# Patient Record
Sex: Female | Born: 1950
Health system: Southern US, Community
[De-identification: ages and names within clinical notes are randomized; demographics above are authoritative.]

## PROBLEM LIST (undated history)

## (undated) DIAGNOSIS — I10 Essential (primary) hypertension: Secondary | ICD-10-CM

## (undated) HISTORY — DX: Essential (primary) hypertension: I10

---

## 2002-02-15 ENCOUNTER — Encounter: Payer: Self-pay | Admitting: Obstetrics and Gynecology

## 2002-02-15 ENCOUNTER — Encounter: Admission: RE | Admit: 2002-02-15 | Discharge: 2002-02-15 | Payer: Self-pay | Admitting: Obstetrics and Gynecology

## 2002-10-15 ENCOUNTER — Observation Stay (HOSPITAL_COMMUNITY): Admission: EM | Admit: 2002-10-15 | Discharge: 2002-10-16 | Payer: Self-pay | Admitting: Emergency Medicine

## 2002-10-15 ENCOUNTER — Encounter: Payer: Self-pay | Admitting: Emergency Medicine

## 2003-05-21 ENCOUNTER — Other Ambulatory Visit: Admission: RE | Admit: 2003-05-21 | Discharge: 2003-05-21 | Payer: Self-pay | Admitting: Obstetrics and Gynecology

## 2004-11-09 ENCOUNTER — Other Ambulatory Visit: Admission: RE | Admit: 2004-11-09 | Discharge: 2004-11-09 | Payer: Self-pay | Admitting: Obstetrics and Gynecology

## 2005-05-18 ENCOUNTER — Ambulatory Visit: Payer: Self-pay | Admitting: Internal Medicine

## 2005-11-16 ENCOUNTER — Ambulatory Visit: Payer: Self-pay | Admitting: Internal Medicine

## 2006-05-17 ENCOUNTER — Ambulatory Visit: Payer: Self-pay | Admitting: Internal Medicine

## 2006-10-30 ENCOUNTER — Ambulatory Visit: Payer: Self-pay | Admitting: Internal Medicine

## 2006-10-31 ENCOUNTER — Ambulatory Visit: Payer: Self-pay | Admitting: Internal Medicine

## 2006-10-31 LAB — CONVERTED CEMR LAB
ALT: 33 units/L (ref 0–40)
AST: 28 units/L (ref 0–37)
Albumin: 3.5 g/dL (ref 3.5–5.2)
Alkaline Phosphatase: 68 units/L (ref 39–117)
BUN: 12 mg/dL (ref 6–23)
Bacteria, UA: NEGATIVE
Basophils Absolute: 0 10*3/uL (ref 0.0–0.1)
Basophils Relative: 0.7 % (ref 0.0–1.0)
Bilirubin Urine: NEGATIVE
Bilirubin, Direct: 0.2 mg/dL (ref 0.0–0.3)
CO2: 27 meq/L (ref 19–32)
Calcium: 9.2 mg/dL (ref 8.4–10.5)
Chloride: 107 meq/L (ref 96–112)
Cholesterol: 278 mg/dL (ref 0–200)
Creatinine, Ser: 1 mg/dL (ref 0.4–1.2)
Crystals: NEGATIVE
Direct LDL: 185.8 mg/dL
Eosinophils Absolute: 0.1 10*3/uL (ref 0.0–0.6)
Eosinophils Relative: 1.1 % (ref 0.0–5.0)
Ferritin: 38.2 ng/mL (ref 10.0–291.0)
GFR calc Af Amer: 74 mL/min
GFR calc non Af Amer: 61 mL/min
Glucose, Bld: 101 mg/dL — ABNORMAL HIGH (ref 70–99)
HCT: 43.5 % (ref 36.0–46.0)
HDL: 56.4 mg/dL (ref >39.0)
Hemoglobin: 15 g/dL (ref 12.0–15.0)
Iron: 106 ug/dL (ref 42–145)
Ketones, ur: NEGATIVE mg/dL
Lymphocytes Relative: 43.8 % (ref 12.0–46.0)
MCHC: 34.6 g/dL (ref 30.0–36.0)
MCV: 85 fL (ref 78.0–100.0)
Monocytes Absolute: 0.5 10*3/uL (ref 0.2–0.7)
Monocytes Relative: 8.1 % (ref 3.0–11.0)
Mucus, UA: NEGATIVE
Neutro Abs: 2.9 10*3/uL (ref 1.4–7.7)
Neutrophils Relative %: 46.3 % (ref 43.0–77.0)
Nitrite: NEGATIVE
Platelets: 230 10*3/uL (ref 150–400)
Potassium: 4.1 meq/L (ref 3.5–5.1)
RBC: 5.11 M/uL (ref 3.87–5.11)
RDW: 12.4 % (ref 11.5–14.6)
Saturation Ratios: 31 % (ref 20.0–50.0)
Sodium: 141 meq/L (ref 135–145)
Specific Gravity, Urine: >=1.03 (ref 1.000–1.03)
TSH: 7.29 microintl units/mL — ABNORMAL HIGH (ref 0.35–5.50)
Total Bilirubin: 1.3 mg/dL — ABNORMAL HIGH (ref 0.3–1.2)
Total CHOL/HDL Ratio: 4.9
Total Protein, Urine: NEGATIVE mg/dL
Total Protein: 6.6 g/dL (ref 6.0–8.3)
Transferrin: 243.9 mg/dL (ref >=212.0)
Triglycerides: 189 mg/dL — ABNORMAL HIGH (ref 0–149)
Urine Glucose: NEGATIVE mg/dL
Urobilinogen, UA: 0.2 (ref 0.0–1.0)
VLDL: 38 mg/dL (ref 0–40)
WBC: 6.2 10*3/uL (ref 4.5–10.5)
pH: 5.5 (ref 5.0–8.0)

## 2006-11-15 ENCOUNTER — Ambulatory Visit: Payer: Self-pay | Admitting: Internal Medicine

## 2007-02-19 ENCOUNTER — Ambulatory Visit: Payer: Self-pay | Admitting: Internal Medicine

## 2007-02-19 LAB — CONVERTED CEMR LAB
ALT: 32 units/L (ref 0–40)
AST: 29 units/L (ref 0–37)
Albumin: 3.7 g/dL (ref 3.5–5.2)
Alkaline Phosphatase: 89 units/L (ref 39–117)
BUN: 12 mg/dL (ref 6–23)
Basophils Absolute: 0 10*3/uL (ref 0.0–0.1)
Basophils Relative: 0.2 % (ref 0.0–1.0)
Bilirubin Urine: NEGATIVE
Bilirubin, Direct: 0.2 mg/dL (ref 0.0–0.3)
CO2: 30 meq/L (ref 19–32)
Calcium: 9.4 mg/dL (ref 8.4–10.5)
Chloride: 108 meq/L (ref 96–112)
Cholesterol: 301 mg/dL (ref 0–200)
Creatinine, Ser: 1.1 mg/dL (ref 0.4–1.2)
Crystals: NEGATIVE
Direct LDL: 203.5 mg/dL
Eosinophils Absolute: 0.1 10*3/uL (ref 0.0–0.6)
Eosinophils Relative: 0.6 % (ref 0.0–5.0)
GFR calc Af Amer: 67 mL/min
GFR calc non Af Amer: 55 mL/min
Glucose, Bld: 122 mg/dL — ABNORMAL HIGH (ref 70–99)
HCT: 43 % (ref 36.0–46.0)
HDL: 52.8 mg/dL (ref >39.0)
Hemoglobin, Urine: NEGATIVE
Hemoglobin: 14.8 g/dL (ref 12.0–15.0)
Iron: 54 ug/dL (ref 42–145)
Ketones, ur: NEGATIVE mg/dL
Lymphocytes Relative: 15.6 % (ref 12.0–46.0)
MCHC: 34.4 g/dL (ref 30.0–36.0)
MCV: 84.6 fL (ref 78.0–100.0)
Monocytes Absolute: 0.7 10*3/uL (ref 0.2–0.7)
Monocytes Relative: 6.6 % (ref 3.0–11.0)
Mucus, UA: NEGATIVE
Neutro Abs: 8.3 10*3/uL — ABNORMAL HIGH (ref 1.4–7.7)
Neutrophils Relative %: 77 % (ref 43.0–77.0)
Nitrite: NEGATIVE
Platelets: 222 10*3/uL (ref 150–400)
Potassium: 4.5 meq/L (ref 3.5–5.1)
RBC / HPF: NONE SEEN
RBC: 5.09 M/uL (ref 3.87–5.11)
RDW: 11.9 % (ref 11.5–14.6)
Saturation Ratios: 14.6 % — ABNORMAL LOW (ref 20.0–50.0)
Sodium: 144 meq/L (ref 135–145)
Specific Gravity, Urine: 1.025 (ref 1.000–1.03)
Total Bilirubin: 1.3 mg/dL — ABNORMAL HIGH (ref 0.3–1.2)
Total CHOL/HDL Ratio: 5.7
Total Protein, Urine: NEGATIVE mg/dL
Total Protein: 7.1 g/dL (ref 6.0–8.3)
Transferrin: 263.6 mg/dL (ref >=212.0)
Triglycerides: 155 mg/dL — ABNORMAL HIGH (ref 0–149)
Urine Glucose: NEGATIVE mg/dL
Urobilinogen, UA: 0.2 (ref 0.0–1.0)
VLDL: 31 mg/dL (ref 0–40)
Vit D, 1,25-Dihydroxy: 22 (ref 20–57)
WBC: 10.8 10*3/uL — ABNORMAL HIGH (ref 4.5–10.5)
pH: 6 (ref 5.0–8.0)

## 2007-08-02 ENCOUNTER — Ambulatory Visit: Payer: Self-pay | Admitting: Internal Medicine

## 2007-08-02 DIAGNOSIS — J209 Acute bronchitis, unspecified: Secondary | ICD-10-CM | POA: Insufficient documentation

## 2007-08-02 DIAGNOSIS — I10 Essential (primary) hypertension: Secondary | ICD-10-CM | POA: Insufficient documentation

## 2007-08-02 DIAGNOSIS — E039 Hypothyroidism, unspecified: Secondary | ICD-10-CM | POA: Insufficient documentation

## 2007-11-01 ENCOUNTER — Ambulatory Visit: Payer: Self-pay | Admitting: Internal Medicine

## 2007-11-01 DIAGNOSIS — D509 Iron deficiency anemia, unspecified: Secondary | ICD-10-CM | POA: Insufficient documentation

## 2007-11-01 DIAGNOSIS — R21 Rash and other nonspecific skin eruption: Secondary | ICD-10-CM | POA: Insufficient documentation

## 2007-11-02 LAB — CONVERTED CEMR LAB
ALT: 30 units/L (ref 0–35)
AST: 29 units/L (ref 0–37)
Albumin: 3.7 g/dL (ref 3.5–5.2)
Alkaline Phosphatase: 69 units/L (ref 39–117)
BUN: 12 mg/dL (ref 6–23)
Basophils Absolute: 0 10*3/uL (ref 0.0–0.1)
Basophils Relative: 0.3 % (ref 0.0–1.0)
Bilirubin Urine: NEGATIVE
Bilirubin, Direct: 0.1 mg/dL (ref 0.0–0.3)
CO2: 30 meq/L (ref 19–32)
Calcium: 9.3 mg/dL (ref 8.4–10.5)
Chloride: 105 meq/L (ref 96–112)
Creatinine, Ser: 0.9 mg/dL (ref 0.4–1.2)
Crystals: NEGATIVE
Eosinophils Absolute: 0.1 10*3/uL (ref 0.0–0.6)
Eosinophils Relative: 1.3 % (ref 0.0–5.0)
GFR calc Af Amer: 84 mL/min
GFR calc non Af Amer: 69 mL/min
Glucose, Bld: 110 mg/dL — ABNORMAL HIGH (ref 70–99)
HCT: 43.5 % (ref 36.0–46.0)
Hemoglobin, Urine: NEGATIVE
Hemoglobin: 14.8 g/dL (ref 12.0–15.0)
Iron: 91 ug/dL (ref 42–145)
Ketones, ur: NEGATIVE mg/dL
Lymphocytes Relative: 36.1 % (ref 12.0–46.0)
MCHC: 34 g/dL (ref 30.0–36.0)
MCV: 85.2 fL (ref 78.0–100.0)
Monocytes Absolute: 0.4 10*3/uL (ref 0.2–0.7)
Monocytes Relative: 7.3 % (ref 3.0–11.0)
Neutro Abs: 2.8 10*3/uL (ref 1.4–7.7)
Neutrophils Relative %: 55 % (ref 43.0–77.0)
Nitrite: NEGATIVE
Platelets: 212 10*3/uL (ref 150–400)
Potassium: 4.1 meq/L (ref 3.5–5.1)
RBC: 5.11 M/uL (ref 3.87–5.11)
RDW: 12.1 % (ref 11.5–14.6)
Sodium: 140 meq/L (ref 135–145)
Specific Gravity, Urine: >=1.03 (ref 1.000–1.03)
TSH: 3.09 microintl units/mL (ref 0.35–5.50)
Total Bilirubin: 0.9 mg/dL (ref 0.3–1.2)
Total Protein, Urine: NEGATIVE mg/dL
Total Protein: 6.8 g/dL (ref 6.0–8.3)
Urine Glucose: NEGATIVE mg/dL
Urobilinogen, UA: 0.2 (ref 0.0–1.0)
WBC: 5.2 10*3/uL (ref 4.5–10.5)
pH: 5 (ref 5.0–8.0)

## 2008-01-11 ENCOUNTER — Ambulatory Visit: Payer: Self-pay | Admitting: Internal Medicine

## 2008-01-11 DIAGNOSIS — R509 Fever, unspecified: Secondary | ICD-10-CM | POA: Insufficient documentation

## 2008-01-11 DIAGNOSIS — R220 Localized swelling, mass and lump, head: Secondary | ICD-10-CM | POA: Insufficient documentation

## 2008-01-11 DIAGNOSIS — R221 Localized swelling, mass and lump, neck: Secondary | ICD-10-CM

## 2008-01-11 DIAGNOSIS — R05 Cough: Secondary | ICD-10-CM

## 2008-01-11 DIAGNOSIS — R059 Cough, unspecified: Secondary | ICD-10-CM | POA: Insufficient documentation

## 2008-01-14 LAB — CONVERTED CEMR LAB
BUN: 11 mg/dL (ref 6–23)
Basophils Absolute: 0 10*3/uL (ref 0.0–0.1)
Basophils Relative: 0 % (ref 0.0–1.0)
CO2: 29 meq/L (ref 19–32)
Calcium: 9.3 mg/dL (ref 8.4–10.5)
Chloride: 105 meq/L (ref 96–112)
Creatinine, Ser: 0.8 mg/dL (ref 0.4–1.2)
Eosinophils Absolute: 0.1 10*3/uL (ref 0.0–0.7)
Eosinophils Relative: 1 % (ref 0.0–5.0)
GFR calc Af Amer: 96 mL/min
GFR calc non Af Amer: 79 mL/min
Glucose, Bld: 99 mg/dL (ref 70–99)
HCT: 44.7 % (ref 36.0–46.0)
Hemoglobin: 15.1 g/dL — ABNORMAL HIGH (ref 12.0–15.0)
Lymphocytes Relative: 33.5 % (ref 12.0–46.0)
MCHC: 33.7 g/dL (ref 30.0–36.0)
MCV: 85.6 fL (ref 78.0–100.0)
Monocytes Absolute: 0.5 10*3/uL (ref 0.1–1.0)
Monocytes Relative: 7.8 % (ref 3.0–12.0)
Neutro Abs: 4 10*3/uL (ref 1.4–7.7)
Neutrophils Relative %: 57.7 % (ref 43.0–77.0)
Platelets: 245 10*3/uL (ref 150–400)
Potassium: 4.4 meq/L (ref 3.5–5.1)
RBC: 5.22 M/uL — ABNORMAL HIGH (ref 3.87–5.11)
RDW: 12.1 % (ref 11.5–14.6)
Sodium: 142 meq/L (ref 135–145)
TSH: 2.31 microintl units/mL (ref 0.35–5.50)
WBC: 6.9 10*3/uL (ref 4.5–10.5)

## 2008-01-24 ENCOUNTER — Ambulatory Visit: Payer: Self-pay | Admitting: Internal Medicine

## 2008-01-25 ENCOUNTER — Encounter: Payer: Self-pay | Admitting: Internal Medicine

## 2008-02-14 ENCOUNTER — Ambulatory Visit: Payer: Self-pay | Admitting: Endocrinology

## 2008-02-14 DIAGNOSIS — E042 Nontoxic multinodular goiter: Secondary | ICD-10-CM | POA: Insufficient documentation

## 2008-02-26 ENCOUNTER — Encounter: Admission: RE | Admit: 2008-02-26 | Discharge: 2008-02-26 | Payer: Self-pay | Admitting: Endocrinology

## 2008-05-21 ENCOUNTER — Encounter (INDEPENDENT_AMBULATORY_CARE_PROVIDER_SITE_OTHER): Payer: Self-pay | Admitting: *Deleted

## 2008-06-04 ENCOUNTER — Ambulatory Visit: Payer: Self-pay | Admitting: Internal Medicine

## 2008-06-09 LAB — CONVERTED CEMR LAB
BUN: 13 mg/dL (ref 6–23)
Bacteria, UA: NEGATIVE
Basophils Absolute: 0 10*3/uL (ref 0.0–0.1)
Basophils Relative: 0.7 % (ref 0.0–3.0)
Bilirubin Urine: NEGATIVE
CO2: 27 meq/L (ref 19–32)
Calcium: 9.2 mg/dL (ref 8.4–10.5)
Chloride: 108 meq/L (ref 96–112)
Creatinine, Ser: 0.9 mg/dL (ref 0.4–1.2)
Crystals: NEGATIVE
Eosinophils Absolute: 0.1 10*3/uL (ref 0.0–0.7)
Eosinophils Relative: 1.3 % (ref 0.0–5.0)
GFR calc Af Amer: 83 mL/min
GFR calc non Af Amer: 69 mL/min
Glucose, Bld: 103 mg/dL — ABNORMAL HIGH (ref 70–99)
HCT: 43.9 % (ref 36.0–46.0)
Hemoglobin, Urine: NEGATIVE
Hemoglobin: 14.9 g/dL (ref 12.0–15.0)
Ketones, ur: NEGATIVE mg/dL
Leukocytes, UA: NEGATIVE
Lymphocytes Relative: 38.6 % (ref 12.0–46.0)
MCHC: 34 g/dL (ref 30.0–36.0)
MCV: 86.2 fL (ref 78.0–100.0)
Monocytes Absolute: 0.6 10*3/uL (ref 0.1–1.0)
Monocytes Relative: 8.8 % (ref 3.0–12.0)
Neutro Abs: 3.3 10*3/uL (ref 1.4–7.7)
Neutrophils Relative %: 50.6 % (ref 43.0–77.0)
Nitrite: NEGATIVE
Platelets: 214 10*3/uL (ref 150–400)
Potassium: 4.2 meq/L (ref 3.5–5.1)
RBC: 5.09 M/uL (ref 3.87–5.11)
RDW: 13 % (ref 11.5–14.6)
Sodium: 139 meq/L (ref 135–145)
Specific Gravity, Urine: 1.02 (ref 1.000–1.03)
TSH: 5.06 microintl units/mL (ref 0.35–5.50)
Total Protein, Urine: NEGATIVE mg/dL
Urine Glucose: NEGATIVE mg/dL
Urobilinogen, UA: 0.2 (ref 0.0–1.0)
WBC: 6.5 10*3/uL (ref 4.5–10.5)
pH: 5.5 (ref 5.0–8.0)

## 2011-02-04 NOTE — Assessment & Plan Note (Signed)
[facility]                           PRIMARY CARE OFFICE NOTE   Kayla Aguirre, Kayla Aguirre                      MRN:          [id number]  DATE:11/15/2006                            DOB:          Aug 16, 1951    The patient is a 60 year old female who presents for a wellness  examination.   Past medical history, family history, social history as per June 17, 2003, note.   MEDICATIONS:  None.   ALLERGIES:  ERYTHROMYCIN UPSETS STOMACH.   REVIEW OF SYSTEMS:  Complains of not being able to loose weight,  problems sleeping, elevated blood pressure most of the time, no  neurologic complaints.  The rest of the 18-point review of systems is  otherwise negative.   PHYSICAL EXAMINATION:  VITAL SIGNS:  Blood pressure 146/90, pulse 78,  temp 99.2, weight 174 pounds.  GENERAL:  She is in no acute distress, looks well.  HEENT:  Moist mucosa.  NECK:  Supple.  No thyromegaly or bruit.  LUNGS:  Clear to auscultation percussion.  No wheezing or rales.  HEART:  Sounds S1 S2.  No murmur, no gallop.  ABDOMEN:  Soft, nontender.  No organomegaly, no mass palpable.  EXTREMITIES:  Without edema.  NEUROLOGIC:  She is alert, oriented, cooperative.  Denies being  depressed.   LABORATORY:  On October 31, 2006, TSH 7.29.  Urinalysis normal.  Cholesterol 278, LDL 185.  Glucose 101.  The rest is unremarkable.   ASSESSMENT/PLAN:  1. Normal wellness examination. Age/health related issues discussed.      Healthy lifestyle discussed.  2. Obtain mammogram, schedule a gynecology appointment with Dr.      Dareen Piano for breast exam and Pap smear.  Bone density scan      discussed.  Calcium with vitamin D.  3. Mild new hypothyroidism.  Start Synthroid 25 mcg daily.  Repeat      labs in 6 weeks.  4. Elevated blood pressure.  Restart HCTZ 12.5 mg daily.  Follow up in      two months.  5. Dyslipidemia.  Not interested in prescription drugs.  She will try      fish oil and  over-the-counter niacin ER 500 mg daily with dinner.      Continue with regular exercise and weight loss.     Georgina Quint. Plotnikov, MD  Electronically Signed    AVP/MedQ  DD: 11/20/2006  DT: 11/20/2006  Job #: [id number]   cc:   Malva Limes, M.D.

## 2011-02-04 NOTE — Discharge Summary (Signed)
NAME:  BERKLEY, WRIGHTSMAN                       ACCOUNT NO.:  [id number]   MEDICAL RECORD NO.:  [id number]                   PATIENT TYPE:  INP   LOCATION:  0343                                 FACILITY:  [facility]   PHYSICIAN:  Veneda Melter, M.D. [facility]               DATE OF BIRTH:  March 03, 1951   DATE OF ADMISSION:  10/15/2002  DATE OF DISCHARGE:  10/16/2002                           DISCHARGE SUMMARY - REFERRING   DISCHARGE DIAGNOSES:  1. Chest pain, resolved, resolved.  2. Elevated blood pressure.   HOSPITAL COURSE:  Ms. Autumn Patty is a 60 year old female who presented to  [facility] emergency room on October 15, 2002 with complaints of left sided  squeezing chest pain.  This sensation radiated in to her back and was  accompanied with nausea and shortness of breath that waxed and waned over  the past 24 hours.  In the emergency room she described this pain as  worsening with movement and with deep breathing.   In the emergency room laboratory studies revealed a D-dimer less than 0.22,  chest x-ray with no active disease.  EKG revealed normal sinus rhythm with  no acute ST/T wave changes.  She was admitted overnight and laboratory work  revealed cardiac enzymes x3 negative and on the morning of discharge she was  pain free.  On the morning of discharge temperature was 97.8, pulse 75,  respirations 20, blood pressure 131/74, oxygen saturations 98% on room air.  Lungs are clear on auscultation bilaterally.  Heart - regular rate and  rhythm, no murmurs, rub or ectopy.  No lower extremity.   The patient was discharged to home on October 16, 2002 with the following  plan:  She is to continue aspirin 325 mg daily, she is to remain NPO until  she is seen in our office this afternoon at 1:30 for a rest stress  Cardiolite.  At that time images will be evaluated for any signs of  ischemia.  At this point she is not to return to work until October 20, 2002.  If her study is abnormal we will then  schedule her for an outpatient  cardiac catheterization however, we also set her up to see Dr. Posey Rea on  October 28, 2002 at 9:30 a.m. to establish a primary care physician.   She is discharged to home in stable condition.   Additional laboratory studies performed at [facility] include:  white  count 8.8, hemoglobin 14.2, hematocrit 40.5, platelets 248, sodium 137,  potassium 4.3, BUN 11, creatinine 1.1.  C-met and CBC were essentially  normal.   The patient is discharged to home in stable condition.     Guy Franco, P.A. [facility]                      Veneda Melter, M.D. [facility]   LB/MEDQ  D:  10/16/2002  T:  10/16/2002  Job:  [id number]  cc:   Georgina Quint. Plotnikov, M.D. [facility]  [address]  [place]  [place] [address]  Fax: 1

## 2011-02-04 NOTE — H&P (Signed)
NAME:  Kayla Aguirre, Kayla Aguirre                       ACCOUNT NO.:  [id number]   MEDICAL RECORD NO.:  [id number]                   PATIENT TYPE:  EMS   LOCATION:  ED                                   [facility]ACILITY[facility]  [facility]   PHYSICIAN:  Charlton Haws, M.D. [facility]              DATE OF BIRTH:  [birth year]   DATE OF ADMISSION:  10/15/2002  DATE OF DISCHARGE:                                HISTORY & PHYSICAL   CHIEF COMPLAINT:  Admission for chest pain, rule out MI.   HISTORY OF PRESENT ILLNESS:  The patient is a 60 year old patient with no  primary care doctor.  She has no previous cardiac history.  While at work  around noon she developed left-sided squeezing sensation in her back with  some chest pain and nausea and shortness of breath that lasted about an  hour.  She had similar feelings this morning.  She opened the window and  went back to bed.  On the way to work she decided to go [facility] and then to the [employer].  The pain is atypical for angina.  It  is related to movement and is worse with a deep breath.  Has a slight  pleuritic component.  She denies any previous injuries in terms of  musculoskeletal problems.  She has no previous history of rheumatic heart  disease.   REVIEW OF SYSTEMS:  Remarkable for some nausea and GERD.  She also denies  being pregnant.   PHYSICAL EXAMINATION:  VITAL SIGNS:  In the ER she is afebrile.  Blood  pressure was 160/63, pulse 76 and regular, saturations 98-99%.  She is in  sinus rhythm.  LUNGS:  Clear.  CARDIAC:  Normal.  There is an S1, S2 without murmur, rub, gallop, or click.  ABDOMEN:  Benign.  EXTREMITIES:  Lower extremities intact pulses.  No edema.   LABORATORIES:  D-dimer is less than 0.22.  Chest x-ray shows no active  disease.  Initial set of enzymes are negative.  EKG shows sinus rhythm and  is normal.   She is happily married and I spoke with her husband here in the ER.  She  does not have a primary care doctor and I  think she would be a good patient  for [employer]. Plotnikov, M.D. [facility] to see since he speaks [place].   IMPRESSION:  Atypical chest pain without high risk factors.  Normal EKG.  She will be admitted for 24-hour observation.  She will be discharged in the  morning to have a stress test at our office.  If this is negative she can  have primary care follow-up, hopefully with Georgina Quint. Plotnikov, M.D. [facility].  Charlton Haws, M.D. [facility]    PN/MEDQ  D:  10/15/2002  T:  10/15/2002  Job:  [id number]

## 2011-06-13 ENCOUNTER — Encounter: Payer: Self-pay | Admitting: Internal Medicine

## 2011-06-13 ENCOUNTER — Ambulatory Visit (INDEPENDENT_AMBULATORY_CARE_PROVIDER_SITE_OTHER): Payer: BC Managed Care – PPO | Admitting: Internal Medicine

## 2011-06-13 VITALS — BP 160/104 | HR 97 | Temp 98.2°F | Wt 179.0 lb

## 2011-06-13 DIAGNOSIS — D509 Iron deficiency anemia, unspecified: Secondary | ICD-10-CM

## 2011-06-13 DIAGNOSIS — R5383 Other fatigue: Secondary | ICD-10-CM

## 2011-06-13 DIAGNOSIS — I10 Essential (primary) hypertension: Secondary | ICD-10-CM

## 2011-06-13 DIAGNOSIS — E039 Hypothyroidism, unspecified: Secondary | ICD-10-CM

## 2011-06-13 DIAGNOSIS — R5381 Other malaise: Secondary | ICD-10-CM

## 2011-06-13 MED ORDER — LOSARTAN POTASSIUM 100 MG PO TABS: 100.0000 mg | ORAL_TABLET | Freq: Every day | ORAL | Status: DC

## 2011-06-13 MED ORDER — VITAMIN D 1000 UNITS PO TABS: 1000.0000 [IU] | ORAL_TABLET | Freq: Every day | ORAL | Status: DC

## 2011-06-13 NOTE — Progress Notes (Signed)
  Subjective:    Patient ID: Kayla Aguirre, female    DOB: 10-21-1950, 60 y.o.   MRN: [id number]  HPI  The patient presents for a follow-up of  chronic hypertension not controlled with medicines: SBP = 200 lately F/u hypothyroidism and anemia    Review of Systems  Constitutional: Negative for chills, activity change, appetite change, fatigue and unexpected weight change.  HENT: Negative for congestion, mouth sores and sinus pressure.   Eyes: Negative for visual disturbance.  Respiratory: Negative for cough and chest tightness.   Gastrointestinal: Negative for nausea and abdominal pain.  Genitourinary: Negative for frequency, difficulty urinating and vaginal pain.  Musculoskeletal: Negative for back pain and gait problem.  Skin: Negative for pallor and rash.  Neurological: Negative for dizziness, tremors, weakness, numbness and headaches.  Psychiatric/Behavioral: Negative for confusion and sleep disturbance. The patient is not nervous/anxious.        Objective:   Physical Exam  Constitutional: She appears well-developed and well-nourished. No distress.  HENT:  Head: Normocephalic.  Right Ear: External ear normal.  Left Ear: External ear normal.  Nose: Nose normal.  Mouth/Throat: Oropharynx is clear and moist.  Eyes: Conjunctivae are normal. Pupils are equal, round, and reactive to light. Right eye exhibits no discharge. Left eye exhibits no discharge.  Neck: Normal range of motion. Neck supple. No JVD present. No tracheal deviation present. No thyromegaly present.  Cardiovascular: Normal rate, regular rhythm and normal heart sounds.   Pulmonary/Chest: No stridor. No respiratory distress. She has no wheezes.  Abdominal: Soft. Bowel sounds are normal. She exhibits no distension and no mass. There is no tenderness. There is no rebound and no guarding.  Musculoskeletal: She exhibits no edema and no tenderness.  Lymphadenopathy:    She has no cervical adenopathy.  Neurological:  She displays normal reflexes. No cranial nerve deficit. She exhibits normal muscle tone. Coordination normal.  Skin: No rash noted. No erythema.  Psychiatric: She has a normal mood and affect. Her behavior is normal. Judgment and thought content normal.          Assessment & Plan:

## 2011-06-13 NOTE — Assessment & Plan Note (Signed)
Check labs 

## 2011-06-13 NOTE — Assessment & Plan Note (Signed)
Start Losartan 

## 2011-06-14 ENCOUNTER — Other Ambulatory Visit: Payer: Self-pay | Admitting: Internal Medicine

## 2011-06-14 ENCOUNTER — Other Ambulatory Visit (INDEPENDENT_AMBULATORY_CARE_PROVIDER_SITE_OTHER): Payer: BC Managed Care – PPO

## 2011-06-14 DIAGNOSIS — I10 Essential (primary) hypertension: Secondary | ICD-10-CM

## 2011-06-14 DIAGNOSIS — R5383 Other fatigue: Secondary | ICD-10-CM

## 2011-06-14 DIAGNOSIS — R5381 Other malaise: Secondary | ICD-10-CM

## 2011-06-14 LAB — LIPID PANEL
Cholesterol: 276 mg/dL — ABNORMAL HIGH (ref 0–200)
HDL: 54.8 mg/dL (ref >39.00)
VLDL: 27.2 mg/dL (ref 0.0–40.0)

## 2011-06-14 LAB — URINALYSIS, ROUTINE W REFLEX MICROSCOPIC
Bilirubin Urine: NEGATIVE
Ketones, ur: NEGATIVE
Specific Gravity, Urine: 1.02 (ref 1.000–1.030)
Urine Glucose: NEGATIVE
pH: 5.5 (ref 5.0–8.0)

## 2011-06-14 LAB — COMPREHENSIVE METABOLIC PANEL
AST: 31 U/L (ref 0–37)
Albumin: 3.9 g/dL (ref 3.5–5.2)
Alkaline Phosphatase: 79 U/L (ref 39–117)
BUN: 15 mg/dL (ref 6–23)
Calcium: 9 mg/dL (ref 8.4–10.5)
Chloride: 107 mEq/L (ref 96–112)
Glucose, Bld: 100 mg/dL — ABNORMAL HIGH (ref 70–99)
Potassium: 4.1 mEq/L (ref 3.5–5.1)
Sodium: 141 mEq/L (ref 135–145)
Total Protein: 7.2 g/dL (ref 6.0–8.3)

## 2011-06-14 LAB — CBC WITH DIFFERENTIAL/PLATELET
Basophils Relative: 0.6 % (ref 0.0–3.0)
Eosinophils Relative: 0.8 % (ref 0.0–5.0)
Lymphocytes Relative: 40.2 % (ref 12.0–46.0)
MCV: 85.2 fl (ref 78.0–100.0)
Monocytes Absolute: 0.5 10*3/uL (ref 0.1–1.0)
Monocytes Relative: 8.1 % (ref 3.0–12.0)
Neutrophils Relative %: 50.3 % (ref 43.0–77.0)
Platelets: 215 10*3/uL (ref 150.0–400.0)
RBC: 5.18 Mil/uL — ABNORMAL HIGH (ref 3.87–5.11)
WBC: 6.6 10*3/uL (ref 4.5–10.5)

## 2011-06-14 LAB — LDL CHOLESTEROL, DIRECT: Direct LDL: 188.1 mg/dL

## 2011-06-14 LAB — TSH: TSH: 5.51 u[IU]/mL — ABNORMAL HIGH (ref 0.35–5.50)

## 2011-06-15 ENCOUNTER — Telehealth: Payer: Self-pay | Admitting: Internal Medicine

## 2011-06-15 LAB — VITAMIN D 25 HYDROXY (VIT D DEFICIENCY, FRACTURES): Vit D, 25-Hydroxy: 28 ng/mL — ABNORMAL LOW (ref 30–89)

## 2011-06-15 MED ORDER — VITAMIN D3 1.25 MG (50000 UT) PO CAPS: 1.0000 | ORAL_CAPSULE | ORAL | Status: DC

## 2011-06-15 MED ORDER — VITAMIN B-12 1000 MCG SL SUBL: 1.0000 | SUBLINGUAL_TABLET | Freq: Every day | SUBLINGUAL | Status: DC

## 2011-06-15 MED ORDER — LEVOTHYROXINE SODIUM 25 MCG PO TABS: 25.0000 ug | ORAL_TABLET | Freq: Every day | ORAL | Status: DC

## 2011-06-15 NOTE — Telephone Encounter (Signed)
letter

## 2011-06-15 NOTE — Telephone Encounter (Signed)
Letter sent - see meds ROV

## 2011-07-19 ENCOUNTER — Other Ambulatory Visit (INDEPENDENT_AMBULATORY_CARE_PROVIDER_SITE_OTHER): Payer: BC Managed Care – PPO

## 2011-07-19 ENCOUNTER — Ambulatory Visit (INDEPENDENT_AMBULATORY_CARE_PROVIDER_SITE_OTHER): Payer: BC Managed Care – PPO | Admitting: Internal Medicine

## 2011-07-19 ENCOUNTER — Encounter: Payer: Self-pay | Admitting: Internal Medicine

## 2011-07-19 ENCOUNTER — Telehealth: Payer: Self-pay | Admitting: Internal Medicine

## 2011-07-19 VITALS — BP 170/100 | HR 85 | Temp 98.4°F | Ht 65.0 in | Wt 174.0 lb

## 2011-07-19 DIAGNOSIS — R5381 Other malaise: Secondary | ICD-10-CM

## 2011-07-19 DIAGNOSIS — E785 Hyperlipidemia, unspecified: Secondary | ICD-10-CM

## 2011-07-19 DIAGNOSIS — D509 Iron deficiency anemia, unspecified: Secondary | ICD-10-CM

## 2011-07-19 DIAGNOSIS — I10 Essential (primary) hypertension: Secondary | ICD-10-CM

## 2011-07-19 DIAGNOSIS — R5383 Other fatigue: Secondary | ICD-10-CM

## 2011-07-19 DIAGNOSIS — E042 Nontoxic multinodular goiter: Secondary | ICD-10-CM

## 2011-07-19 DIAGNOSIS — E039 Hypothyroidism, unspecified: Secondary | ICD-10-CM

## 2011-07-19 DIAGNOSIS — E538 Deficiency of other specified B group vitamins: Secondary | ICD-10-CM

## 2011-07-19 LAB — URINALYSIS
Bilirubin Urine: NEGATIVE
Hgb urine dipstick: NEGATIVE
Nitrite: NEGATIVE
Total Protein, Urine: NEGATIVE

## 2011-07-19 MED ORDER — CYANOCOBALAMIN 1000 MCG/ML IJ SOLN
1000.0000 ug | Freq: Once | INTRAMUSCULAR | Status: AC
  Administered 2011-07-19: 1000 ug via INTRAMUSCULAR

## 2011-07-19 MED ORDER — LOSARTAN POTASSIUM 100 MG PO TABS: 100.0000 mg | ORAL_TABLET | Freq: Every day | ORAL | Status: DC

## 2011-07-19 MED ORDER — VITAMIN D 1000 UNITS PO TABS: 1000.0000 [IU] | ORAL_TABLET | Freq: Every day | ORAL | Status: AC

## 2011-07-19 MED ORDER — VITAMIN D3 1.25 MG (50000 UT) PO CAPS: 1.0000 | ORAL_CAPSULE | ORAL | Status: DC

## 2011-07-19 MED ORDER — VITAMIN B-12 1000 MCG SL SUBL: 1.0000 | SUBLINGUAL_TABLET | Freq: Every day | SUBLINGUAL | Status: DC

## 2011-07-19 MED ORDER — LEVOTHYROXINE SODIUM 25 MCG PO TABS: 25.0000 ug | ORAL_TABLET | Freq: Every day | ORAL | Status: DC

## 2011-07-19 NOTE — Assessment & Plan Note (Addendum)
Continue with current prescription therapy as reflected on the Med list.  

## 2011-07-19 NOTE — Assessment & Plan Note (Signed)
Continue with current prescription therapy as reflected on the Med list.  

## 2011-07-19 NOTE — Progress Notes (Signed)
  Subjective:    Patient ID: Kayla Aguirre, female    DOB: 1951-06-04, 60 y.o.   MRN: [id number]  HPI  F/u HTN. Was taking 1/2 cozaar at home. Did not take any x 2 d F/u B12 and vit D def: not taking meds F/u anemia  Review of Systems  Constitutional: Negative for fever, chills, diaphoresis, activity change, appetite change and unexpected weight change.  HENT: Negative for hearing loss, ear pain, facial swelling, rhinorrhea, sneezing, trouble swallowing, neck pain, neck stiffness, postnasal drip, sinus pressure and tinnitus.   Eyes: Negative for pain, discharge, redness, itching and visual disturbance.  Respiratory: Negative for cough, chest tightness, shortness of breath, wheezing and stridor.   Cardiovascular: Negative for chest pain, palpitations and leg swelling.  Gastrointestinal: Negative for nausea, diarrhea, constipation, blood in stool, abdominal distention, anal bleeding and rectal pain.  Genitourinary: Negative for dysuria, frequency, hematuria, flank pain, difficulty urinating and genital sores.  Musculoskeletal: Negative for back pain, arthralgias and gait problem.  Skin: Negative.  Negative for rash.  Neurological: Negative for dizziness, tremors, speech difficulty, weakness, numbness and headaches.  Hematological: Negative for adenopathy. Does not bruise/bleed easily.  Psychiatric/Behavioral: Negative for behavioral problems, sleep disturbance, dysphoric mood and decreased concentration. The patient is not nervous/anxious.        Objective:   Physical Exam  Constitutional: She appears well-developed and well-nourished. No distress.  HENT:  Head: Normocephalic.  Right Ear: External ear normal.  Left Ear: External ear normal.  Nose: Nose normal.  Mouth/Throat: Oropharynx is clear and moist.  Eyes: Conjunctivae are normal. Pupils are equal, round, and reactive to light. Right eye exhibits no discharge. Left eye exhibits no discharge.  Neck: Normal range of motion. Neck  supple. No JVD present. No tracheal deviation present. No thyromegaly present.  Cardiovascular: Normal rate, regular rhythm and normal heart sounds.   Pulmonary/Chest: No stridor. No respiratory distress. She has no wheezes.  Abdominal: Soft. Bowel sounds are normal. She exhibits no distension and no mass. There is no tenderness. There is no rebound and no guarding.  Musculoskeletal: She exhibits no edema and no tenderness.  Lymphadenopathy:    She has no cervical adenopathy.  Neurological: She displays normal reflexes. No cranial nerve deficit. She exhibits normal muscle tone. Coordination normal.  Skin: No rash noted. No erythema.  Psychiatric: She has a normal mood and affect. Her behavior is normal. Judgment and thought content normal.     Lab Results  Component Value Date   WBC 6.6 06/14/2011   HGB 14.9 06/14/2011   HCT 44.1 06/14/2011   PLT 215.0 06/14/2011   GLUCOSE 100* 06/14/2011   CHOL 276* 06/14/2011   TRIG 136.0 06/14/2011   HDL 54.80 06/14/2011   LDLDIRECT 188.1 06/14/2011   ALT 43* 06/14/2011   AST 31 06/14/2011   NA 141 06/14/2011   K 4.1 06/14/2011   CL 107 06/14/2011   CREATININE 0.9 06/14/2011   BUN 15 06/14/2011   CO2 26 06/14/2011   TSH 5.51* 06/14/2011        Assessment & Plan:   Risks associated with treatment noncompliance were discussed. Compliance was encouraged.

## 2011-07-19 NOTE — Telephone Encounter (Signed)
Kayla Aguirre, please, inform patient that UA is normal  Thx

## 2011-07-20 NOTE — Telephone Encounter (Signed)
Pt informed

## 2011-08-18 ENCOUNTER — Other Ambulatory Visit (INDEPENDENT_AMBULATORY_CARE_PROVIDER_SITE_OTHER): Payer: BC Managed Care – PPO

## 2011-08-18 ENCOUNTER — Encounter: Payer: Self-pay | Admitting: Internal Medicine

## 2011-08-18 ENCOUNTER — Ambulatory Visit (INDEPENDENT_AMBULATORY_CARE_PROVIDER_SITE_OTHER): Payer: BC Managed Care – PPO | Admitting: Internal Medicine

## 2011-08-18 VITALS — BP 148/100 | HR 84 | Temp 98.4°F | Resp 16 | Wt 177.0 lb

## 2011-08-18 DIAGNOSIS — E559 Vitamin D deficiency, unspecified: Secondary | ICD-10-CM

## 2011-08-18 DIAGNOSIS — E538 Deficiency of other specified B group vitamins: Secondary | ICD-10-CM

## 2011-08-18 DIAGNOSIS — E539 Vitamin B deficiency, unspecified: Secondary | ICD-10-CM

## 2011-08-18 DIAGNOSIS — D509 Iron deficiency anemia, unspecified: Secondary | ICD-10-CM

## 2011-08-18 DIAGNOSIS — E039 Hypothyroidism, unspecified: Secondary | ICD-10-CM

## 2011-08-18 DIAGNOSIS — E785 Hyperlipidemia, unspecified: Secondary | ICD-10-CM

## 2011-08-18 DIAGNOSIS — I10 Essential (primary) hypertension: Secondary | ICD-10-CM

## 2011-08-18 LAB — HEPATIC FUNCTION PANEL
ALT: 40 U/L — ABNORMAL HIGH (ref 0–35)
AST: 29 U/L (ref 0–37)
Albumin: 4 g/dL (ref 3.5–5.2)

## 2011-08-18 LAB — BASIC METABOLIC PANEL
BUN: 13 mg/dL (ref 6–23)
Calcium: 9.2 mg/dL (ref 8.4–10.5)
GFR: 69.79 mL/min (ref >60.00)
Glucose, Bld: 105 mg/dL — ABNORMAL HIGH (ref 70–99)
Sodium: 141 mEq/L (ref 135–145)

## 2011-08-18 LAB — T3, FREE: T3, Free: 2.9 pg/mL (ref 2.3–4.2)

## 2011-08-18 MED ORDER — VALSARTAN 320 MG PO TABS: 320.0000 mg | ORAL_TABLET | Freq: Every day | ORAL | Status: DC

## 2011-08-18 MED ORDER — AMOXICILLIN 500 MG PO CAPS: 500.0000 mg | ORAL_CAPSULE | Freq: Two times a day (BID) | ORAL | Status: AC

## 2011-08-18 MED ORDER — AMOXICILLIN 500 MG PO CAPS: 500.0000 mg | ORAL_CAPSULE | Freq: Two times a day (BID) | ORAL | Status: DC

## 2011-08-18 NOTE — Assessment & Plan Note (Signed)
She is not taking B12 Risks associated with treatment noncompliance were discussed. Compliance was encouraged.

## 2011-08-18 NOTE — Assessment & Plan Note (Signed)
Labs were reviewed.

## 2011-08-18 NOTE — Progress Notes (Signed)
Addended by: Merrilyn Puma on: 08/18/2011 12:57 PM   Modules accepted: Orders, Medications

## 2011-08-18 NOTE — Assessment & Plan Note (Signed)
2012 She did not take Rx Vit D Risks associated with treatment noncompliance were discussed. Compliance was encouraged. Take daily B12

## 2011-08-18 NOTE — Progress Notes (Signed)
  Subjective:    Patient ID: Kayla Aguirre, female    DOB: 02-03-1951, 60 y.o.   MRN: [id number]  HPI  The patient presents for a follow-up of  chronic hypertension, chronic dyslipidemia, B12 and vit D def. She has not been taking medicines due her fears of meds    Review of Systems  Constitutional: Negative for chills, activity change, appetite change, fatigue and unexpected weight change.  HENT: Negative for congestion, mouth sores and sinus pressure.   Eyes: Negative for visual disturbance.  Respiratory: Negative for cough and chest tightness.   Gastrointestinal: Negative for nausea and abdominal pain.  Genitourinary: Negative for frequency, difficulty urinating and vaginal pain.  Musculoskeletal: Negative for back pain and gait problem.  Skin: Negative for pallor and rash.  Neurological: Negative for dizziness, tremors, weakness, numbness and headaches.  Psychiatric/Behavioral: Negative for confusion and sleep disturbance.       Objective:   Physical Exam  Constitutional: She appears well-developed and well-nourished. No distress.  HENT:  Head: Normocephalic.  Right Ear: External ear normal.  Left Ear: External ear normal.  Nose: Nose normal.  Mouth/Throat: Oropharynx is clear and moist.  Eyes: Conjunctivae are normal. Pupils are equal, round, and reactive to light. Right eye exhibits no discharge. Left eye exhibits no discharge.  Neck: Normal range of motion. Neck supple. No JVD present. No tracheal deviation present. No thyromegaly present.  Cardiovascular: Normal rate, regular rhythm and normal heart sounds.   Pulmonary/Chest: No stridor. No respiratory distress. She has no wheezes.  Abdominal: Soft. Bowel sounds are normal. She exhibits no distension and no mass. There is no tenderness. There is no rebound and no guarding.  Musculoskeletal: She exhibits no edema and no tenderness.  Lymphadenopathy:    She has no cervical adenopathy.  Neurological: She displays normal  reflexes. No cranial nerve deficit. She exhibits normal muscle tone. Coordination normal.  Skin: No rash noted. No erythema.  Psychiatric: She has a normal mood and affect. Her behavior is normal. Judgment and thought content normal.     Lab Results  Component Value Date   WBC 6.6 06/14/2011   HGB 14.9 06/14/2011   HCT 44.1 06/14/2011   PLT 215.0 06/14/2011   GLUCOSE 100* 06/14/2011   CHOL 276* 06/14/2011   TRIG 136.0 06/14/2011   HDL 54.80 06/14/2011   LDLDIRECT 188.1 06/14/2011   ALT 43* 06/14/2011   AST 31 06/14/2011   NA 141 06/14/2011   K 4.1 06/14/2011   CL 107 06/14/2011   CREATININE 0.9 06/14/2011   BUN 15 06/14/2011   CO2 26 06/14/2011   TSH 5.51* 06/14/2011        Assessment & Plan:

## 2011-08-18 NOTE — Patient Instructions (Signed)
Take your blood pressure meds daily.

## 2011-08-18 NOTE — Assessment & Plan Note (Signed)
Continue with current prescription therapy as reflected on the Med list. Risks associated with treatment noncompliance were discussed. Compliance was encouraged.  

## 2011-08-18 NOTE — Assessment & Plan Note (Signed)
  On diet  

## 2011-08-18 NOTE — Assessment & Plan Note (Signed)
Chronic - not taking Rx 2012 Risks associated with treatment noncompliance were discussed. Compliance was encouraged. Restart Rx

## 2011-08-19 LAB — VITAMIN D 25 HYDROXY (VIT D DEFICIENCY, FRACTURES): Vit D, 25-Hydroxy: 25 ng/mL — ABNORMAL LOW (ref 30–89)

## 2011-09-08 ENCOUNTER — Encounter: Payer: Self-pay | Admitting: Internal Medicine

## 2011-09-08 ENCOUNTER — Ambulatory Visit (INDEPENDENT_AMBULATORY_CARE_PROVIDER_SITE_OTHER): Payer: BC Managed Care – PPO | Admitting: Internal Medicine

## 2011-09-08 VITALS — BP 158/100 | HR 80 | Temp 98.4°F | Resp 16 | Wt 177.0 lb

## 2011-09-08 DIAGNOSIS — I10 Essential (primary) hypertension: Secondary | ICD-10-CM

## 2011-09-08 DIAGNOSIS — E039 Hypothyroidism, unspecified: Secondary | ICD-10-CM

## 2011-09-08 MED ORDER — AMLODIPINE BESYLATE 5 MG PO TABS: 5.0000 mg | ORAL_TABLET | Freq: Every day | ORAL | Status: DC

## 2011-09-08 NOTE — Assessment & Plan Note (Addendum)
Worse - 12/12 Abd [place] Added Norvasc 5 mg; cont Cozaar

## 2011-09-08 NOTE — Progress Notes (Signed)
  Subjective:    Patient ID: Kayla Aguirre, female    DOB: 1951-01-20, 60 y.o.   MRN: [id number]  HPI   C/o elev BP x 3 d 180/100. She started to take Losartan daily x 10 d Review of Systems  Constitutional: Negative for diaphoresis and fatigue.  HENT: Negative for congestion and rhinorrhea.   Respiratory: Negative for cough and shortness of breath.   Cardiovascular: Negative for chest pain, palpitations and leg swelling.  Genitourinary: Negative for dysuria.  Musculoskeletal: Negative for gait problem.  Skin: Negative for rash.  Neurological: Negative for dizziness, light-headedness and headaches.  Psychiatric/Behavioral: Negative for sleep disturbance. The patient is not nervous/anxious.        Objective:   Physical Exam  Constitutional: She appears well-developed. No distress.  HENT:  Head: Normocephalic.  Right Ear: External ear normal.  Left Ear: External ear normal.  Nose: Nose normal.  Mouth/Throat: Oropharynx is clear and moist.  Eyes: Conjunctivae are normal. Pupils are equal, round, and reactive to light. Right eye exhibits no discharge. Left eye exhibits no discharge.  Neck: Normal range of motion. Neck supple. No JVD present. No tracheal deviation present. No thyromegaly present.  Cardiovascular: Normal rate, regular rhythm and normal heart sounds.   Pulmonary/Chest: No stridor. No respiratory distress. She has no wheezes.  Abdominal: Soft. Bowel sounds are normal. She exhibits no distension and no mass. There is no tenderness. There is no rebound and no guarding.  Musculoskeletal: She exhibits no edema and no tenderness.  Lymphadenopathy:    She has no cervical adenopathy.  Neurological: She displays normal reflexes. No cranial nerve deficit. She exhibits normal muscle tone. Coordination normal.  Skin: No rash noted. No erythema.  Psychiatric: She has a normal mood and affect. Her behavior is normal. Judgment and thought content normal.    Lab Results    Component Value Date   WBC 6.6 06/14/2011   HGB 14.9 06/14/2011   HCT 44.1 06/14/2011   PLT 215.0 06/14/2011   GLUCOSE 105* 08/18/2011   CHOL 276* 06/14/2011   TRIG 136.0 06/14/2011   HDL 54.80 06/14/2011   LDLDIRECT 188.1 06/14/2011   ALT 40* 08/18/2011   AST 29 08/18/2011   NA 141 08/18/2011   K 4.5 08/18/2011   CL 107 08/18/2011   CREATININE 0.9 08/18/2011   BUN 13 08/18/2011   CO2 28 08/18/2011   TSH 2.63 08/18/2011         Assessment & Plan:

## 2011-09-08 NOTE — Assessment & Plan Note (Signed)
Doing well. Last TSH was OK

## 2011-09-08 NOTE — Patient Instructions (Signed)
Call if BP is up 

## 2011-09-14 ENCOUNTER — Ambulatory Visit
Admission: RE | Admit: 2011-09-14 | Discharge: 2011-09-14 | Disposition: A | Discharge status: Home / Self Care | Payer: BC Managed Care – PPO | Source: Ambulatory Visit | Attending: Internal Medicine | Admitting: Internal Medicine

## 2011-11-21 ENCOUNTER — Ambulatory Visit: Payer: BC Managed Care – PPO | Admitting: Internal Medicine

## 2011-11-28 ENCOUNTER — Ambulatory Visit: Payer: BC Managed Care – PPO | Admitting: Internal Medicine

## 2012-01-25 ENCOUNTER — Ambulatory Visit (INDEPENDENT_AMBULATORY_CARE_PROVIDER_SITE_OTHER): Payer: BC Managed Care – PPO | Admitting: Internal Medicine

## 2012-01-25 ENCOUNTER — Encounter: Payer: Self-pay | Admitting: Internal Medicine

## 2012-01-25 VITALS — BP 160/108 | HR 80 | Temp 97.8°F | Resp 16 | Wt 180.0 lb

## 2012-01-25 DIAGNOSIS — D509 Iron deficiency anemia, unspecified: Secondary | ICD-10-CM

## 2012-01-25 DIAGNOSIS — E785 Hyperlipidemia, unspecified: Secondary | ICD-10-CM

## 2012-01-25 DIAGNOSIS — E538 Deficiency of other specified B group vitamins: Secondary | ICD-10-CM

## 2012-01-25 DIAGNOSIS — I10 Essential (primary) hypertension: Secondary | ICD-10-CM

## 2012-01-25 DIAGNOSIS — E039 Hypothyroidism, unspecified: Secondary | ICD-10-CM

## 2012-01-25 DIAGNOSIS — E559 Vitamin D deficiency, unspecified: Secondary | ICD-10-CM

## 2012-01-25 MED ORDER — LORATADINE 10 MG PO TABS: 10.0000 mg | ORAL_TABLET | Freq: Every day | ORAL | Status: DC

## 2012-01-25 NOTE — Assessment & Plan Note (Signed)
Not taking Rx TSH

## 2012-01-25 NOTE — Assessment & Plan Note (Signed)
Recheck CBC. 

## 2012-01-25 NOTE — Assessment & Plan Note (Signed)
Continue with current prescription therapy as reflected on the Med list.  

## 2012-01-25 NOTE — Assessment & Plan Note (Signed)
Check vit D 

## 2012-01-25 NOTE — Progress Notes (Signed)
Patient ID: Kayla Aguirre, female   DOB: 1951/05/01, 61 y.o.   MRN: [id number]  Subjective:    Patient ID: Kayla Aguirre, female    DOB: July 25, 1951, 61 y.o.   MRN: [id number]  Hypertension Associated symptoms include headaches. Pertinent negatives include no chest pain, palpitations or shortness of breath.  Headache  Pertinent negatives include no coughing, dizziness or rhinorrhea. Her past medical history is significant for hypertension.     C/o elev BP at times 180/100. She started to take Losartan 1/2-1   BP Readings from Last 3 Encounters:  01/25/12 160/108  09/08/11 158/100  08/18/11 148/100   Wt Readings from Last 3 Encounters:  01/25/12 180 lb (81.647 kg)  09/08/11 177 lb (80.287 kg)  08/18/11 177 lb (80.287 kg)     Review of Systems  Constitutional: Negative for diaphoresis and fatigue.  HENT: Negative for congestion and rhinorrhea.   Respiratory: Negative for cough and shortness of breath.   Cardiovascular: Negative for chest pain, palpitations and leg swelling.  Genitourinary: Negative for dysuria.  Musculoskeletal: Negative for gait problem.  Skin: Negative for rash.  Neurological: Positive for headaches. Negative for dizziness and light-headedness.  Psychiatric/Behavioral: Negative for sleep disturbance. The patient is not nervous/anxious.        Objective:   Physical Exam  Constitutional: She appears well-developed. No distress.  HENT:  Head: Normocephalic.  Right Ear: External ear normal.  Left Ear: External ear normal.  Nose: Nose normal.  Mouth/Throat: Oropharynx is clear and moist.  Eyes: Conjunctivae are normal. Pupils are equal, round, and reactive to light. Right eye exhibits no discharge. Left eye exhibits no discharge.  Neck: Normal range of motion. Neck supple. No JVD present. No tracheal deviation present. No thyromegaly present.  Cardiovascular: Normal rate, regular rhythm and normal heart sounds.   Pulmonary/Chest: No stridor. No  respiratory distress. She has no wheezes.  Abdominal: Soft. Bowel sounds are normal. She exhibits no distension and no mass. There is no tenderness. There is no rebound and no guarding.  Musculoskeletal: She exhibits no edema and no tenderness.  Lymphadenopathy:    She has no cervical adenopathy.  Neurological: She displays normal reflexes. No cranial nerve deficit. She exhibits normal muscle tone. Coordination normal.  Skin: No rash noted. No erythema.  Psychiatric: She has a normal mood and affect. Her behavior is normal. Judgment and thought content normal.    Lab Results  Component Value Date   WBC 6.6 06/14/2011   HGB 14.9 06/14/2011   HCT 44.1 06/14/2011   PLT 215.0 06/14/2011   GLUCOSE 105* 08/18/2011   CHOL 276* 06/14/2011   TRIG 136.0 06/14/2011   HDL 54.80 06/14/2011   LDLDIRECT 188.1 06/14/2011   ALT 40* 08/18/2011   AST 29 08/18/2011   NA 141 08/18/2011   K 4.5 08/18/2011   CL 107 08/18/2011   CREATININE 0.9 08/18/2011   BUN 13 08/18/2011   CO2 28 08/18/2011   TSH 2.63 08/18/2011         Assessment & Plan:

## 2012-01-25 NOTE — Assessment & Plan Note (Signed)
Check labs 

## 2012-01-31 ENCOUNTER — Telehealth: Payer: Self-pay | Admitting: Internal Medicine

## 2012-01-31 ENCOUNTER — Other Ambulatory Visit (INDEPENDENT_AMBULATORY_CARE_PROVIDER_SITE_OTHER): Payer: BC Managed Care – PPO

## 2012-01-31 DIAGNOSIS — E039 Hypothyroidism, unspecified: Secondary | ICD-10-CM

## 2012-01-31 DIAGNOSIS — E785 Hyperlipidemia, unspecified: Secondary | ICD-10-CM

## 2012-01-31 DIAGNOSIS — I10 Essential (primary) hypertension: Secondary | ICD-10-CM

## 2012-01-31 DIAGNOSIS — E538 Deficiency of other specified B group vitamins: Secondary | ICD-10-CM

## 2012-01-31 DIAGNOSIS — E559 Vitamin D deficiency, unspecified: Secondary | ICD-10-CM

## 2012-01-31 DIAGNOSIS — D509 Iron deficiency anemia, unspecified: Secondary | ICD-10-CM

## 2012-01-31 LAB — BASIC METABOLIC PANEL
BUN: 12 mg/dL (ref 6–23)
CO2: 26 mEq/L (ref 19–32)
GFR: 67.04 mL/min (ref >60.00)
Glucose, Bld: 113 mg/dL — ABNORMAL HIGH (ref 70–99)
Potassium: 4.3 mEq/L (ref 3.5–5.1)

## 2012-01-31 LAB — LIPID PANEL
Cholesterol: 300 mg/dL — ABNORMAL HIGH (ref 0–200)
HDL: 57.5 mg/dL (ref >39.00)
Total CHOL/HDL Ratio: 5
Triglycerides: 262 mg/dL — ABNORMAL HIGH (ref 0.0–149.0)
VLDL: 52.4 mg/dL — ABNORMAL HIGH (ref 0.0–40.0)

## 2012-01-31 LAB — HEPATIC FUNCTION PANEL
Albumin: 3.8 g/dL (ref 3.5–5.2)
Alkaline Phosphatase: 89 U/L (ref 39–117)
Total Protein: 7.3 g/dL (ref 6.0–8.3)

## 2012-01-31 LAB — CBC WITH DIFFERENTIAL/PLATELET
Basophils Relative: 0.6 % (ref 0.0–3.0)
Eosinophils Relative: 1.6 % (ref 0.0–5.0)
HCT: 44.6 % (ref 36.0–46.0)
MCV: 85.6 fl (ref 78.0–100.0)
Monocytes Absolute: 0.5 10*3/uL (ref 0.1–1.0)
Monocytes Relative: 7.1 % (ref 3.0–12.0)
Neutrophils Relative %: 50.3 % (ref 43.0–77.0)
RBC: 5.21 Mil/uL — ABNORMAL HIGH (ref 3.87–5.11)
WBC: 6.8 10*3/uL (ref 4.5–10.5)

## 2012-01-31 LAB — TSH: TSH: 5.67 u[IU]/mL — ABNORMAL HIGH (ref 0.35–5.50)

## 2012-01-31 NOTE — Telephone Encounter (Signed)
[name], please, inform patient that all labs are normal except for low B12, very high cholesterol, abn thyroid Restart B12, levothyroxin  Please, mail the labs to the patient.  Lipids, TSH, B12 in 2 mo  Thx

## 2012-01-31 NOTE — Telephone Encounter (Signed)
Called pt- no answer/machine. 

## 2012-02-01 ENCOUNTER — Telehealth: Payer: Self-pay | Admitting: Internal Medicine

## 2012-02-01 NOTE — Telephone Encounter (Signed)
Please, mail the labs to the patient.     

## 2012-02-02 NOTE — Telephone Encounter (Signed)
Labs mailed

## 2012-02-14 MED ORDER — VITAMIN B-12 1000 MCG SL SUBL: 1.0000 | SUBLINGUAL_TABLET | Freq: Every day | SUBLINGUAL | Status: DC

## 2012-02-14 MED ORDER — LEVOTHYROXINE SODIUM 25 MCG PO TABS: 25.0000 ug | ORAL_TABLET | Freq: Every day | ORAL | Status: DC

## 2012-02-14 NOTE — Telephone Encounter (Signed)
Notified pt with md response & recommendations. Needing refill on levothyroxine & B12 sent to [facility],,,02/14/12@2 :20pm/LMB

## 2012-05-08 ENCOUNTER — Other Ambulatory Visit (INDEPENDENT_AMBULATORY_CARE_PROVIDER_SITE_OTHER): Payer: BC Managed Care – PPO

## 2012-05-08 ENCOUNTER — Encounter: Payer: Self-pay | Admitting: Internal Medicine

## 2012-05-08 ENCOUNTER — Ambulatory Visit (INDEPENDENT_AMBULATORY_CARE_PROVIDER_SITE_OTHER): Payer: BC Managed Care – PPO | Admitting: Internal Medicine

## 2012-05-08 VITALS — BP 150/98 | HR 80 | Temp 98.4°F | Resp 16 | Wt 181.0 lb

## 2012-05-08 DIAGNOSIS — E039 Hypothyroidism, unspecified: Secondary | ICD-10-CM

## 2012-05-08 DIAGNOSIS — G4700 Insomnia, unspecified: Secondary | ICD-10-CM | POA: Insufficient documentation

## 2012-05-08 DIAGNOSIS — E538 Deficiency of other specified B group vitamins: Secondary | ICD-10-CM

## 2012-05-08 DIAGNOSIS — D509 Iron deficiency anemia, unspecified: Secondary | ICD-10-CM

## 2012-05-08 DIAGNOSIS — E785 Hyperlipidemia, unspecified: Secondary | ICD-10-CM

## 2012-05-08 DIAGNOSIS — I10 Essential (primary) hypertension: Secondary | ICD-10-CM

## 2012-05-08 DIAGNOSIS — Z9119 Patient's noncompliance with other medical treatment and regimen: Secondary | ICD-10-CM | POA: Insufficient documentation

## 2012-05-08 LAB — CBC WITH DIFFERENTIAL/PLATELET
Basophils Absolute: 0 10*3/uL (ref 0.0–0.1)
Eosinophils Absolute: 0.1 10*3/uL (ref 0.0–0.7)
MCHC: 33.7 g/dL (ref 30.0–36.0)
MCV: 84.8 fl (ref 78.0–100.0)
Monocytes Absolute: 0.6 10*3/uL (ref 0.1–1.0)
Neutrophils Relative %: 60.3 % (ref 43.0–77.0)
Platelets: 215 10*3/uL (ref 150.0–400.0)
RDW: 13.5 % (ref 11.5–14.6)
WBC: 7 10*3/uL (ref 4.5–10.5)

## 2012-05-08 LAB — BASIC METABOLIC PANEL
BUN: 17 mg/dL (ref 6–23)
Calcium: 9.4 mg/dL (ref 8.4–10.5)
GFR: 57.42 mL/min — ABNORMAL LOW (ref >60.00)
Potassium: 4.4 mEq/L (ref 3.5–5.1)

## 2012-05-08 LAB — LDL CHOLESTEROL, DIRECT: Direct LDL: 205.4 mg/dL

## 2012-05-08 LAB — IBC PANEL: Iron: 78 ug/dL (ref 42–145)

## 2012-05-08 LAB — LIPID PANEL: HDL: 56.4 mg/dL (ref >39.00)

## 2012-05-08 LAB — VITAMIN B12: Vitamin B-12: 316 pg/mL (ref 211–911)

## 2012-05-08 LAB — TSH: TSH: 4.85 u[IU]/mL (ref 0.35–5.50)

## 2012-05-08 MED ORDER — ZOLPIDEM TARTRATE 10 MG PO TABS: 10.0000 mg | ORAL_TABLET | Freq: Every evening | ORAL | Status: DC | PRN

## 2012-05-08 NOTE — Assessment & Plan Note (Signed)
She stopped Rx

## 2012-05-08 NOTE — Assessment & Plan Note (Signed)
She was treated in the past

## 2012-05-08 NOTE — Assessment & Plan Note (Signed)
Continue with current prescription therapy as reflected on the Med list.  

## 2012-05-08 NOTE — Assessment & Plan Note (Signed)
Discussed.

## 2012-05-08 NOTE — Assessment & Plan Note (Signed)
Continue with current prescription therapy as reflected on the Med list. She has to take a full dose - not a 1/2 dose

## 2012-05-08 NOTE — Progress Notes (Signed)
   Subjective:    Patient ID: Kayla Aguirre, female    DOB: 1951/04/15, 61 y.o.   MRN: [id number]  Hypertension Associated symptoms include headaches. Pertinent negatives include no chest pain, palpitations or shortness of breath.  Headache  Pertinent negatives include no coughing, dizziness or rhinorrhea. Her past medical history is significant for hypertension.     C/o elev BP at times 180/100. She started to take Losartan 1/2-1   BP Readings from Last 3 Encounters:  05/08/12 150/98  01/25/12 160/108  09/08/11 158/100   Wt Readings from Last 3 Encounters:  05/08/12 181 lb (82.101 kg)  01/25/12 180 lb (81.647 kg)  09/08/11 177 lb (80.287 kg)     Review of Systems  Constitutional: Negative for diaphoresis and fatigue.  HENT: Negative for congestion and rhinorrhea.   Respiratory: Negative for cough and shortness of breath.   Cardiovascular: Negative for chest pain, palpitations and leg swelling.  Genitourinary: Negative for dysuria.  Musculoskeletal: Negative for gait problem.  Skin: Negative for rash.  Neurological: Positive for headaches. Negative for dizziness and light-headedness.  Psychiatric/Behavioral: Negative for disturbed wake/sleep cycle. The patient is not nervous/anxious.        Objective:   Physical Exam  Constitutional: She appears well-developed. No distress.  HENT:  Head: Normocephalic.  Right Ear: External ear normal.  Left Ear: External ear normal.  Nose: Nose normal.  Mouth/Throat: Oropharynx is clear and moist.  Eyes: Conjunctivae are normal. Pupils are equal, round, and reactive to light. Right eye exhibits no discharge. Left eye exhibits no discharge.  Neck: Normal range of motion. Neck supple. No JVD present. No tracheal deviation present. No thyromegaly present.  Cardiovascular: Normal rate, regular rhythm and normal heart sounds.   Pulmonary/Chest: No stridor. No respiratory distress. She has no wheezes.  Abdominal: Soft. Bowel sounds are  normal. She exhibits no distension and no mass. There is no tenderness. There is no rebound and no guarding.  Musculoskeletal: She exhibits no edema and no tenderness.  Lymphadenopathy:    She has no cervical adenopathy.  Neurological: She displays normal reflexes. No cranial nerve deficit. She exhibits normal muscle tone. Coordination normal.  Skin: No rash noted. No erythema.  Psychiatric: She has a normal mood and affect. Her behavior is normal. Judgment and thought content normal.    Lab Results  Component Value Date   WBC 6.8 01/31/2012   HGB 15.0 01/31/2012   HCT 44.6 01/31/2012   PLT 227.0 01/31/2012   GLUCOSE 113* 01/31/2012   CHOL 300* 01/31/2012   TRIG 262.0* 01/31/2012   HDL 57.50 01/31/2012   LDLDIRECT 210.4 01/31/2012   ALT 44* 01/31/2012   AST 35 01/31/2012   NA 140 01/31/2012   K 4.3 01/31/2012   CL 106 01/31/2012   CREATININE 0.9 01/31/2012   BUN 12 01/31/2012   CO2 26 01/31/2012   TSH 5.67* 01/31/2012         Assessment & Plan:

## 2012-05-08 NOTE — Assessment & Plan Note (Signed)
  On diet  

## 2012-08-09 ENCOUNTER — Ambulatory Visit (INDEPENDENT_AMBULATORY_CARE_PROVIDER_SITE_OTHER): Payer: BC Managed Care – PPO | Admitting: Internal Medicine

## 2012-08-09 ENCOUNTER — Other Ambulatory Visit (INDEPENDENT_AMBULATORY_CARE_PROVIDER_SITE_OTHER): Payer: BC Managed Care – PPO

## 2012-08-09 ENCOUNTER — Encounter: Payer: Self-pay | Admitting: Internal Medicine

## 2012-08-09 VITALS — BP 150/96 | HR 80 | Temp 97.1°F | Resp 16 | Wt 182.0 lb

## 2012-08-09 DIAGNOSIS — G4700 Insomnia, unspecified: Secondary | ICD-10-CM

## 2012-08-09 DIAGNOSIS — E538 Deficiency of other specified B group vitamins: Secondary | ICD-10-CM

## 2012-08-09 DIAGNOSIS — I10 Essential (primary) hypertension: Secondary | ICD-10-CM

## 2012-08-09 DIAGNOSIS — Z9119 Patient's noncompliance with other medical treatment and regimen: Secondary | ICD-10-CM

## 2012-08-09 LAB — CBC WITH DIFFERENTIAL/PLATELET
Basophils Absolute: 0 10*3/uL (ref 0.0–0.1)
Eosinophils Relative: 0.7 % (ref 0.0–5.0)
HCT: 46.5 % — ABNORMAL HIGH (ref 36.0–46.0)
Hemoglobin: 15.6 g/dL — ABNORMAL HIGH (ref 12.0–15.0)
Lymphocytes Relative: 27.6 % (ref 12.0–46.0)
Lymphs Abs: 2.2 10*3/uL (ref 0.7–4.0)
Monocytes Relative: 6.5 % (ref 3.0–12.0)
Neutro Abs: 5.1 10*3/uL (ref 1.4–7.7)
Platelets: 236 10*3/uL (ref 150.0–400.0)
WBC: 7.9 10*3/uL (ref 4.5–10.5)

## 2012-08-09 LAB — BASIC METABOLIC PANEL
Calcium: 9.3 mg/dL (ref 8.4–10.5)
GFR: 68.66 mL/min (ref >60.00)
Potassium: 4.4 mEq/L (ref 3.5–5.1)
Sodium: 139 mEq/L (ref 135–145)

## 2012-08-09 LAB — VITAMIN B12: Vitamin B-12: 315 pg/mL (ref 211–911)

## 2012-08-09 MED ORDER — DICLOFENAC SODIUM 1 % TD GEL: 2.0000 g | Freq: Four times a day (QID) | TRANSDERMAL | Status: DC

## 2012-08-09 MED ORDER — AMLODIPINE BESYLATE-VALSARTAN 5-320 MG PO TABS: 1.0000 | ORAL_TABLET | Freq: Every day | ORAL | Status: DC

## 2012-08-09 MED ORDER — ZOLPIDEM TARTRATE 10 MG PO TABS: 10.0000 mg | ORAL_TABLET | Freq: Every evening | ORAL | Status: DC | PRN

## 2012-08-09 NOTE — Assessment & Plan Note (Signed)
Chronic issue - discussed again

## 2012-08-09 NOTE — Assessment & Plan Note (Signed)
Vit D 

## 2012-08-09 NOTE — Progress Notes (Signed)
   Subjective:    Patient ID: Kayla Aguirre, female    DOB: [birth year], 61 y.o.   MRN: [id number]  Hypertension - better Associated symptoms include headaches. Pertinent negatives include no chest pain, palpitations or shortness of breath.  Headache - resolved Pertinent negatives include no coughing, dizziness or rhinorrhea. Her past medical history is significant for hypertension.  C/o R thumb pain   C/o elev BP at times 180/100. She started to take Losartan 1/2-1   BP Readings from Last 3 Encounters:  08/09/12 150/96  05/08/12 150/98  01/25/12 160/108   Wt Readings from Last 3 Encounters:  08/09/12 182 lb (82.555 kg)  05/08/12 181 lb (82.101 kg)  01/25/12 180 lb (81.647 kg)       Review of Systems  Constitutional: Negative for diaphoresis and fatigue.  HENT: Negative for congestion and rhinorrhea.   Respiratory: Negative for cough and shortness of breath.   Cardiovascular: Negative for chest pain, palpitations and leg swelling.  Genitourinary: Negative for dysuria.  Musculoskeletal: Negative for gait problem.  Skin: Negative for rash.  Neurological: Positive for headaches. Negative for dizziness and light-headedness.  Psychiatric/Behavioral: Negative for disturbed wake/sleep cycle. The patient is not nervous/anxious.        Objective:   Physical Exam  Constitutional: She appears well-developed. No distress.  HENT:  Head: Normocephalic.  Right Ear: External ear normal.  Left Ear: External ear normal.  Nose: Nose normal.  Mouth/Throat: Oropharynx is clear and moist.  Eyes: Conjunctivae are normal. Pupils are equal, round, and reactive to light. Right eye exhibits no discharge. Left eye exhibits no discharge.  Neck: Normal range of motion. Neck supple. No JVD present. No tracheal deviation present. No thyromegaly present.  Cardiovascular: Normal rate, regular rhythm and normal heart sounds.   Pulmonary/Chest: No stridor. No respiratory distress. She has no wheezes.   Abdominal: Soft. Bowel sounds are normal. She exhibits no distension and no mass. There is no tenderness. There is no rebound and no guarding.  Musculoskeletal: She exhibits no edema and no tenderness.  Lymphadenopathy:    She has no cervical adenopathy.  Neurological: She displays normal reflexes. No cranial nerve deficit. She exhibits normal muscle tone. Coordination normal.  Skin: No rash noted. No erythema.  Psychiatric: She has a normal mood and affect. Her behavior is normal. Judgment and thought content normal.    Lab Results  Component Value Date   WBC 6.8 01/31/2012   HGB 15.0 01/31/2012   HCT 44.6 01/31/2012   PLT 227.0 01/31/2012   GLUCOSE 113* 01/31/2012   CHOL 300* 01/31/2012   TRIG 262.0* 01/31/2012   HDL 57.50 01/31/2012   LDLDIRECT 210.4 01/31/2012   ALT 44* 01/31/2012   AST 35 01/31/2012   NA 140 01/31/2012   K 4.3 01/31/2012   CL 106 01/31/2012   CREATININE 0.9 01/31/2012   BUN 12 01/31/2012   CO2 26 01/31/2012   TSH 5.67* 01/31/2012         Assessment & Plan:

## 2012-08-09 NOTE — Assessment & Plan Note (Signed)
Exforge if covered

## 2012-08-09 NOTE — Assessment & Plan Note (Signed)
Restart

## 2012-08-09 NOTE — Assessment & Plan Note (Signed)
Re- start Vit B12 °

## 2012-08-10 LAB — VITAMIN D 25 HYDROXY (VIT D DEFICIENCY, FRACTURES): Vit D, 25-Hydroxy: 27 ng/mL — ABNORMAL LOW (ref 30–89)

## 2012-08-27 ENCOUNTER — Other Ambulatory Visit: Payer: Self-pay | Admitting: *Deleted

## 2012-08-27 MED ORDER — DICLOFENAC SODIUM 1 % TD GEL: 2.0000 g | Freq: Four times a day (QID) | TRANSDERMAL | Status: DC

## 2012-11-13 ENCOUNTER — Encounter: Payer: Self-pay | Admitting: Internal Medicine

## 2012-11-13 ENCOUNTER — Other Ambulatory Visit (INDEPENDENT_AMBULATORY_CARE_PROVIDER_SITE_OTHER): Payer: BC Managed Care – PPO

## 2012-11-13 ENCOUNTER — Ambulatory Visit (INDEPENDENT_AMBULATORY_CARE_PROVIDER_SITE_OTHER): Payer: BC Managed Care – PPO | Admitting: Internal Medicine

## 2012-11-13 VITALS — BP 150/100 | HR 80 | Temp 97.5°F | Resp 16 | Wt 184.0 lb

## 2012-11-13 DIAGNOSIS — E538 Deficiency of other specified B group vitamins: Secondary | ICD-10-CM

## 2012-11-13 DIAGNOSIS — I10 Essential (primary) hypertension: Secondary | ICD-10-CM

## 2012-11-13 DIAGNOSIS — Z9119 Patient's noncompliance with other medical treatment and regimen: Secondary | ICD-10-CM

## 2012-11-13 DIAGNOSIS — E785 Hyperlipidemia, unspecified: Secondary | ICD-10-CM

## 2012-11-13 DIAGNOSIS — E559 Vitamin D deficiency, unspecified: Secondary | ICD-10-CM

## 2012-11-13 LAB — URINALYSIS, ROUTINE W REFLEX MICROSCOPIC
Bilirubin Urine: NEGATIVE
Hgb urine dipstick: NEGATIVE
Ketones, ur: NEGATIVE
Total Protein, Urine: NEGATIVE
Urine Glucose: NEGATIVE

## 2012-11-13 LAB — BASIC METABOLIC PANEL
BUN: 15 mg/dL (ref 6–23)
CO2: 28 mEq/L (ref 19–32)
Calcium: 9.1 mg/dL (ref 8.4–10.5)
Chloride: 104 mEq/L (ref 96–112)
Creatinine, Ser: 0.8 mg/dL (ref 0.4–1.2)

## 2012-11-13 LAB — CORTISOL: Cortisol, Plasma: 14.8 ug/dL

## 2012-11-13 MED ORDER — AMLODIPINE BESYLATE-VALSARTAN 5-320 MG PO TABS: 1.0000 | ORAL_TABLET | Freq: Every day | ORAL | Status: DC

## 2012-11-13 NOTE — Assessment & Plan Note (Signed)
Continue with current prescription therapy as reflected on the Med list.  

## 2012-11-13 NOTE — Assessment & Plan Note (Signed)
Declined statins. 

## 2012-11-13 NOTE — Progress Notes (Signed)
   Subjective:     Hypertension - better  Associated symptoms include headaches. Pertinent negatives include no chest pain, palpitations or shortness of breath. He states BP is low at times - sometimes she doesn't take her meds   Headache resolved.  Pertinent negatives include no coughing, dizziness or rhinorrhea. Her past medical history is significant for hypertension.     BP Readings from Last 3 Encounters:  11/13/12 150/100  08/09/12 150/96  05/08/12 150/98   Wt Readings from Last 3 Encounters:  11/13/12 184 lb (83.462 kg)  08/09/12 182 lb (82.555 kg)  05/08/12 181 lb (82.101 kg)       Review of Systems  Constitutional: Negative for diaphoresis and fatigue.  HENT: Negative for congestion and rhinorrhea.   Respiratory: Negative for cough and shortness of breath.   Cardiovascular: Negative for chest pain, palpitations and leg swelling.  Genitourinary: Negative for dysuria.  Musculoskeletal: Negative for gait problem.  Skin: Negative for rash.  Neurological: Positive for headaches. Negative for dizziness and light-headedness.  Psychiatric/Behavioral: Negative for disturbed wake/sleep cycle. The patient is not nervous/anxious.        Objective:   Physical Exam  Constitutional: She appears well-developed. No distress.  HENT:  Head: Normocephalic.  Right Ear: External ear normal.  Left Ear: External ear normal.  Nose: Nose normal.  Mouth/Throat: Oropharynx is clear and moist.  Eyes: Conjunctivae are normal. Pupils are equal, round, and reactive to light. Right eye exhibits no discharge. Left eye exhibits no discharge.  Neck: Normal range of motion. Neck supple. No JVD present. No tracheal deviation present. No thyromegaly present.  Cardiovascular: Normal rate, regular rhythm and normal heart sounds.   Pulmonary/Chest: No stridor. No respiratory distress. She has no wheezes.  Abdominal: Soft. Bowel sounds are normal. She exhibits no distension and no mass. There is  no tenderness. There is no rebound and no guarding.  Musculoskeletal: She exhibits no edema and no tenderness.  Lymphadenopathy:    She has no cervical adenopathy.  Neurological: She displays normal reflexes. No cranial nerve deficit. She exhibits normal muscle tone. Coordination normal.  Skin: No rash noted. No erythema.  Psychiatric: She has a normal mood and affect. Her behavior is normal. Judgment and thought content normal.    Lab Results  Component Value Date   WBC 6.8 01/31/2012   HGB 15.0 01/31/2012   HCT 44.6 01/31/2012   PLT 227.0 01/31/2012   GLUCOSE 113* 01/31/2012   CHOL 300* 01/31/2012   TRIG 262.0* 01/31/2012   HDL 57.50 01/31/2012   LDLDIRECT 210.4 01/31/2012   ALT 44* 01/31/2012   AST 35 01/31/2012   NA 140 01/31/2012   K 4.3 01/31/2012   CL 106 01/31/2012   CREATININE 0.9 01/31/2012   BUN 12 01/31/2012   CO2 26 01/31/2012   TSH 5.67* 01/31/2012         Assessment & Plan:

## 2012-11-13 NOTE — Assessment & Plan Note (Signed)
Discussed.

## 2012-11-13 NOTE — Assessment & Plan Note (Signed)
Cont Rx - take consistently

## 2012-11-18 LAB — ALDOSTERONE + RENIN ACTIVITY W/ RATIO
ALDO / PRA Ratio: 19.2 Ratio (ref 0.9–28.9)
PRA LC/MS/MS: 0.26 ng/mL/h (ref 0.25–5.82)

## 2013-02-13 ENCOUNTER — Encounter: Payer: Self-pay | Admitting: Internal Medicine

## 2013-02-13 ENCOUNTER — Ambulatory Visit (INDEPENDENT_AMBULATORY_CARE_PROVIDER_SITE_OTHER): Payer: BC Managed Care – PPO | Admitting: Internal Medicine

## 2013-02-13 VITALS — BP 130/85 | HR 72 | Temp 97.9°F | Resp 16 | Wt 185.0 lb

## 2013-02-13 DIAGNOSIS — Z9119 Patient's noncompliance with other medical treatment and regimen: Secondary | ICD-10-CM

## 2013-02-13 DIAGNOSIS — E039 Hypothyroidism, unspecified: Secondary | ICD-10-CM

## 2013-02-13 DIAGNOSIS — I10 Essential (primary) hypertension: Secondary | ICD-10-CM

## 2013-02-13 DIAGNOSIS — E538 Deficiency of other specified B group vitamins: Secondary | ICD-10-CM

## 2013-02-13 DIAGNOSIS — D509 Iron deficiency anemia, unspecified: Secondary | ICD-10-CM

## 2013-02-13 MED ORDER — AMLODIPINE BESYLATE-VALSARTAN 5-320 MG PO TABS: 1.0000 | ORAL_TABLET | Freq: Every day | ORAL | Status: DC

## 2013-02-13 NOTE — Assessment & Plan Note (Signed)
Continue with current prescription therapy as reflected on the Med list.  

## 2013-02-13 NOTE — Progress Notes (Signed)
   Subjective:     F/u Hypertension   Associated symptoms include headaches. Pertinent negatives include no chest pain, palpitations or shortness of breath. He states BP is low at times - sometimes she doesn't take her meds - every other   F/u Headache resolved.  Pertinent negatives include no coughing, dizziness or rhinorrhea. Her past medical history is significant for hypertension.     BP Readings from Last 3 Encounters:  02/13/13 168/110  11/13/12 150/100  08/09/12 150/96   Wt Readings from Last 3 Encounters:  02/13/13 185 lb (83.915 kg)  11/13/12 184 lb (83.462 kg)  08/09/12 182 lb (82.555 kg)       Review of Systems  Constitutional: Negative for diaphoresis and fatigue.  HENT: Negative for congestion and rhinorrhea.   Respiratory: Negative for cough and shortness of breath.   Cardiovascular: Negative for chest pain, palpitations and leg swelling.  Genitourinary: Negative for dysuria.  Musculoskeletal: Negative for gait problem.  Skin: Negative for rash.  Neurological: Positive for headaches. Negative for dizziness and light-headedness.  Psychiatric/Behavioral: Negative for disturbed wake/sleep cycle. The patient is not nervous/anxious.        Objective:   Physical Exam  Constitutional: She appears well-developed. No distress.  HENT:  Head: Normocephalic.  Right Ear: External ear normal.  Left Ear: External ear normal.  Nose: Nose normal.  Mouth/Throat: Oropharynx is clear and moist.  Eyes: Conjunctivae are normal. Pupils are equal, round, and reactive to light. Right eye exhibits no discharge. Left eye exhibits no discharge.  Neck: Normal range of motion. Neck supple. No JVD present. No tracheal deviation present. No thyromegaly present.  Cardiovascular: Normal rate, regular rhythm and normal heart sounds.   Pulmonary/Chest: No stridor. No respiratory distress. She has no wheezes.  Abdominal: Soft. Bowel sounds are normal. She exhibits no distension and no  mass. There is no tenderness. There is no rebound and no guarding.  Musculoskeletal: She exhibits no edema and no tenderness.  Lymphadenopathy:    She has no cervical adenopathy.  Neurological: She displays normal reflexes. No cranial nerve deficit. She exhibits normal muscle tone. Coordination normal.  Skin: No rash noted. No erythema.  Psychiatric: She has a normal mood and affect. Her behavior is normal. Judgment and thought content normal.    Lab Results  Component Value Date   WBC 6.8 01/31/2012   HGB 15.0 01/31/2012   HCT 44.6 01/31/2012   PLT 227.0 01/31/2012   GLUCOSE 113* 01/31/2012   CHOL 300* 01/31/2012   TRIG 262.0* 01/31/2012   HDL 57.50 01/31/2012   LDLDIRECT 210.4 01/31/2012   ALT 44* 01/31/2012   AST 35 01/31/2012   NA 140 01/31/2012   K 4.3 01/31/2012   CL 106 01/31/2012   CREATININE 0.9 01/31/2012   BUN 12 01/31/2012   CO2 26 01/31/2012   TSH 5.67* 01/31/2012         Assessment & Plan:

## 2013-02-13 NOTE — Assessment & Plan Note (Signed)
Risks associated with treatment noncompliance were discussed. Compliance was encouraged. 

## 2013-02-13 NOTE — Assessment & Plan Note (Signed)
Labs

## 2013-05-16 ENCOUNTER — Other Ambulatory Visit (INDEPENDENT_AMBULATORY_CARE_PROVIDER_SITE_OTHER): Payer: BC Managed Care – PPO

## 2013-05-16 ENCOUNTER — Ambulatory Visit (INDEPENDENT_AMBULATORY_CARE_PROVIDER_SITE_OTHER): Payer: BC Managed Care – PPO | Admitting: Internal Medicine

## 2013-05-16 ENCOUNTER — Encounter: Payer: Self-pay | Admitting: Internal Medicine

## 2013-05-16 VITALS — BP 148/90 | HR 76 | Temp 98.1°F | Resp 16 | Wt 184.0 lb

## 2013-05-16 DIAGNOSIS — E559 Vitamin D deficiency, unspecified: Secondary | ICD-10-CM

## 2013-05-16 DIAGNOSIS — E538 Deficiency of other specified B group vitamins: Secondary | ICD-10-CM

## 2013-05-16 DIAGNOSIS — H5789 Other specified disorders of eye and adnexa: Secondary | ICD-10-CM | POA: Insufficient documentation

## 2013-05-16 DIAGNOSIS — I10 Essential (primary) hypertension: Secondary | ICD-10-CM

## 2013-05-16 DIAGNOSIS — E039 Hypothyroidism, unspecified: Secondary | ICD-10-CM

## 2013-05-16 LAB — VITAMIN B12: Vitamin B-12: 310 pg/mL (ref 211–911)

## 2013-05-16 LAB — BASIC METABOLIC PANEL
Calcium: 9.2 mg/dL (ref 8.4–10.5)
Creatinine, Ser: 1.1 mg/dL (ref 0.4–1.2)
GFR: 54.2 mL/min — ABNORMAL LOW (ref >60.00)
Sodium: 137 mEq/L (ref 135–145)

## 2013-05-16 LAB — TSH: TSH: 2.32 u[IU]/mL (ref 0.35–5.50)

## 2013-05-16 NOTE — Progress Notes (Signed)
   Subjective:     F/u Hypertension   Associated symptoms include headaches. Pertinent negatives include no chest pain, palpitations or shortness of breath. He states BP is low at times - sometimes she doesn't take her meds - every other Kayla Aguirre is taking BP meds irregular and on her own schedule   F/u Headache resolved.  Pertinent negatives include no coughing, dizziness or rhinorrhea. Her past medical history is significant for hypertension.   F/u B12 and Vit D deficiency   C/o eye rednes    BP Readings from Last 3 Encounters:  05/16/13 148/90  02/13/13 130/85  11/13/12 150/100   Wt Readings from Last 3 Encounters:  05/16/13 184 lb (83.462 kg)  02/13/13 185 lb (83.915 kg)  11/13/12 184 lb (83.462 kg)       Review of Systems  Constitutional: Negative for diaphoresis and fatigue.  HENT: Negative for congestion and rhinorrhea.   Respiratory: Negative for cough and shortness of breath.   Cardiovascular: Negative for chest pain, palpitations and leg swelling.  Genitourinary: Negative for dysuria.  Musculoskeletal: Negative for gait problem.  Skin: Negative for rash.  Neurological: Positive for headaches. Negative for dizziness and light-headedness.  Psychiatric/Behavioral: Negative for disturbed wake/sleep cycle. The patient is not nervous/anxious.        Objective:   Physical Exam  Constitutional: She appears well-developed. No distress.  HENT:  Head: Normocephalic.  Right Ear: External ear normal.  Left Ear: External ear normal.  Nose: Nose normal.  Mouth/Throat: Oropharynx is clear and moist.  Eyes: Conjunctivae are normal. Pupils are equal, round, and reactive to light. Right eye exhibits no discharge. Left eye exhibits no discharge.  Neck: Normal range of motion. Neck supple. No JVD present. No tracheal deviation present. No thyromegaly present.  Cardiovascular: Normal rate, regular rhythm and normal heart sounds.   Pulmonary/Chest: No stridor. No  respiratory distress. She has no wheezes.  Abdominal: Soft. Bowel sounds are normal. She exhibits no distension and no mass. There is no tenderness. There is no rebound and no guarding.  Musculoskeletal: She exhibits no edema and no tenderness.  Lymphadenopathy:    She has no cervical adenopathy.  Neurological: She displays normal reflexes. No cranial nerve deficit. She exhibits normal muscle tone. Coordination normal.  Skin: No rash noted. No erythema.  Psychiatric: She has a normal mood and affect. Her behavior is normal. Judgment and thought content normal.    Lab Results  Component Value Date   WBC 6.8 01/31/2012   HGB 15.0 01/31/2012   HCT 44.6 01/31/2012   PLT 227.0 01/31/2012   GLUCOSE 113* 01/31/2012   CHOL 300* 01/31/2012   TRIG 262.0* 01/31/2012   HDL 57.50 01/31/2012   LDLDIRECT 210.4 01/31/2012   ALT 44* 01/31/2012   AST 35 01/31/2012   NA 140 01/31/2012   K 4.3 01/31/2012   CL 106 01/31/2012   CREATININE 0.9 01/31/2012   BUN 12 01/31/2012   CO2 26 01/31/2012   TSH 5.67* 01/31/2012         Assessment & Plan:

## 2013-05-16 NOTE — Assessment & Plan Note (Signed)
Ophth consult Improve compliance w/BP meds

## 2013-05-16 NOTE — Assessment & Plan Note (Signed)
Continue with current prescription therapy as reflected on the Med list.  

## 2013-05-16 NOTE — Assessment & Plan Note (Addendum)
Continue/re-start current prescription therapy as reflected on the Med list.

## 2013-05-16 NOTE — Assessment & Plan Note (Signed)
Asked to use BP med regular at the same time. Risks associated with treatment noncompliance were discussed. Compliance was encouraged.

## 2013-05-16 NOTE — Assessment & Plan Note (Signed)
Not taking rx

## 2013-05-17 ENCOUNTER — Other Ambulatory Visit: Payer: BC Managed Care – PPO

## 2013-05-17 DIAGNOSIS — E559 Vitamin D deficiency, unspecified: Secondary | ICD-10-CM

## 2013-05-17 DIAGNOSIS — I10 Essential (primary) hypertension: Secondary | ICD-10-CM

## 2013-05-17 DIAGNOSIS — E538 Deficiency of other specified B group vitamins: Secondary | ICD-10-CM

## 2013-05-17 DIAGNOSIS — E039 Hypothyroidism, unspecified: Secondary | ICD-10-CM

## 2013-05-18 LAB — VITAMIN D 25 HYDROXY (VIT D DEFICIENCY, FRACTURES): Vit D, 25-Hydroxy: 21 ng/mL — ABNORMAL LOW (ref 30–89)

## 2013-08-16 ENCOUNTER — Ambulatory Visit: Payer: BC Managed Care – PPO | Admitting: Internal Medicine

## 2013-08-21 ENCOUNTER — Encounter: Payer: Self-pay | Admitting: Internal Medicine

## 2013-08-21 ENCOUNTER — Ambulatory Visit (INDEPENDENT_AMBULATORY_CARE_PROVIDER_SITE_OTHER): Payer: BC Managed Care – PPO | Admitting: Internal Medicine

## 2013-08-21 VITALS — BP 154/90 | HR 76 | Temp 98.5°F | Resp 16 | Wt 185.0 lb

## 2013-08-21 DIAGNOSIS — E039 Hypothyroidism, unspecified: Secondary | ICD-10-CM

## 2013-08-21 DIAGNOSIS — I10 Essential (primary) hypertension: Secondary | ICD-10-CM

## 2013-08-21 DIAGNOSIS — E538 Deficiency of other specified B group vitamins: Secondary | ICD-10-CM

## 2013-08-21 NOTE — Assessment & Plan Note (Signed)
Continue with current prescription therapy as reflected on the Med list.  

## 2013-08-21 NOTE — Assessment & Plan Note (Addendum)
Labs Better. Pt was encouraged to use BP meds daily as directed

## 2013-08-21 NOTE — Progress Notes (Signed)
   Subjective:     F/u Hypertension   Associated symptoms include headaches. Pertinent negatives include no chest pain, palpitations or shortness of breath. He states BP is low at times - sometimes she doesn't take her meds - every other Madie is taking BP meds irregular and on her own schedule   F/u Headache resolved.  Pertinent negatives include no coughing, dizziness or rhinorrhea. Her past medical history is significant for hypertension.   F/u B12 and Vit D deficiency   F/u eye redness - resolved    BP Readings from Last 3 Encounters:  08/21/13 154/90  05/16/13 148/90  02/13/13 130/85   Wt Readings from Last 3 Encounters:  08/21/13 185 lb (83.915 kg)  05/16/13 184 lb (83.462 kg)  02/13/13 185 lb (83.915 kg)       Review of Systems  Constitutional: Negative for diaphoresis and fatigue.  HENT: Negative for congestion and rhinorrhea.   Respiratory: Negative for cough and shortness of breath.   Cardiovascular: Negative for chest pain, palpitations and leg swelling.  Genitourinary: Negative for dysuria.  Musculoskeletal: Negative for gait problem.  Skin: Negative for rash.  Neurological: Positive for headaches. Negative for dizziness and light-headedness.  Psychiatric/Behavioral: Negative for disturbed wake/sleep cycle. The patient is not nervous/anxious.        Objective:   Physical Exam  Constitutional: She appears well-developed. No distress.  HENT:  Head: Normocephalic.  Right Ear: External ear normal.  Left Ear: External ear normal.  Nose: Nose normal.  Mouth/Throat: Oropharynx is clear and moist.  Eyes: Conjunctivae are normal. Pupils are equal, round, and reactive to light. Right eye exhibits no discharge. Left eye exhibits no discharge.  Neck: Normal range of motion. Neck supple. No JVD present. No tracheal deviation present. No thyromegaly present.  Cardiovascular: Normal rate, regular rhythm and normal heart sounds.   Pulmonary/Chest: No stridor.  No respiratory distress. She has no wheezes.  Abdominal: Soft. Bowel sounds are normal. She exhibits no distension and no mass. There is no tenderness. There is no rebound and no guarding.  Musculoskeletal: She exhibits no edema and no tenderness.  Lymphadenopathy:    She has no cervical adenopathy.  Neurological: She displays normal reflexes. No cranial nerve deficit. She exhibits normal muscle tone. Coordination normal.  Skin: No rash noted. No erythema.  Psychiatric: She has a normal mood and affect. Her behavior is normal. Judgment and thought content normal.    Lab Results  Component Value Date   WBC 6.8 01/31/2012   HGB 15.0 01/31/2012   HCT 44.6 01/31/2012   PLT 227.0 01/31/2012   GLUCOSE 113* 01/31/2012   CHOL 300* 01/31/2012   TRIG 262.0* 01/31/2012   HDL 57.50 01/31/2012   LDLDIRECT 210.4 01/31/2012   ALT 44* 01/31/2012   AST 35 01/31/2012   NA 140 01/31/2012   K 4.3 01/31/2012   CL 106 01/31/2012   CREATININE 0.9 01/31/2012   BUN 12 01/31/2012   CO2 26 01/31/2012   TSH 5.67* 01/31/2012         Assessment & Plan:

## 2013-08-21 NOTE — Progress Notes (Signed)
Pre visit review using our clinic review tool, if applicable. No additional management support is needed unless otherwise documented below in the visit note. 

## 2014-02-19 ENCOUNTER — Encounter: Payer: BC Managed Care – PPO | Admitting: Internal Medicine

## 2014-03-25 ENCOUNTER — Other Ambulatory Visit (INDEPENDENT_AMBULATORY_CARE_PROVIDER_SITE_OTHER): Payer: BC Managed Care – PPO

## 2014-03-25 ENCOUNTER — Ambulatory Visit (INDEPENDENT_AMBULATORY_CARE_PROVIDER_SITE_OTHER): Payer: BC Managed Care – PPO | Admitting: Internal Medicine

## 2014-03-25 ENCOUNTER — Encounter: Payer: Self-pay | Admitting: Internal Medicine

## 2014-03-25 VITALS — BP 180/110 | HR 80 | Temp 98.2°F | Resp 16 | Ht 65.0 in | Wt 188.0 lb

## 2014-03-25 DIAGNOSIS — E039 Hypothyroidism, unspecified: Secondary | ICD-10-CM

## 2014-03-25 DIAGNOSIS — Z0000 Encounter for general adult medical examination without abnormal findings: Secondary | ICD-10-CM

## 2014-03-25 DIAGNOSIS — D751 Secondary polycythemia: Secondary | ICD-10-CM

## 2014-03-25 DIAGNOSIS — R945 Abnormal results of liver function studies: Secondary | ICD-10-CM

## 2014-03-25 DIAGNOSIS — Z23 Encounter for immunization: Secondary | ICD-10-CM

## 2014-03-25 DIAGNOSIS — R7309 Other abnormal glucose: Secondary | ICD-10-CM

## 2014-03-25 DIAGNOSIS — E559 Vitamin D deficiency, unspecified: Secondary | ICD-10-CM

## 2014-03-25 DIAGNOSIS — E538 Deficiency of other specified B group vitamins: Secondary | ICD-10-CM

## 2014-03-25 DIAGNOSIS — D509 Iron deficiency anemia, unspecified: Secondary | ICD-10-CM

## 2014-03-25 DIAGNOSIS — R7989 Other specified abnormal findings of blood chemistry: Secondary | ICD-10-CM

## 2014-03-25 DIAGNOSIS — D45 Polycythemia vera: Secondary | ICD-10-CM

## 2014-03-25 DIAGNOSIS — I10 Essential (primary) hypertension: Secondary | ICD-10-CM

## 2014-03-25 DIAGNOSIS — E785 Hyperlipidemia, unspecified: Secondary | ICD-10-CM

## 2014-03-25 LAB — CBC WITH DIFFERENTIAL/PLATELET
BASOS ABS: 0 10*3/uL (ref 0.0–0.1)
Basophils Relative: 0.5 % (ref 0.0–3.0)
Eosinophils Absolute: 0.1 10*3/uL (ref 0.0–0.7)
Eosinophils Relative: 0.8 % (ref 0.0–5.0)
HEMATOCRIT: 45.8 % (ref 36.0–46.0)
Hemoglobin: 15.7 g/dL — ABNORMAL HIGH (ref 12.0–15.0)
LYMPHS ABS: 2.2 10*3/uL (ref 0.7–4.0)
Lymphocytes Relative: 30.5 % (ref 12.0–46.0)
MCHC: 34.3 g/dL (ref 30.0–36.0)
MCV: 84.3 fl (ref 78.0–100.0)
MONO ABS: 0.6 10*3/uL (ref 0.1–1.0)
Monocytes Relative: 7.7 % (ref 3.0–12.0)
Neutro Abs: 4.3 10*3/uL (ref 1.4–7.7)
Neutrophils Relative %: 60.5 % (ref 43.0–77.0)
PLATELETS: 226 10*3/uL (ref 150.0–400.0)
RBC: 5.43 Mil/uL — ABNORMAL HIGH (ref 3.87–5.11)
RDW: 12.8 % (ref 11.5–15.5)
WBC: 7.2 10*3/uL (ref 4.0–10.5)

## 2014-03-25 LAB — BASIC METABOLIC PANEL
BUN: 13 mg/dL (ref 6–23)
CO2: 26 mEq/L (ref 19–32)
Calcium: 9.4 mg/dL (ref 8.4–10.5)
Chloride: 105 mEq/L (ref 96–112)
Creatinine, Ser: 0.9 mg/dL (ref 0.4–1.2)
GFR: 64.12 mL/min (ref >60.00)
Glucose, Bld: 135 mg/dL — ABNORMAL HIGH (ref 70–99)
Potassium: 4.2 mEq/L (ref 3.5–5.1)
Sodium: 139 mEq/L (ref 135–145)

## 2014-03-25 LAB — URINALYSIS
Hgb urine dipstick: NEGATIVE
Ketones, ur: NEGATIVE
LEUKOCYTES UA: NEGATIVE
NITRITE: NEGATIVE
Total Protein, Urine: NEGATIVE
Urine Glucose: NEGATIVE
Urobilinogen, UA: 0.2 (ref 0.0–1.0)
pH: 5.5 (ref 5.0–8.0)

## 2014-03-25 LAB — HEPATIC FUNCTION PANEL
ALBUMIN: 4 g/dL (ref 3.5–5.2)
ALT: 76 U/L — ABNORMAL HIGH (ref 0–35)
AST: 49 U/L — ABNORMAL HIGH (ref 0–37)
Alkaline Phosphatase: 87 U/L (ref 39–117)
Bilirubin, Direct: 0.1 mg/dL (ref 0.0–0.3)
Total Bilirubin: 1.1 mg/dL (ref 0.2–1.2)
Total Protein: 7.6 g/dL (ref 6.0–8.3)

## 2014-03-25 LAB — VITAMIN D 25 HYDROXY (VIT D DEFICIENCY, FRACTURES): VITD: 20.27 ng/mL

## 2014-03-25 LAB — VITAMIN B12: VITAMIN B 12: 325 pg/mL (ref 211–911)

## 2014-03-25 LAB — LIPID PANEL
CHOLESTEROL: 319 mg/dL — AB (ref 0–200)
HDL: 48.4 mg/dL (ref >39.00)
LDL Cholesterol: 204 mg/dL — ABNORMAL HIGH (ref 0–99)
NonHDL: 270.6
Total CHOL/HDL Ratio: 7
Triglycerides: 334 mg/dL — ABNORMAL HIGH (ref 0.0–149.0)
VLDL: 66.8 mg/dL — AB (ref 0.0–40.0)

## 2014-03-25 LAB — TSH: TSH: 4.46 u[IU]/mL (ref 0.35–4.50)

## 2014-03-25 LAB — HEMOGLOBIN A1C: Hgb A1c MFr Bld: 5.9 % (ref 4.6–6.5)

## 2014-03-25 MED ORDER — AMLODIPINE BESYLATE-VALSARTAN 5-320 MG PO TABS: 1.0000 | ORAL_TABLET | Freq: Every day | ORAL | Status: DC

## 2014-03-25 NOTE — Assessment & Plan Note (Signed)
Labs

## 2014-03-25 NOTE — Assessment & Plan Note (Addendum)
Declined statins Start Krill oil

## 2014-03-25 NOTE — Assessment & Plan Note (Signed)
Check Vit D 

## 2014-03-25 NOTE — Assessment & Plan Note (Signed)
Continue with current prescription therapy as reflected on the Med list.  

## 2014-03-25 NOTE — Assessment & Plan Note (Addendum)
Pt state BP is ok at home  F/u Dr Justin Mend Chronic  Risks associated with treatment noncompliance were discussed. Compliance was encouraged. Pt is taking meds qod... Pt state BP is ok at home

## 2014-03-25 NOTE — Assessment & Plan Note (Addendum)
We discussed age appropriate health related issues, including available/recomended screening tests and vaccinations. We discussed a need for adhering to healthy diet and exercise. Labs/EKG were reviewed/ordered. All questions were answered.  Cologuard. Pt declined colonoscopy

## 2014-03-25 NOTE — Progress Notes (Signed)
   Subjective:    The patient is here for a wellness exam. The patient has been doing well overall without major physical or psychological issues going on lately.   Associated symptoms include headaches. Pertinent negatives include no chest pain, palpitations or shortness of breath. He states BP is low at times - sometimes she doesn't take her meds - every other Kayla Aguirre is taking BP meds irregular and on her own schedule    Pertinent negatives include no coughing, dizziness or rhinorrhea. Her past medical history is significant for hypertension.   F/u B12 and Vit D deficiency    BP Readings from Last 3 Encounters:  03/25/14 180/110  08/21/13 154/90  05/16/13 148/90   Wt Readings from Last 3 Encounters:  03/25/14 188 lb (85.276 kg)  08/21/13 185 lb (83.915 kg)  05/16/13 184 lb (83.462 kg)       Review of Systems  Constitutional: Negative for diaphoresis and fatigue.  HENT: Negative for congestion and rhinorrhea.   Respiratory: Negative for cough and shortness of breath.   Cardiovascular: Negative for chest pain, palpitations and leg swelling.  Genitourinary: Negative for dysuria.  Musculoskeletal: Negative for gait problem.  Skin: Negative for rash.  Neurological: Positive for headaches. Negative for dizziness and light-headedness.  Psychiatric/Behavioral: Negative for disturbed wake/sleep cycle. The patient is not nervous/anxious.        Objective:   Physical Exam  Constitutional: She appears well-developed. No distress.  HENT:  Head: Normocephalic.  Right Ear: External ear normal.  Left Ear: External ear normal.  Nose: Nose normal.  Mouth/Throat: Oropharynx is clear and moist.  Eyes: Conjunctivae are normal. Pupils are equal, round, and reactive to light. Right eye exhibits no discharge. Left eye exhibits no discharge.  Neck: Normal range of motion. Neck supple. No JVD present. No tracheal deviation present. No thyromegaly present.  Cardiovascular: Normal rate,  regular rhythm and normal heart sounds.   Pulmonary/Chest: No stridor. No respiratory distress. She has no wheezes.  Abdominal: Soft. Bowel sounds are normal. She exhibits no distension and no mass. There is no tenderness. There is no rebound and no guarding.  Musculoskeletal: She exhibits no edema and no tenderness.  Lymphadenopathy:    She has no cervical adenopathy.  Neurological: She displays normal reflexes. No cranial nerve deficit. She exhibits normal muscle tone. Coordination normal.  Skin: No rash noted. No erythema.  Psychiatric: She has a normal mood and affect. Her behavior is normal. Judgment and thought content normal.    Lab Results  Component Value Date   WBC 6.8 01/31/2012   HGB 15.0 01/31/2012   HCT 44.6 01/31/2012   PLT 227.0 01/31/2012   GLUCOSE 113* 01/31/2012   CHOL 300* 01/31/2012   TRIG 262.0* 01/31/2012   HDL 57.50 01/31/2012   LDLDIRECT 210.4 01/31/2012   ALT 44* 01/31/2012   AST 35 01/31/2012   NA 140 01/31/2012   K 4.3 01/31/2012   CL 106 01/31/2012   CREATININE 0.9 01/31/2012   BUN 12 01/31/2012   CO2 26 01/31/2012   TSH 5.67* 01/31/2012         Assessment & Plan:

## 2014-03-25 NOTE — Progress Notes (Signed)
Pre visit review using our clinic review tool, if applicable. No additional management support is needed unless otherwise documented below in the visit note. 

## 2014-03-26 ENCOUNTER — Telehealth: Payer: Self-pay | Admitting: Internal Medicine

## 2014-03-26 NOTE — Telephone Encounter (Signed)
Relevant patient education mailed to patient.  

## 2014-03-27 DIAGNOSIS — R7989 Other specified abnormal findings of blood chemistry: Secondary | ICD-10-CM | POA: Insufficient documentation

## 2014-03-27 DIAGNOSIS — D751 Secondary polycythemia: Secondary | ICD-10-CM | POA: Insufficient documentation

## 2014-03-27 DIAGNOSIS — R945 Abnormal results of liver function studies: Secondary | ICD-10-CM

## 2014-03-27 NOTE — Addendum Note (Signed)
Addended by: Cassandria Anger on: 03/27/2014 08:35 AM   Modules accepted: Orders

## 2014-03-27 NOTE — Progress Notes (Signed)
   Subjective:    Patient ID: Kayla Aguirre, female    DOB: 12-Aug-1951, 63 y.o.   MRN: [id number]  HPI    Review of Systems     Objective:   Physical Exam        Assessment & Plan:

## 2014-03-27 NOTE — Assessment & Plan Note (Signed)
7/15 mild ?etiol Labs

## 2014-03-27 NOTE — Assessment & Plan Note (Signed)
Pt informed Labs ordered - 2 wks

## 2014-06-26 ENCOUNTER — Ambulatory Visit (INDEPENDENT_AMBULATORY_CARE_PROVIDER_SITE_OTHER): Payer: BC Managed Care – PPO | Admitting: Internal Medicine

## 2014-06-26 ENCOUNTER — Other Ambulatory Visit: Payer: BC Managed Care – PPO

## 2014-06-26 ENCOUNTER — Encounter: Payer: Self-pay | Admitting: Internal Medicine

## 2014-06-26 VITALS — BP 128/88 | HR 76 | Temp 98.5°F | Resp 16 | Wt 187.0 lb

## 2014-06-26 DIAGNOSIS — E559 Vitamin D deficiency, unspecified: Secondary | ICD-10-CM

## 2014-06-26 DIAGNOSIS — E034 Atrophy of thyroid (acquired): Secondary | ICD-10-CM

## 2014-06-26 DIAGNOSIS — J069 Acute upper respiratory infection, unspecified: Secondary | ICD-10-CM

## 2014-06-26 DIAGNOSIS — E538 Deficiency of other specified B group vitamins: Secondary | ICD-10-CM

## 2014-06-26 DIAGNOSIS — D509 Iron deficiency anemia, unspecified: Secondary | ICD-10-CM

## 2014-06-26 DIAGNOSIS — I10 Essential (primary) hypertension: Secondary | ICD-10-CM

## 2014-06-26 DIAGNOSIS — E038 Other specified hypothyroidism: Secondary | ICD-10-CM

## 2014-06-26 MED ORDER — PROMETHAZINE-CODEINE 6.25-10 MG/5ML PO SYRP: 5.0000 mL | ORAL_SOLUTION | ORAL | Status: DC | PRN

## 2014-06-26 MED ORDER — CEFUROXIME AXETIL 250 MG PO TABS: 250.0000 mg | ORAL_TABLET | Freq: Two times a day (BID) | ORAL | Status: DC

## 2014-06-26 NOTE — Assessment & Plan Note (Signed)
Continue with current prescription therapy as reflected on the Med list.  

## 2014-06-26 NOTE — Assessment & Plan Note (Signed)
Continue with current prescription therapy as reflected on the Med list. BP Readings from Last 3 Encounters:  06/26/14 128/88  03/25/14 180/110  08/21/13 154/90

## 2014-06-26 NOTE — Assessment & Plan Note (Signed)
Continue with current Vit D therapy as reflected on the Med list.  

## 2014-06-26 NOTE — Assessment & Plan Note (Signed)
10/15 Ceftin Rx Prom/cod syr

## 2014-06-26 NOTE — Progress Notes (Signed)
Pre visit review using our clinic review tool, if applicable. No additional management support is needed unless otherwise documented below in the visit note. 

## 2014-06-30 ENCOUNTER — Other Ambulatory Visit (INDEPENDENT_AMBULATORY_CARE_PROVIDER_SITE_OTHER): Payer: BC Managed Care – PPO

## 2014-06-30 DIAGNOSIS — E039 Hypothyroidism, unspecified: Secondary | ICD-10-CM | POA: Diagnosis not present

## 2014-06-30 DIAGNOSIS — Z0000 Encounter for general adult medical examination without abnormal findings: Secondary | ICD-10-CM | POA: Diagnosis not present

## 2014-06-30 DIAGNOSIS — D751 Secondary polycythemia: Secondary | ICD-10-CM

## 2014-06-30 DIAGNOSIS — E785 Hyperlipidemia, unspecified: Secondary | ICD-10-CM | POA: Diagnosis not present

## 2014-06-30 DIAGNOSIS — R7989 Other specified abnormal findings of blood chemistry: Secondary | ICD-10-CM

## 2014-06-30 DIAGNOSIS — R945 Abnormal results of liver function studies: Secondary | ICD-10-CM

## 2014-06-30 LAB — IBC PANEL
IRON: 67 ug/dL (ref 42–145)
Saturation Ratios: 18.5 % — ABNORMAL LOW (ref 20.0–50.0)
TRANSFERRIN: 258.9 mg/dL (ref 212.0–360.0)

## 2014-06-30 LAB — LDL CHOLESTEROL, DIRECT: Direct LDL: 205.2 mg/dL

## 2014-06-30 LAB — CBC WITH DIFFERENTIAL/PLATELET
BASOS PCT: 0.6 % (ref 0.0–3.0)
Basophils Absolute: 0 10*3/uL (ref 0.0–0.1)
EOS ABS: 0 10*3/uL (ref 0.0–0.7)
Eosinophils Relative: 0.6 % (ref 0.0–5.0)
HCT: 45.6 % (ref 36.0–46.0)
Hemoglobin: 15.5 g/dL — ABNORMAL HIGH (ref 12.0–15.0)
LYMPHS PCT: 30.3 % (ref 12.0–46.0)
Lymphs Abs: 2.1 10*3/uL (ref 0.7–4.0)
MCHC: 34.1 g/dL (ref 30.0–36.0)
MCV: 83.4 fl (ref 78.0–100.0)
Monocytes Absolute: 0.5 10*3/uL (ref 0.1–1.0)
Monocytes Relative: 6.7 % (ref 3.0–12.0)
NEUTROS PCT: 61.8 % (ref 43.0–77.0)
Neutro Abs: 4.2 10*3/uL (ref 1.4–7.7)
Platelets: 231 10*3/uL (ref 150.0–400.0)
RBC: 5.47 Mil/uL — AB (ref 3.87–5.11)
RDW: 13.3 % (ref 11.5–15.5)
WBC: 6.9 10*3/uL (ref 4.0–10.5)

## 2014-06-30 LAB — LIPID PANEL
Cholesterol: 302 mg/dL — ABNORMAL HIGH (ref 0–200)
HDL: 42.4 mg/dL (ref >39.00)
NonHDL: 259.6
TRIGLYCERIDES: 218 mg/dL — AB (ref 0.0–149.0)
Total CHOL/HDL Ratio: 7
VLDL: 43.6 mg/dL — ABNORMAL HIGH (ref 0.0–40.0)

## 2014-06-30 LAB — HEPATIC FUNCTION PANEL
ALT: 55 U/L — ABNORMAL HIGH (ref 0–35)
AST: 39 U/L — ABNORMAL HIGH (ref 0–37)
Albumin: 3.5 g/dL (ref 3.5–5.2)
Alkaline Phosphatase: 91 U/L (ref 39–117)
BILIRUBIN TOTAL: 1.4 mg/dL — AB (ref 0.2–1.2)
Bilirubin, Direct: 0.1 mg/dL (ref 0.0–0.3)
Total Protein: 7.8 g/dL (ref 6.0–8.3)

## 2014-06-30 LAB — SEDIMENTATION RATE: Sed Rate: 39 mm/hr — ABNORMAL HIGH (ref 0–22)

## 2014-07-01 LAB — ANA: ANA: NEGATIVE

## 2014-07-01 LAB — HEPATITIS B SURFACE ANTIBODY,QUALITATIVE: HEP B S AB: NEGATIVE

## 2014-07-01 LAB — HEPATITIS C ANTIBODY: HCV AB: NEGATIVE

## 2014-07-01 LAB — HEPATITIS B SURFACE ANTIGEN: Hepatitis B Surface Ag: NEGATIVE

## 2014-07-02 LAB — CERULOPLASMIN: CERULOPLASMIN: 29 mg/dL (ref 18–53)

## 2014-07-15 ENCOUNTER — Encounter: Payer: Self-pay | Admitting: Internal Medicine

## 2014-07-15 ENCOUNTER — Other Ambulatory Visit: Payer: Self-pay | Admitting: Internal Medicine

## 2014-07-15 DIAGNOSIS — R7989 Other specified abnormal findings of blood chemistry: Secondary | ICD-10-CM

## 2014-07-15 DIAGNOSIS — R945 Abnormal results of liver function studies: Principal | ICD-10-CM

## 2014-07-17 ENCOUNTER — Ambulatory Visit (INDEPENDENT_AMBULATORY_CARE_PROVIDER_SITE_OTHER): Payer: BC Managed Care – PPO | Admitting: Internal Medicine

## 2014-07-17 ENCOUNTER — Encounter: Payer: Self-pay | Admitting: Internal Medicine

## 2014-07-17 VITALS — BP 138/82 | HR 92 | Ht 65.0 in | Wt 185.4 lb

## 2014-07-17 DIAGNOSIS — K824 Cholesterolosis of gallbladder: Secondary | ICD-10-CM

## 2014-07-17 DIAGNOSIS — K760 Fatty (change of) liver, not elsewhere classified: Secondary | ICD-10-CM

## 2014-07-17 DIAGNOSIS — R7989 Other specified abnormal findings of blood chemistry: Secondary | ICD-10-CM

## 2014-07-17 DIAGNOSIS — Z1211 Encounter for screening for malignant neoplasm of colon: Secondary | ICD-10-CM

## 2014-07-17 DIAGNOSIS — R945 Abnormal results of liver function studies: Principal | ICD-10-CM

## 2014-07-17 NOTE — Progress Notes (Signed)
Patient ID: Kayla Aguirre, female   DOB: [birth year], 63 y.o.   MRN: [id number] HPI: Kayla Aguirre is a 63 year old female from [place] with a past medical history of hypertension, and elevated liver transaminases who is seen in consultation at the request of Dr. Alain Marion evaluate elevated liver enzymes. She is here alone today. She reports having had "hepatitis" when she was 63 years old. She denies jaundice. No family history of liver disease. She rarely drinks alcohol, approximately 2-3 times per year. She denies abdominal pain. No nausea or vomiting. Heartburn. No dysphagia or odynophagia. No itching. Normal bowel habits without blood in her stool or melena. No easy bleeding or bruising. She does report over the last 5-6 years she has gained 15-20 pounds which she relates to inactivity. The only medication that she is taking his accommodation amlodipine and valsartan. It appears that she was previously on levothyroxine but is no longer taking this medicine. She does occasionally use loratadine but denies other over-the-counter medicines or herbal supplements.  Past Medical History  Diagnosis Date  . Hypertension     History reviewed. No pertinent past surgical history.  Outpatient Prescriptions Prior to Visit  Medication Sig Dispense Refill  . cefUROXime (CEFTIN) 250 MG tablet Take 1 tablet (250 mg total) by mouth 2 (two) times daily.  20 tablet  0  . Cyanocobalamin (VITAMIN B-12) 1000 MCG SUBL Place 1 tablet (1,000 mcg total) under the tongue daily.  30 tablet  3  . diclofenac sodium (VOLTAREN) 1 % GEL Apply 2 g topically 4 (four) times daily.  100 g  3  . promethazine-codeine (PHENERGAN WITH CODEINE) 6.25-10 MG/5ML syrup Take 5 mLs by mouth every 4 (four) hours as needed.  300 mL  0  . amLODipine-valsartan (EXFORGE) 5-320 MG per tablet Take 1 tablet by mouth daily.  30 tablet  11  . levothyroxine (LEVOTHROID) 25 MCG tablet Take 1 tablet (25 mcg total) by mouth daily.  30 tablet  3  .  loratadine (CLARITIN) 10 MG tablet Take 1 tablet (10 mg total) by mouth daily.  100 tablet  3  . zolpidem (AMBIEN) 10 MG tablet Take 1 tablet (10 mg total) by mouth at bedtime as needed.  30 tablet  3   No facility-administered medications prior to visit.    Allergies  Allergen Reactions  . Erythromycin   . Hydrochlorothiazide     Family History  Problem Relation Age of Onset  . Diabetes Father   . Stroke Father   . Myelodysplastic syndrome Mother   . Cancer Mother     MM  . Colon cancer Neg Hx   . Colon polyps Neg Hx   . Kidney disease Neg Hx     History  Substance Use Topics  . Smoking status: Never Smoker   . Smokeless tobacco: Not on file  . Alcohol Use: No    ROS: As per history of present illness, otherwise negative  BP 138/82  Pulse 92  Ht 5\' 5"  (1.651 m)  Wt 185 lb 6 oz (84.086 kg)  BMI 30.85 kg/m2 Constitutional: Well-developed and well-nourished. No distress. HEENT: Normocephalic and atraumatic. Oropharynx is clear and moist. No oropharyngeal exudate. Conjunctivae are normal.  No scleral icterus. Neck: Neck supple. Trachea midline. Cardiovascular: Normal rate, regular rhythm and intact distal pulses. No M/R/G Pulmonary/chest: Effort normal and breath sounds normal. No wheezing, rales or rhonchi. Abdominal: Soft, nontender, nondistended. Bowel sounds active throughout. There are no masses palpable. No hepatosplenomegaly. Extremities: no clubbing, cyanosis, or edema  Lymphadenopathy: No cervical adenopathy noted. Neurological: Alert and oriented to person place and time. Skin: Skin is warm and dry. No rashes noted. Psychiatric: Normal mood and affect. Behavior is normal.  RELEVANT LABS AND IMAGING: CBC    Component Value Date/Time   WBC 6.9 06/30/2014 1307   RBC 5.47* 06/30/2014 1307   HGB 15.5* 06/30/2014 1307   HCT 45.6 06/30/2014 1307   PLT 231.0 06/30/2014 1307   MCV 83.4 06/30/2014 1307   MCHC 34.1 06/30/2014 1307   RDW 13.3 06/30/2014 1307    LYMPHSABS 2.1 06/30/2014 1307   MONOABS 0.5 06/30/2014 1307   EOSABS 0.0 06/30/2014 1307   BASOSABS 0.0 06/30/2014 1307    CMP     Component Value Date/Time   NA 139 03/25/2014 0931   K 4.2 03/25/2014 0931   CL 105 03/25/2014 0931   CO2 26 03/25/2014 0931   GLUCOSE 135* 03/25/2014 0931   BUN 13 03/25/2014 0931   CREATININE 0.9 03/25/2014 0931   CALCIUM 9.4 03/25/2014 0931   PROT 7.8 06/30/2014 1307   ALBUMIN 3.5 06/30/2014 1307   AST 39* 06/30/2014 1307   ALT 55* 06/30/2014 1307   ALKPHOS 91 06/30/2014 1307   BILITOT 1.4* 06/30/2014 1307   GFRNONAA 69 06/04/2008 0821   GFRAA 83 06/04/2008 0821   ANA neg, iron studies not elevated, ceruloplasmin normal, hepatitis B and C negative, TSH normal  Results for RAVENNA, LEGORE (MRN [id number]) as of 07/17/2014 13:08  Ref. Range 06/14/2011 07:35 08/18/2011 12:24 01/31/2012 07:34 05/08/2012 09:06 08/09/2012 09:38 11/13/2012 09:16 05/16/2013 09:15 03/25/2014 09:31 06/30/2014 13:07  Alkaline Phosphatase Latest Range: 39-117 U/L 79 81 89     87 91  Albumin Latest Range: 3.5-5.2 g/dL 3.9 4.0 3.8     4.0 3.5  AST Latest Range: 0-37 U/L 31 29 35     49 (H) 39 (H)  ALT Latest Range: 0-35 U/L 43 (H) 40 (H) 44 (H)     76 (H) 55 (H)  Total Protein Latest Range: 6.0-8.3 g/dL 7.2 7.2 7.3     7.6 7.8  Bilirubin, Direct Latest Range: 0.0-0.3 mg/dL  0.1 0.2     0.1 0.1  Total Bilirubin Latest Range: 0.2-1.2 mg/dL 1.3 (H) 1.2 1.2     1.1 1.4 (H)   COMPLETE ABDOMINAL ULTRASOUND -- Dec 2012   Comparison:  None.   Findings:   Gallbladder:  3 mm echogenic focus non dependently, may represent a small polyp.  No layering gallstones or shadowing stones.  No gallbladder wall thickening or pericholecystic fluid.  Negative sonographic Murphy's sign.   Common bile duct:  Measures 2 mm proximally with smooth tapering.   Liver:  Heterogeneous echogenicity without focal abnormality.   IVC:  Obscured intrahepatic segment.   Pancreas:  Poorly visualized due to overlying bowel  gas artifact.   Spleen:  Normal sonographic appearance, measuring 5.4 cm oblique.   Right Kidney:  Measures 10.9 cm.  No hydronephrosis.  No focal abnormality.   Left Kidney:  Measures 10.4 cm.  No hydronephrosis.  No focal abnormality.   Abdominal aorta:  Scattered atherosclerotic disease noted. Measures up to 2.8 cm in diameter where seen.   IMPRESSION: Heterogeneous liver echogenicity is nonspecific, can be seen with hepatic steatosis.  No focal abnormality identified.   Symmetric renal size.  No hydronephrosis. Note that this is not a vascular study to evaluate renal arteries.   Tiny gallbladder polyp.   Atherosclerotic calcification of the aorta and mild distal ectasia.  ASSESSMENT/PLAN:  63 year old female from [place] with a past medical history of hypertension, and elevated liver transaminases who is seen in consultation at the request of Dr. Alain Marion evaluate elevated liver enzymes.  1. Elevated liver transaminases -- mild and persistent over the last 3-4 years. She has a mild elevation in serum bilirubin which is likely secondary Gillbert's as it has been persistent.  The most likely cause of her mild elevation in AST and ALT is fatty liver disease. Liver ultrasound from 2012 is reviewed and does show increased liver echogenicity which is likely related to hepatic steatosis. She's also gained weight and has been less active also risk factors for fatty liver. We had a long discussion regarding fatty liver disease today. I have recommended increasing exercise and reducing caloric intake in an attempt to slowly reduce weight by 10%. It appears that she is eating butter with most everything she cooks and eating chocolate on a daily basis. I asked that she try to reduce her intake of these foods. I will start her on vitamin E 800 international units daily. Also will repeat abdominal ultrasound to evaluate the liver. There is no signs of chronic liver disease or cirrhosis. There is  no sign for autoimmune liver disease at this time. We did discuss that fatty liver disease is the #1 cause of cirrhosis in the [place] and that while it does not appear her liver disease is advanced at present we do want to take steps to aggressively address the problem and try to normalize liver enzymes. She voices understanding.  2. Gallbladder polyp -- small seen in 2012, will be reevaluated by ultrasound is ordered  3. Colorectal cancer screening -- I recommended colonoscopy. She declines for unclear reasons. We also discussed cologuard as a noninvasive at home stool based test. She declines this test for now as well. I asked that she think strongly about colorectal cancer screening and let me know if she changes her mind and to discuss this further with primary care

## 2014-07-17 NOTE — Patient Instructions (Signed)
You have been scheduled for an abdominal ultrasound at [facility] (1st floor of hospital) on 07/22/14 at 8 am. Please arrive 15 minutes prior to your appointment for registration. Make certain not to have anything to eat or drink 6 hours prior to your appointment. Should you need to reschedule your appointment, please contact radiology at [phone]. This test typically takes about 30 minutes to perform. Please start taking Vitamin E daily 800 IU. Work on [employer] 10% by decreasing fat, increase exercise. Dr Hilarie Fredrickson has recommended cologuard and colonoscopy please think about these test and call if you decide to schedule. Your last TSH was 4.46 3 months ago. CC:  Lew Dawes MD

## 2014-07-22 ENCOUNTER — Ambulatory Visit (HOSPITAL_COMMUNITY)
Admission: RE | Admit: 2014-07-22 | Discharge: 2014-07-22 | Disposition: A | Discharge status: Home / Self Care | Payer: BC Managed Care – PPO | Source: Ambulatory Visit | Attending: Diagnostic Radiology | Admitting: Diagnostic Radiology

## 2014-07-22 DIAGNOSIS — K760 Fatty (change of) liver, not elsewhere classified: Secondary | ICD-10-CM | POA: Insufficient documentation

## 2014-07-22 DIAGNOSIS — R7989 Other specified abnormal findings of blood chemistry: Secondary | ICD-10-CM | POA: Diagnosis present

## 2014-07-22 DIAGNOSIS — R945 Abnormal results of liver function studies: Secondary | ICD-10-CM

## 2014-09-15 ENCOUNTER — Encounter: Payer: Self-pay | Admitting: Internal Medicine

## 2014-09-15 ENCOUNTER — Ambulatory Visit (INDEPENDENT_AMBULATORY_CARE_PROVIDER_SITE_OTHER): Payer: BC Managed Care – PPO | Admitting: Internal Medicine

## 2014-09-15 ENCOUNTER — Other Ambulatory Visit: Payer: Self-pay | Admitting: *Deleted

## 2014-09-15 VITALS — BP 130/90 | HR 83 | Temp 98.0°F | Wt 177.0 lb

## 2014-09-15 DIAGNOSIS — D751 Secondary polycythemia: Secondary | ICD-10-CM

## 2014-09-15 DIAGNOSIS — D509 Iron deficiency anemia, unspecified: Secondary | ICD-10-CM

## 2014-09-15 DIAGNOSIS — Z23 Encounter for immunization: Secondary | ICD-10-CM

## 2014-09-15 DIAGNOSIS — I10 Essential (primary) hypertension: Secondary | ICD-10-CM

## 2014-09-15 DIAGNOSIS — E538 Deficiency of other specified B group vitamins: Secondary | ICD-10-CM

## 2014-09-15 DIAGNOSIS — E785 Hyperlipidemia, unspecified: Secondary | ICD-10-CM

## 2014-09-15 DIAGNOSIS — E559 Vitamin D deficiency, unspecified: Secondary | ICD-10-CM

## 2014-09-15 DIAGNOSIS — R7989 Other specified abnormal findings of blood chemistry: Secondary | ICD-10-CM

## 2014-09-15 DIAGNOSIS — R7309 Other abnormal glucose: Secondary | ICD-10-CM

## 2014-09-15 DIAGNOSIS — R945 Abnormal results of liver function studies: Secondary | ICD-10-CM

## 2014-09-15 MED ORDER — ATORVASTATIN CALCIUM 10 MG PO TABS: 10.0000 mg | ORAL_TABLET | Freq: Every day | ORAL | Status: DC

## 2014-09-15 NOTE — Assessment & Plan Note (Signed)
Continue with current prescription therapy as reflected on the Med list.  

## 2014-09-15 NOTE — Assessment & Plan Note (Signed)
Start Lipitor.

## 2014-09-15 NOTE — Assessment & Plan Note (Signed)
Vit E Started Lipitor Labs in 1 mo

## 2014-09-15 NOTE — Progress Notes (Signed)
   Subjective:     F/u Hypertension   Associated symptoms include headaches. Pertinent negatives include no chest pain, palpitations or shortness of breath. He states BP is low at times - sometimes she doesn't take her meds - every other Kayla Aguirre is taking BP meds irregular and on her own schedule    Pertinent negatives include no coughing, dizziness or rhinorrhea. Her past medical history is significant for hypertension.   F/u B12 and Vit D deficiency   F/u eye redness - resolved    BP Readings from Last 3 Encounters:  09/15/14 130/90  07/17/14 138/82  06/26/14 128/88   Wt Readings from Last 3 Encounters:  09/15/14 177 lb (80.287 kg)  07/17/14 185 lb 6 oz (84.086 kg)  06/26/14 187 lb (84.823 kg)       Review of Systems  Constitutional: Negative for diaphoresis and fatigue.  HENT: Negative for congestion and rhinorrhea.   Respiratory: Negative for cough and shortness of breath.   Cardiovascular: Negative for chest pain, palpitations and leg swelling.  Genitourinary: Negative for dysuria.  Musculoskeletal: Negative for gait problem.  Skin: Negative for rash.  Neurological: Positive for headaches. Negative for dizziness and light-headedness.  Psychiatric/Behavioral: Negative for disturbed wake/sleep cycle. The patient is not nervous/anxious.        Objective:   Physical Exam  Constitutional: She appears well-developed. No distress.  HENT:  Head: Normocephalic.  Right Ear: External ear normal.  Left Ear: External ear normal.  Nose: Nose normal.  Mouth/Throat: Oropharynx is clear and moist.  Eyes: Conjunctivae are normal. Pupils are equal, round, and reactive to light. Right eye exhibits no discharge. Left eye exhibits no discharge.  Neck: Normal range of motion. Neck supple. No JVD present. No tracheal deviation present. No thyromegaly present.  Cardiovascular: Normal rate, regular rhythm and normal heart sounds.   Pulmonary/Chest: No stridor. No respiratory  distress. She has no wheezes.  Abdominal: Soft. Bowel sounds are normal. She exhibits no distension and no mass. There is no tenderness. There is no rebound and no guarding.  Musculoskeletal: She exhibits no edema and no tenderness.  Lymphadenopathy:    She has no cervical adenopathy.  Neurological: She displays normal reflexes. No cranial nerve deficit. She exhibits normal muscle tone. Coordination normal.  Skin: No rash noted. No erythema.  Psychiatric: She has a normal mood and affect. Her behavior is normal. Judgment and thought content normal.    Lab Results  Component Value Date   WBC 6.8 01/31/2012   HGB 15.0 01/31/2012   HCT 44.6 01/31/2012   PLT 227.0 01/31/2012   GLUCOSE 113* 01/31/2012   CHOL 300* 01/31/2012   TRIG 262.0* 01/31/2012   HDL 57.50 01/31/2012   LDLDIRECT 210.4 01/31/2012   ALT 44* 01/31/2012   AST 35 01/31/2012   NA 140 01/31/2012   K 4.3 01/31/2012   CL 106 01/31/2012   CREATININE 0.9 01/31/2012   BUN 12 01/31/2012   CO2 26 01/31/2012   TSH 5.67* 01/31/2012         Assessment & Plan:

## 2014-09-15 NOTE — Progress Notes (Signed)
Pre visit review using our clinic review tool, if applicable. No additional management support is needed unless otherwise documented below in the visit note. 

## 2014-09-15 NOTE — Assessment & Plan Note (Addendum)
Possible OSA Sleep test suggested

## 2014-09-16 ENCOUNTER — Telehealth: Payer: Self-pay | Admitting: Internal Medicine

## 2014-09-16 NOTE — Telephone Encounter (Signed)
emmi emailed °

## 2014-12-15 ENCOUNTER — Other Ambulatory Visit (INDEPENDENT_AMBULATORY_CARE_PROVIDER_SITE_OTHER): Payer: 59

## 2014-12-15 ENCOUNTER — Ambulatory Visit (INDEPENDENT_AMBULATORY_CARE_PROVIDER_SITE_OTHER): Payer: 59 | Admitting: Internal Medicine

## 2014-12-15 ENCOUNTER — Encounter: Payer: Self-pay | Admitting: Internal Medicine

## 2014-12-15 VITALS — BP 160/90 | HR 84 | Wt 185.0 lb

## 2014-12-15 DIAGNOSIS — I10 Essential (primary) hypertension: Secondary | ICD-10-CM | POA: Diagnosis not present

## 2014-12-15 DIAGNOSIS — E785 Hyperlipidemia, unspecified: Secondary | ICD-10-CM

## 2014-12-15 DIAGNOSIS — E538 Deficiency of other specified B group vitamins: Secondary | ICD-10-CM

## 2014-12-15 DIAGNOSIS — E559 Vitamin D deficiency, unspecified: Secondary | ICD-10-CM

## 2014-12-15 LAB — T4, FREE: Free T4: 0.65 ng/dL (ref 0.60–1.60)

## 2014-12-15 LAB — BASIC METABOLIC PANEL
BUN: 17 mg/dL (ref 6–23)
CO2: 25 meq/L (ref 19–32)
CREATININE: 0.91 mg/dL (ref 0.40–1.20)
Calcium: 9.3 mg/dL (ref 8.4–10.5)
Chloride: 105 mEq/L (ref 96–112)
GFR: 66.41 mL/min (ref >60.00)
Glucose, Bld: 120 mg/dL — ABNORMAL HIGH (ref 70–99)
POTASSIUM: 4.4 meq/L (ref 3.5–5.1)
Sodium: 137 mEq/L (ref 135–145)

## 2014-12-15 LAB — CBC WITH DIFFERENTIAL/PLATELET
BASOS ABS: 0 10*3/uL (ref 0.0–0.1)
BASOS PCT: 0.7 % (ref 0.0–3.0)
Eosinophils Absolute: 0.1 10*3/uL (ref 0.0–0.7)
Eosinophils Relative: 0.8 % (ref 0.0–5.0)
HCT: 43.9 % (ref 36.0–46.0)
Hemoglobin: 15.3 g/dL — ABNORMAL HIGH (ref 12.0–15.0)
LYMPHS PCT: 38.5 % (ref 12.0–46.0)
Lymphs Abs: 2.7 10*3/uL (ref 0.7–4.0)
MCHC: 34.9 g/dL (ref 30.0–36.0)
MCV: 81.9 fl (ref 78.0–100.0)
MONOS PCT: 7.4 % (ref 3.0–12.0)
Monocytes Absolute: 0.5 10*3/uL (ref 0.1–1.0)
NEUTROS PCT: 52.6 % (ref 43.0–77.0)
Neutro Abs: 3.7 10*3/uL (ref 1.4–7.7)
Platelets: 207 10*3/uL (ref 150.0–400.0)
RBC: 5.36 Mil/uL — AB (ref 3.87–5.11)
RDW: 13.1 % (ref 11.5–15.5)
WBC: 7.1 10*3/uL (ref 4.0–10.5)

## 2014-12-15 LAB — LIPID PANEL
Cholesterol: 288 mg/dL — ABNORMAL HIGH (ref 0–200)
HDL: 53.4 mg/dL (ref >39.00)
LDL Cholesterol: 203 mg/dL — ABNORMAL HIGH (ref 0–99)
NonHDL: 234.6
TRIGLYCERIDES: 156 mg/dL — AB (ref 0.0–149.0)
Total CHOL/HDL Ratio: 5
VLDL: 31.2 mg/dL (ref 0.0–40.0)

## 2014-12-15 LAB — VITAMIN D 25 HYDROXY (VIT D DEFICIENCY, FRACTURES): VITD: 15.62 ng/mL — AB (ref 30.00–100.00)

## 2014-12-15 LAB — VITAMIN B12: Vitamin B-12: 261 pg/mL (ref 211–911)

## 2014-12-15 LAB — TSH: TSH: 8.92 u[IU]/mL — AB (ref 0.35–4.50)

## 2014-12-15 MED ORDER — AMLODIPINE BESYLATE-VALSARTAN 5-320 MG PO TABS: 1.0000 | ORAL_TABLET | ORAL | Status: DC | PRN

## 2014-12-15 NOTE — Assessment & Plan Note (Signed)
Not taking meds Lipids

## 2014-12-15 NOTE — Assessment & Plan Note (Signed)
On B12 Risks associated with treatment noncompliance were discussed. Compliance was encouraged. 

## 2014-12-15 NOTE — Assessment & Plan Note (Signed)
Not taking Risks associated with treatment noncompliance were discussed. Compliance was encouraged.

## 2014-12-15 NOTE — Assessment & Plan Note (Signed)
Continue with current Exforge prescription therapy as reflected on the Med list. Inconsistent w/Rx

## 2014-12-15 NOTE — Progress Notes (Signed)
   Subjective:     F/u Hypertension       Pertinent negatives include no coughing, dizziness or rhinorrhea. Her past medical history is significant for hypertension.   F/u B12 and Vit D deficiency - not taking Rx's  F/u hypothyroidism, dyslipidemia - not taking Rx     F/u eye redness - resolved    BP Readings from Last 3 Encounters:  12/15/14 160/90  09/15/14 130/90  07/17/14 138/82   Wt Readings from Last 3 Encounters:  12/15/14 185 lb (83.915 kg)  09/15/14 177 lb (80.287 kg)  07/17/14 185 lb 6 oz (84.086 kg)       Review of Systems  Constitutional: Negative for diaphoresis and fatigue.  HENT: Negative for congestion and rhinorrhea.   Respiratory: Negative for cough and shortness of breath.   Cardiovascular: Negative for chest pain, palpitations and leg swelling.  Genitourinary: Negative for dysuria.  Musculoskeletal: Negative for gait problem.  Skin: Negative for rash.  Neurological: Positive for headaches. Negative for dizziness and light-headedness.  Psychiatric/Behavioral: Negative for disturbed wake/sleep cycle. The patient is not nervous/anxious.        Objective:   Physical Exam  Constitutional: She appears well-developed. No distress.  HENT:  Head: Normocephalic.  Right Ear: External ear normal.  Left Ear: External ear normal.  Nose: Nose normal.  Mouth/Throat: Oropharynx is clear and moist.  Eyes: Conjunctivae are normal. Pupils are equal, round, and reactive to light. Right eye exhibits no discharge. Left eye exhibits no discharge.  Neck: Normal range of motion. Neck supple. No JVD present. No tracheal deviation present. No thyromegaly present.  Cardiovascular: Normal rate, regular rhythm and normal heart sounds.   Pulmonary/Chest: No stridor. No respiratory distress. She has no wheezes.  Abdominal: Soft. Bowel sounds are normal. She exhibits no distension and no mass. There is no tenderness. There is no rebound and no guarding.   Musculoskeletal: She exhibits no edema and no tenderness.  Lymphadenopathy:    She has no cervical adenopathy.  Neurological: She displays normal reflexes. No cranial nerve deficit. She exhibits normal muscle tone. Coordination normal.  Skin: No rash noted. No erythema.  Psychiatric: She has a normal mood and affect. Her behavior is normal. Judgment and thought content normal.    Lab Results  Component Value Date   WBC 6.8 01/31/2012   HGB 15.0 01/31/2012   HCT 44.6 01/31/2012   PLT 227.0 01/31/2012   GLUCOSE 113* 01/31/2012   CHOL 300* 01/31/2012   TRIG 262.0* 01/31/2012   HDL 57.50 01/31/2012   LDLDIRECT 210.4 01/31/2012   ALT 44* 01/31/2012   AST 35 01/31/2012   NA 140 01/31/2012   K 4.3 01/31/2012   CL 106 01/31/2012   CREATININE 0.9 01/31/2012   BUN 12 01/31/2012   CO2 26 01/31/2012   TSH 5.67* 01/31/2012         Assessment & Plan:

## 2014-12-15 NOTE — Progress Notes (Signed)
Pre visit review using our clinic review tool, if applicable. No additional management support is needed unless otherwise documented below in the visit note. 

## 2014-12-23 ENCOUNTER — Telehealth: Payer: Self-pay | Admitting: *Deleted

## 2014-12-23 NOTE — Telephone Encounter (Signed)
Exforge 5-320 mg PA is approved until 12/23/15. Pharmacy informed. Pt ID # [id number].

## 2015-03-16 ENCOUNTER — Encounter: Payer: Self-pay | Admitting: Internal Medicine

## 2015-03-16 ENCOUNTER — Ambulatory Visit (INDEPENDENT_AMBULATORY_CARE_PROVIDER_SITE_OTHER): Payer: 59 | Admitting: Internal Medicine

## 2015-03-16 VITALS — BP 160/101 | HR 85 | Temp 97.7°F | Wt 186.0 lb

## 2015-03-16 DIAGNOSIS — I10 Essential (primary) hypertension: Secondary | ICD-10-CM

## 2015-03-16 DIAGNOSIS — E038 Other specified hypothyroidism: Secondary | ICD-10-CM

## 2015-03-16 DIAGNOSIS — Z9119 Patient's noncompliance with other medical treatment and regimen: Secondary | ICD-10-CM | POA: Diagnosis not present

## 2015-03-16 DIAGNOSIS — Z91199 Patient's noncompliance with other medical treatment and regimen due to unspecified reason: Secondary | ICD-10-CM

## 2015-03-16 DIAGNOSIS — E034 Atrophy of thyroid (acquired): Secondary | ICD-10-CM

## 2015-03-16 DIAGNOSIS — Z0000 Encounter for general adult medical examination without abnormal findings: Secondary | ICD-10-CM | POA: Diagnosis not present

## 2015-03-16 MED ORDER — AMLODIPINE BESYLATE-VALSARTAN 5-320 MG PO TABS: 1.0000 | ORAL_TABLET | ORAL | Status: DC | PRN

## 2015-03-16 NOTE — Assessment & Plan Note (Signed)
Chronic - not taking Rx 2012 Risks associated with treatment noncompliance were discussed. Compliance was encouraged.

## 2015-03-16 NOTE — Progress Notes (Signed)
Pre visit review using our clinic review tool, if applicable. No additional management support is needed unless otherwise documented below in the visit note. 

## 2015-03-16 NOTE — Assessment & Plan Note (Signed)
Chronic issue - some fears of taking meds due to potential harm from the "chemichals"  Pt states BP is low at times - pt doesn't take her meds as prescribed - every other day 1/2 tab

## 2015-03-16 NOTE — Assessment & Plan Note (Signed)
We discussed age appropriate health related issues, including available/recomended screening tests and vaccinations. We discussed a need for adhering to healthy diet and exercise. Labs/EKG were reviewed/ordered. All questions were answered.  Labs 

## 2015-03-16 NOTE — Progress Notes (Signed)
   Subjective:    The patient is here for a wellness exam. The patient has been doing well overall without major physical or psychological issues going on lately.  Kayla Aguirre states BP is low at times - Kayla Aguirre doesn't take her meds as prescribed - every other day 1/2 tab Namya is taking BP meds irregular and on her own schedule  SBP 140 at home  Pertinent negatives include no coughing, dizziness or rhinorrhea. Her past medical history is significant for hypertension.   F/u B12 and Vit D deficiency. Kayla Aguirre refused high dose Vit D. Improve compliance    BP Readings from Last 3 Encounters:  03/16/15 160/101  12/15/14 160/90  09/15/14 130/90   Wt Readings from Last 3 Encounters:  03/16/15 186 lb (84.369 kg)  12/15/14 185 lb (83.915 kg)  09/15/14 177 lb (80.287 kg)       Review of Systems  Constitutional: Negative for diaphoresis and fatigue.  HENT: Negative for congestion and rhinorrhea.   Respiratory: Negative for cough and shortness of breath.   Cardiovascular: Negative for chest pain, palpitations and leg swelling.  Genitourinary: Negative for dysuria.  Musculoskeletal: Negative for gait problem.  Skin: Negative for rash.  Neurological: Positive for headaches. Negative for dizziness and light-headedness.  Psychiatric/Behavioral: Negative for disturbed wake/sleep cycle. The patient is not nervous/anxious.        Objective:   Physical Exam  Constitutional: She appears well-developed. No distress.  HENT:  Head: Normocephalic.  Right Ear: External ear normal.  Left Ear: External ear normal.  Nose: Nose normal.  Mouth/Throat: Oropharynx is clear and moist.  Eyes: Conjunctivae are normal. Pupils are equal, round, and reactive to light. Right eye exhibits no discharge. Left eye exhibits no discharge.  Neck: Normal range of motion. Neck supple. No JVD present. No tracheal deviation present. No thyromegaly present.  Cardiovascular: Normal rate, regular rhythm and normal heart sounds.    Pulmonary/Chest: No stridor. No respiratory distress. She has no wheezes.  Abdominal: Soft. Bowel sounds are normal. She exhibits no distension and no mass. There is no tenderness. There is no rebound and no guarding.  Musculoskeletal: She exhibits no edema and no tenderness.  Lymphadenopathy:    She has no cervical adenopathy.  Neurological: She displays normal reflexes. No cranial nerve deficit. She exhibits normal muscle tone. Coordination normal.  Skin: No rash noted. No erythema.  Psychiatric: She has a normal mood and affect. Her behavior is normal. Judgment and thought content normal.    Lab Results  Component Value Date   WBC 6.8 01/31/2012   HGB 15.0 01/31/2012   HCT 44.6 01/31/2012   PLT 227.0 01/31/2012   GLUCOSE 113* 01/31/2012   CHOL 300* 01/31/2012   TRIG 262.0* 01/31/2012   HDL 57.50 01/31/2012   LDLDIRECT 210.4 01/31/2012   ALT 44* 01/31/2012   AST 35 01/31/2012   NA 140 01/31/2012   K 4.3 01/31/2012   CL 106 01/31/2012   CREATININE 0.9 01/31/2012   BUN 12 01/31/2012   CO2 26 01/31/2012   TSH 5.67* 01/31/2012         Assessment & Plan:

## 2015-07-16 ENCOUNTER — Ambulatory Visit (INDEPENDENT_AMBULATORY_CARE_PROVIDER_SITE_OTHER): Payer: 59 | Admitting: Internal Medicine

## 2015-07-16 ENCOUNTER — Encounter: Payer: Self-pay | Admitting: Internal Medicine

## 2015-07-16 ENCOUNTER — Other Ambulatory Visit (INDEPENDENT_AMBULATORY_CARE_PROVIDER_SITE_OTHER): Payer: 59

## 2015-07-16 ENCOUNTER — Other Ambulatory Visit: Payer: Self-pay | Admitting: Internal Medicine

## 2015-07-16 VITALS — BP 138/90 | HR 94 | Wt 186.0 lb

## 2015-07-16 DIAGNOSIS — E559 Vitamin D deficiency, unspecified: Secondary | ICD-10-CM | POA: Diagnosis not present

## 2015-07-16 DIAGNOSIS — E538 Deficiency of other specified B group vitamins: Secondary | ICD-10-CM | POA: Diagnosis not present

## 2015-07-16 DIAGNOSIS — Z23 Encounter for immunization: Secondary | ICD-10-CM

## 2015-07-16 DIAGNOSIS — E785 Hyperlipidemia, unspecified: Secondary | ICD-10-CM | POA: Diagnosis not present

## 2015-07-16 DIAGNOSIS — D509 Iron deficiency anemia, unspecified: Secondary | ICD-10-CM

## 2015-07-16 DIAGNOSIS — E038 Other specified hypothyroidism: Secondary | ICD-10-CM

## 2015-07-16 DIAGNOSIS — D751 Secondary polycythemia: Secondary | ICD-10-CM

## 2015-07-16 DIAGNOSIS — I10 Essential (primary) hypertension: Secondary | ICD-10-CM | POA: Diagnosis not present

## 2015-07-16 DIAGNOSIS — E034 Atrophy of thyroid (acquired): Secondary | ICD-10-CM

## 2015-07-16 DIAGNOSIS — Z91199 Patient's noncompliance with other medical treatment and regimen due to unspecified reason: Secondary | ICD-10-CM

## 2015-07-16 DIAGNOSIS — R7309 Other abnormal glucose: Secondary | ICD-10-CM

## 2015-07-16 DIAGNOSIS — Z9119 Patient's noncompliance with other medical treatment and regimen: Secondary | ICD-10-CM | POA: Diagnosis not present

## 2015-07-16 LAB — LIPID PANEL
CHOL/HDL RATIO: 6
CHOLESTEROL: 260 mg/dL — AB (ref 0–200)
HDL: 46.7 mg/dL (ref >39.00)
NonHDL: 213.73
TRIGLYCERIDES: 246 mg/dL — AB (ref 0.0–149.0)
VLDL: 49.2 mg/dL — ABNORMAL HIGH (ref 0.0–40.0)

## 2015-07-16 LAB — BASIC METABOLIC PANEL
BUN: 15 mg/dL (ref 6–23)
CALCIUM: 9.6 mg/dL (ref 8.4–10.5)
CO2: 27 meq/L (ref 19–32)
Chloride: 104 mEq/L (ref 96–112)
Creatinine, Ser: 0.96 mg/dL (ref 0.40–1.20)
GFR: 62.32 mL/min (ref >60.00)
Glucose, Bld: 118 mg/dL — ABNORMAL HIGH (ref 70–99)
Potassium: 4.2 mEq/L (ref 3.5–5.1)
SODIUM: 140 meq/L (ref 135–145)

## 2015-07-16 LAB — HEPATIC FUNCTION PANEL
ALBUMIN: 4.2 g/dL (ref 3.5–5.2)
ALT: 42 U/L — ABNORMAL HIGH (ref 0–35)
AST: 32 U/L (ref 0–37)
Alkaline Phosphatase: 85 U/L (ref 39–117)
Bilirubin, Direct: 0.2 mg/dL (ref 0.0–0.3)
Total Bilirubin: 1.4 mg/dL — ABNORMAL HIGH (ref 0.2–1.2)
Total Protein: 7.6 g/dL (ref 6.0–8.3)

## 2015-07-16 LAB — HEMOGLOBIN A1C: HEMOGLOBIN A1C: 5.7 % (ref 4.6–6.5)

## 2015-07-16 LAB — VITAMIN B12: VITAMIN B 12: 291 pg/mL (ref 211–911)

## 2015-07-16 LAB — LDL CHOLESTEROL, DIRECT: Direct LDL: 171 mg/dL

## 2015-07-16 MED ORDER — ZOLPIDEM TARTRATE 10 MG PO TABS: 10.0000 mg | ORAL_TABLET | Freq: Every evening | ORAL | Status: DC | PRN

## 2015-07-16 NOTE — Assessment & Plan Note (Signed)
On thyroid

## 2015-07-16 NOTE — Progress Notes (Signed)
Subjective:  Patient ID: Kayla Aguirre, female    DOB: [birth year]  Age: 64 y.o. MRN: [id number]  CC: No chief complaint on file.   HPI Kayla Aguirre presents for HTN, dyslipidemia, insomnia f/u  Outpatient Prescriptions Prior to Visit  Medication Sig Dispense Refill  . amLODipine-valsartan (EXFORGE) 5-320 MG per tablet Take 1 tablet by mouth as needed. 90 tablet 3  . atorvastatin (LIPITOR) 10 MG tablet Take 1 tablet (10 mg total) by mouth daily. 90 tablet 3  . Cyanocobalamin (VITAMIN B-12) 1000 MCG SUBL Place 1 tablet (1,000 mcg total) under the tongue daily. 30 tablet 3  . diclofenac sodium (VOLTAREN) 1 % GEL Apply 2 g topically 4 (four) times daily. 100 g 3  . levothyroxine (LEVOTHROID) 25 MCG tablet Take 1 tablet (25 mcg total) by mouth daily. 30 tablet 3  . loratadine (CLARITIN) 10 MG tablet Take 1 tablet (10 mg total) by mouth daily. 100 tablet 3  . zolpidem (AMBIEN) 10 MG tablet Take 1 tablet (10 mg total) by mouth at bedtime as needed. 30 tablet 3   No facility-administered medications prior to visit.    ROS Review of Systems  Constitutional: Negative for chills, activity change, appetite change, fatigue and unexpected weight change.  HENT: Negative for congestion, mouth sores and sinus pressure.   Eyes: Negative for visual disturbance.  Respiratory: Negative for cough and chest tightness.   Gastrointestinal: Negative for nausea and abdominal pain.  Genitourinary: Negative for frequency, difficulty urinating and vaginal pain.  Musculoskeletal: Negative for back pain and gait problem.  Skin: Negative for pallor and rash.  Neurological: Negative for dizziness, tremors, weakness, numbness and headaches.  Psychiatric/Behavioral: Negative for confusion and sleep disturbance.    Objective:  BP 138/90 mmHg  Pulse 94  Wt 186 lb (84.369 kg)  SpO2 95%  BP Readings from Last 3 Encounters:  07/16/15 138/90  03/16/15 160/101  12/15/14 160/90    Wt Readings from Last 3  Encounters:  07/16/15 186 lb (84.369 kg)  03/16/15 186 lb (84.369 kg)  12/15/14 185 lb (83.915 kg)    Physical Exam  Constitutional: She appears well-developed. No distress.  HENT:  Head: Normocephalic.  Right Ear: External ear normal.  Left Ear: External ear normal.  Nose: Nose normal.  Mouth/Throat: Oropharynx is clear and moist.  Eyes: Conjunctivae are normal. Pupils are equal, round, and reactive to light. Right eye exhibits no discharge. Left eye exhibits no discharge.  Neck: Normal range of motion. Neck supple. No JVD present. No tracheal deviation present. No thyromegaly present.  Cardiovascular: Normal rate, regular rhythm and normal heart sounds.   Pulmonary/Chest: No stridor. No respiratory distress. She has no wheezes.  Abdominal: Soft. Bowel sounds are normal. She exhibits no distension and no mass. There is no tenderness. There is no rebound and no guarding.  Musculoskeletal: She exhibits no edema or tenderness.  Lymphadenopathy:    She has no cervical adenopathy.  Neurological: She displays normal reflexes. No cranial nerve deficit. She exhibits normal muscle tone. Coordination normal.  Skin: No rash noted. No erythema.  Psychiatric: She has a normal mood and affect. Her behavior is normal. Judgment and thought content normal.    Lab Results  Component Value Date   WBC 7.1 12/15/2014   HGB 15.3* 12/15/2014   HCT 43.9 12/15/2014   PLT 207.0 12/15/2014   GLUCOSE 120* 12/15/2014   CHOL 288* 12/15/2014   TRIG 156.0* 12/15/2014   HDL 53.40 12/15/2014   LDLDIRECT 205.2 06/30/2014  LDLCALC 203* 12/15/2014   ALT 55* 06/30/2014   AST 39* 06/30/2014   NA 137 12/15/2014   K 4.4 12/15/2014   CL 105 12/15/2014   CREATININE 0.91 12/15/2014   BUN 17 12/15/2014   CO2 25 12/15/2014   TSH 8.92* 12/15/2014   HGBA1C 5.9 03/25/2014    US Abdomen Complete  07/22/2014  CLINICAL DATA:  Elevated LFTs EXAM: ULTRASOUND ABDOMEN COMPLETE COMPARISON:  09/14/2011 FINDINGS:  Gallbladder: No gallstones, gallbladder wall thickening, or pericholecystic fluid. 3 mm cholesterol polyp. Negative sonographic Murphy's sign. Common bile duct: Diameter: 2 mm Liver: Hyperechoic hepatic parenchyma, reflecting hepatic steatosis. IVC: No abnormality visualized. Pancreas: Visualized portion unremarkable. Spleen: Size and appearance within normal limits. Right Kidney: Length: 11.4 cm.  No mass or hydronephrosis. Left Kidney: Length: 10.6 cm.  No mass or hydronephrosis. Abdominal aorta: No aneurysm visualized. Other findings: None. IMPRESSION: Hepatic steatosis. Electronically Signed   By: Julian Hy M.D.   On: 07/22/2014 08:27    Assessment & Plan:   There are no diagnoses linked to this encounter. I am having Ms. Kopec maintain her loratadine, Vitamin B-12, levothyroxine, diclofenac sodium, atorvastatin, amLODipine-valsartan, and zolpidem.  Meds ordered this encounter  Medications  . zolpidem (AMBIEN) 10 MG tablet    Sig: Take 1 tablet (10 mg total) by mouth at bedtime as needed.    Dispense:  30 tablet    Refill:  3     Follow-up: No Follow-up on file.  Walker Kehr, MD

## 2015-07-16 NOTE — Assessment & Plan Note (Signed)
On B12 

## 2015-07-16 NOTE — Assessment & Plan Note (Signed)
Refractory Discussed 

## 2015-07-16 NOTE — Assessment & Plan Note (Signed)
On Vit D 

## 2015-07-16 NOTE — Assessment & Plan Note (Signed)
Monitoring

## 2015-07-16 NOTE — Assessment & Plan Note (Signed)
Risks associated with treatment noncompliance were discussed. Compliance was encouraged. 

## 2015-07-16 NOTE — Progress Notes (Signed)
Pre visit review using our clinic review tool, if applicable. No additional management support is needed unless otherwise documented below in the visit note. 

## 2015-07-23 ENCOUNTER — Telehealth: Payer: Self-pay

## 2015-07-23 NOTE — Telephone Encounter (Signed)
PA for ambien started via cover my meds.

## 2015-07-27 NOTE — Telephone Encounter (Signed)
PA for Ambien was not needed per fax received from PA department.

## 2015-11-16 ENCOUNTER — Encounter: Payer: Self-pay | Admitting: Internal Medicine

## 2015-11-16 ENCOUNTER — Ambulatory Visit (INDEPENDENT_AMBULATORY_CARE_PROVIDER_SITE_OTHER): Payer: BLUE CROSS/BLUE SHIELD | Admitting: Internal Medicine

## 2015-11-16 ENCOUNTER — Other Ambulatory Visit (INDEPENDENT_AMBULATORY_CARE_PROVIDER_SITE_OTHER): Payer: BLUE CROSS/BLUE SHIELD

## 2015-11-16 VITALS — BP 142/94 | HR 84 | Wt 190.0 lb

## 2015-11-16 DIAGNOSIS — E538 Deficiency of other specified B group vitamins: Secondary | ICD-10-CM

## 2015-11-16 LAB — BASIC METABOLIC PANEL
BUN: 16 mg/dL (ref 6–23)
CALCIUM: 9.4 mg/dL (ref 8.4–10.5)
CO2: 28 meq/L (ref 19–32)
Chloride: 103 mEq/L (ref 96–112)
Creatinine, Ser: 0.83 mg/dL (ref 0.40–1.20)
GFR: 73.63 mL/min (ref >60.00)
Glucose, Bld: 98 mg/dL (ref 70–99)
Potassium: 4.1 mEq/L (ref 3.5–5.1)
SODIUM: 139 meq/L (ref 135–145)

## 2015-11-16 MED ORDER — INDAPAMIDE 2.5 MG PO TABS: 2.5000 mg | ORAL_TABLET | Freq: Every day | ORAL | Status: DC

## 2015-11-16 MED ORDER — ZOLPIDEM TARTRATE 10 MG PO TABS: 10.0000 mg | ORAL_TABLET | Freq: Every evening | ORAL | Status: DC | PRN

## 2015-11-16 MED ORDER — EXFORGE 5-320 MG PO TABS: 1.0000 | ORAL_TABLET | Freq: Every day | ORAL | Status: DC

## 2015-11-16 NOTE — Assessment & Plan Note (Signed)
On B12 

## 2015-11-16 NOTE — Progress Notes (Signed)
Pre visit review using our clinic review tool, if applicable. No additional management support is needed unless otherwise documented below in the visit note. 

## 2015-11-16 NOTE — Progress Notes (Signed)
Subjective:  Patient ID: Kayla Aguirre, female    DOB: [birth year]  Age: 65 y.o. MRN: [id number]  CC: No chief complaint on file.   HPI Kayla Aguirre presents for dyslipidemia, HTN, hypothyroidism f/u  Outpatient Prescriptions Prior to Visit  Medication Sig Dispense Refill  . atorvastatin (LIPITOR) 10 MG tablet Take 1 tablet (10 mg total) by mouth daily. 90 tablet 3  . Cyanocobalamin (VITAMIN B-12) 1000 MCG SUBL Place 1 tablet (1,000 mcg total) under the tongue daily. 30 tablet 3  . diclofenac sodium (VOLTAREN) 1 % GEL Apply 2 g topically 4 (four) times daily. 100 g 3  . EXFORGE 5-320 MG tablet take 1 tablet by mouth once daily 30 tablet 11  . levothyroxine (LEVOTHROID) 25 MCG tablet Take 1 tablet (25 mcg total) by mouth daily. 30 tablet 3  . loratadine (CLARITIN) 10 MG tablet Take 1 tablet (10 mg total) by mouth daily. 100 tablet 3  . zolpidem (AMBIEN) 10 MG tablet Take 1 tablet (10 mg total) by mouth at bedtime as needed. 30 tablet 3   No facility-administered medications prior to visit.    ROS Review of Systems  Constitutional: Negative for chills, activity change, appetite change, fatigue and unexpected weight change.  HENT: Negative for congestion, mouth sores and sinus pressure.   Eyes: Negative for visual disturbance.  Respiratory: Negative for cough and chest tightness.   Gastrointestinal: Negative for nausea and abdominal pain.  Genitourinary: Negative for frequency, difficulty urinating and vaginal pain.  Musculoskeletal: Negative for back pain and gait problem.  Skin: Negative for pallor and rash.  Neurological: Negative for dizziness, tremors, weakness, numbness and headaches.  Psychiatric/Behavioral: Negative for confusion and sleep disturbance.    Objective:  BP 142/94 mmHg  Pulse 84  Wt 190 lb (86.183 kg)  SpO2 96%  BP Readings from Last 3 Encounters:  11/16/15 142/94  07/16/15 138/90  03/16/15 160/101    Wt Readings from Last 3 Encounters:    11/16/15 190 lb (86.183 kg)  07/16/15 186 lb (84.369 kg)  03/16/15 186 lb (84.369 kg)    Physical Exam  Constitutional: She appears well-developed. No distress.  HENT:  Head: Normocephalic.  Right Ear: External ear normal.  Left Ear: External ear normal.  Nose: Nose normal.  Mouth/Throat: Oropharynx is clear and moist.  Eyes: Conjunctivae are normal. Pupils are equal, round, and reactive to light. Right eye exhibits no discharge. Left eye exhibits no discharge.  Neck: Normal range of motion. Neck supple. No JVD present. No tracheal deviation present. No thyromegaly present.  Cardiovascular: Normal rate, regular rhythm and normal heart sounds.   Pulmonary/Chest: No stridor. No respiratory distress. She has no wheezes.  Abdominal: Soft. Bowel sounds are normal. She exhibits no distension and no mass. There is no tenderness. There is no rebound and no guarding.  Musculoskeletal: She exhibits no edema or tenderness.  Lymphadenopathy:    She has no cervical adenopathy.  Neurological: She displays normal reflexes. No cranial nerve deficit. She exhibits normal muscle tone. Coordination normal.  Skin: No rash noted. No erythema.  Psychiatric: She has a normal mood and affect. Her behavior is normal. Judgment and thought content normal.    Lab Results  Component Value Date   WBC 7.1 12/15/2014   HGB 15.3* 12/15/2014   HCT 43.9 12/15/2014   PLT 207.0 12/15/2014   GLUCOSE 118* 07/16/2015   CHOL 260* 07/16/2015   TRIG 246.0* 07/16/2015   HDL 46.70 07/16/2015   LDLDIRECT 171.0 07/16/2015   LDLCALC  203* 12/15/2014   ALT 42* 07/16/2015   AST 32 07/16/2015   NA 140 07/16/2015   K 4.2 07/16/2015   CL 104 07/16/2015   CREATININE 0.96 07/16/2015   BUN 15 07/16/2015   CO2 27 07/16/2015   TSH 8.92* 12/15/2014   HGBA1C 5.7 07/16/2015    US Abdomen Complete  07/22/2014  CLINICAL DATA:  Elevated LFTs EXAM: ULTRASOUND ABDOMEN COMPLETE COMPARISON:  09/14/2011 FINDINGS: Gallbladder: No  gallstones, gallbladder wall thickening, or pericholecystic fluid. 3 mm cholesterol polyp. Negative sonographic Murphy's sign. Common bile duct: Diameter: 2 mm Liver: Hyperechoic hepatic parenchyma, reflecting hepatic steatosis. IVC: No abnormality visualized. Pancreas: Visualized portion unremarkable. Spleen: Size and appearance within normal limits. Right Kidney: Length: 11.4 cm.  No mass or hydronephrosis. Left Kidney: Length: 10.6 cm.  No mass or hydronephrosis. Abdominal aorta: No aneurysm visualized. Other findings: None. IMPRESSION: Hepatic steatosis. Electronically Signed   By: Julian Hy M.D.   On: 07/22/2014 08:27    Assessment & Plan:   Diagnoses and all orders for this visit:  B12 deficiency -     Basic metabolic panel; Future  Other orders -     EXFORGE 5-320 MG tablet; Take 1 tablet by mouth daily. -     zolpidem (AMBIEN) 10 MG tablet; Take 1 tablet (10 mg total) by mouth at bedtime as needed. -     indapamide (LOZOL) 2.5 MG tablet; Take 1 tablet (2.5 mg total) by mouth daily.  I have changed Ms. Emiliah Attard. I am also having her start on indapamide. Additionally, I am having her maintain her loratadine, Vitamin B-12, levothyroxine, diclofenac sodium, atorvastatin, and zolpidem.  Meds ordered this encounter  Medications  . EXFORGE 5-320 MG tablet    Sig: Take 1 tablet by mouth daily.    Dispense:  30 tablet    Refill:  11    Please dispense Brand name only.  Marland Kitchen zolpidem (AMBIEN) 10 MG tablet    Sig: Take 1 tablet (10 mg total) by mouth at bedtime as needed.    Dispense:  30 tablet    Refill:  3  . indapamide (LOZOL) 2.5 MG tablet    Sig: Take 1 tablet (2.5 mg total) by mouth daily.    Dispense:  30 tablet    Refill:  11     Follow-up: Return in about 4 months (around 03/15/2016) for a follow-up visit.  Walker Kehr, MD

## 2015-11-17 ENCOUNTER — Telehealth: Payer: Self-pay

## 2015-11-17 NOTE — Telephone Encounter (Signed)
PA initiated via CoverMyMeds Key [id number]

## 2015-11-17 NOTE — Telephone Encounter (Signed)
PA approved thru 09/18/2038 - pharmacy advised

## 2016-03-16 ENCOUNTER — Ambulatory Visit (INDEPENDENT_AMBULATORY_CARE_PROVIDER_SITE_OTHER): Payer: BLUE CROSS/BLUE SHIELD | Admitting: Internal Medicine

## 2016-03-16 ENCOUNTER — Encounter: Payer: Self-pay | Admitting: Internal Medicine

## 2016-03-16 VITALS — BP 140/88 | HR 84 | Wt 191.0 lb

## 2016-03-16 DIAGNOSIS — E038 Other specified hypothyroidism: Secondary | ICD-10-CM | POA: Diagnosis not present

## 2016-03-16 DIAGNOSIS — Z638 Other specified problems related to primary support group: Secondary | ICD-10-CM

## 2016-03-16 DIAGNOSIS — E559 Vitamin D deficiency, unspecified: Secondary | ICD-10-CM

## 2016-03-16 DIAGNOSIS — F439 Reaction to severe stress, unspecified: Secondary | ICD-10-CM

## 2016-03-16 DIAGNOSIS — D751 Secondary polycythemia: Secondary | ICD-10-CM

## 2016-03-16 DIAGNOSIS — I10 Essential (primary) hypertension: Secondary | ICD-10-CM | POA: Diagnosis not present

## 2016-03-16 DIAGNOSIS — R945 Abnormal results of liver function studies: Secondary | ICD-10-CM

## 2016-03-16 DIAGNOSIS — R7989 Other specified abnormal findings of blood chemistry: Secondary | ICD-10-CM

## 2016-03-16 DIAGNOSIS — E538 Deficiency of other specified B group vitamins: Secondary | ICD-10-CM | POA: Diagnosis not present

## 2016-03-16 DIAGNOSIS — E034 Atrophy of thyroid (acquired): Secondary | ICD-10-CM

## 2016-03-16 DIAGNOSIS — E785 Hyperlipidemia, unspecified: Secondary | ICD-10-CM

## 2016-03-16 NOTE — Assessment & Plan Note (Signed)
Labs

## 2016-03-16 NOTE — Assessment & Plan Note (Signed)
On Vit B12 

## 2016-03-16 NOTE — Assessment & Plan Note (Signed)
CBC

## 2016-03-16 NOTE — Progress Notes (Signed)
Pre visit review using our clinic review tool, if applicable. No additional management support is needed unless otherwise documented below in the visit note. 

## 2016-03-16 NOTE — Assessment & Plan Note (Signed)
Pt states BP is low at times - pt doesn't take her meds as prescribed - every other day 1/2 tab Exforge

## 2016-03-16 NOTE — Progress Notes (Signed)
Subjective:  Patient ID: Kayla Aguirre, female    DOB: July 14, 1951  Age: 65 y.o. MRN: [id number]  CC: No chief complaint on file.   HPI Bay Kaszynski presents for HTN, dyslipidemia, hypothyroidism. C/o stress son is in the process of separation  Outpatient Prescriptions Prior to Visit  Medication Sig Dispense Refill  . atorvastatin (LIPITOR) 10 MG tablet Take 1 tablet (10 mg total) by mouth daily. 90 tablet 3  . Cyanocobalamin (VITAMIN B-12) 1000 MCG SUBL Place 1 tablet (1,000 mcg total) under the tongue daily. 30 tablet 3  . diclofenac sodium (VOLTAREN) 1 % GEL Apply 2 g topically 4 (four) times daily. 100 g 3  . EXFORGE 5-320 MG tablet Take 1 tablet by mouth daily. 30 tablet 11  . indapamide (LOZOL) 2.5 MG tablet Take 1 tablet (2.5 mg total) by mouth daily. 30 tablet 11  . levothyroxine (LEVOTHROID) 25 MCG tablet Take 1 tablet (25 mcg total) by mouth daily. 30 tablet 3  . loratadine (CLARITIN) 10 MG tablet Take 1 tablet (10 mg total) by mouth daily. 100 tablet 3  . zolpidem (AMBIEN) 10 MG tablet Take 1 tablet (10 mg total) by mouth at bedtime as needed. 30 tablet 3   No facility-administered medications prior to visit.    ROS Review of Systems  Constitutional: Negative for chills, activity change, appetite change, fatigue and unexpected weight change.  HENT: Negative for congestion, mouth sores and sinus pressure.   Eyes: Negative for visual disturbance.  Respiratory: Negative for cough and chest tightness.   Gastrointestinal: Negative for nausea and abdominal pain.  Genitourinary: Negative for frequency, difficulty urinating and vaginal pain.  Musculoskeletal: Negative for back pain and gait problem.  Skin: Negative for pallor and rash.  Neurological: Negative for dizziness, tremors, weakness, numbness and headaches.  Psychiatric/Behavioral: Negative for confusion and sleep disturbance. The patient is nervous/anxious.     Objective:  BP 140/88 mmHg  Pulse 84  Wt 191 lb  (86.637 kg)  SpO2 97%  BP Readings from Last 3 Encounters:  03/16/16 140/88  11/16/15 142/94  07/16/15 138/90    Wt Readings from Last 3 Encounters:  03/16/16 191 lb (86.637 kg)  11/16/15 190 lb (86.183 kg)  07/16/15 186 lb (84.369 kg)    Physical Exam  Constitutional: She appears well-developed. No distress.  HENT:  Head: Normocephalic.  Right Ear: External ear normal.  Left Ear: External ear normal.  Nose: Nose normal.  Mouth/Throat: Oropharynx is clear and moist.  Eyes: Conjunctivae are normal. Pupils are equal, round, and reactive to light. Right eye exhibits no discharge. Left eye exhibits no discharge.  Neck: Normal range of motion. Neck supple. No JVD present. No tracheal deviation present. No thyromegaly present.  Cardiovascular: Normal rate, regular rhythm and normal heart sounds.   Pulmonary/Chest: No stridor. No respiratory distress. She has no wheezes.  Abdominal: Soft. Bowel sounds are normal. She exhibits no distension and no mass. There is no tenderness. There is no rebound and no guarding.  Musculoskeletal: She exhibits no edema or tenderness.  Lymphadenopathy:    She has no cervical adenopathy.  Neurological: She displays normal reflexes. No cranial nerve deficit. She exhibits normal muscle tone. Coordination normal.  Skin: No rash noted. No erythema.  Psychiatric: She has a normal mood and affect. Her behavior is normal. Judgment and thought content normal.    Lab Results  Component Value Date   WBC 7.1 12/15/2014   HGB 15.3* 12/15/2014   HCT 43.9 12/15/2014   PLT  207.0 12/15/2014   GLUCOSE 98 11/16/2015   CHOL 260* 07/16/2015   TRIG 246.0* 07/16/2015   HDL 46.70 07/16/2015   LDLDIRECT 171.0 07/16/2015   LDLCALC 203* 12/15/2014   ALT 42* 07/16/2015   AST 32 07/16/2015   NA 139 11/16/2015   K 4.1 11/16/2015   CL 103 11/16/2015   CREATININE 0.83 11/16/2015   BUN 16 11/16/2015   CO2 28 11/16/2015   TSH 8.92* 12/15/2014   HGBA1C 5.7 07/16/2015     US Abdomen Complete  07/22/2014  CLINICAL DATA:  Elevated LFTs EXAM: ULTRASOUND ABDOMEN COMPLETE COMPARISON:  09/14/2011 FINDINGS: Gallbladder: No gallstones, gallbladder wall thickening, or pericholecystic fluid. 3 mm cholesterol polyp. Negative sonographic Murphy's sign. Common bile duct: Diameter: 2 mm Liver: Hyperechoic hepatic parenchyma, reflecting hepatic steatosis. IVC: No abnormality visualized. Pancreas: Visualized portion unremarkable. Spleen: Size and appearance within normal limits. Right Kidney: Length: 11.4 cm.  No mass or hydronephrosis. Left Kidney: Length: 10.6 cm.  No mass or hydronephrosis. Abdominal aorta: No aneurysm visualized. Other findings: None. IMPRESSION: Hepatic steatosis. Electronically Signed   By: Julian Hy M.D.   On: 07/22/2014 08:27    Assessment & Plan:   Diagnoses and all orders for this visit:  Essential hypertension   I am having Ms. Trabue maintain her loratadine, Vitamin B-12, levothyroxine, diclofenac sodium, atorvastatin, EXFORGE, zolpidem, and indapamide.  No orders of the defined types were placed in this encounter.     Follow-up: No Follow-up on file.  Walker Kehr, MD

## 2016-03-16 NOTE — Assessment & Plan Note (Signed)
Not on Rx Labs

## 2016-03-16 NOTE — Assessment & Plan Note (Signed)
On Vit D 

## 2016-03-16 NOTE — Assessment & Plan Note (Signed)
son is in the process of separation - discussed

## 2016-03-17 ENCOUNTER — Other Ambulatory Visit (INDEPENDENT_AMBULATORY_CARE_PROVIDER_SITE_OTHER): Payer: BLUE CROSS/BLUE SHIELD

## 2016-03-17 DIAGNOSIS — I10 Essential (primary) hypertension: Secondary | ICD-10-CM

## 2016-03-17 DIAGNOSIS — E034 Atrophy of thyroid (acquired): Secondary | ICD-10-CM

## 2016-03-17 DIAGNOSIS — E038 Other specified hypothyroidism: Secondary | ICD-10-CM | POA: Diagnosis not present

## 2016-03-17 DIAGNOSIS — E785 Hyperlipidemia, unspecified: Secondary | ICD-10-CM | POA: Diagnosis not present

## 2016-03-17 DIAGNOSIS — E538 Deficiency of other specified B group vitamins: Secondary | ICD-10-CM

## 2016-03-17 DIAGNOSIS — R7989 Other specified abnormal findings of blood chemistry: Secondary | ICD-10-CM

## 2016-03-17 DIAGNOSIS — R945 Abnormal results of liver function studies: Secondary | ICD-10-CM

## 2016-03-17 DIAGNOSIS — E559 Vitamin D deficiency, unspecified: Secondary | ICD-10-CM | POA: Diagnosis not present

## 2016-03-17 LAB — BASIC METABOLIC PANEL
BUN: 13 mg/dL (ref 6–23)
CHLORIDE: 105 meq/L (ref 96–112)
CO2: 30 meq/L (ref 19–32)
Calcium: 9.7 mg/dL (ref 8.4–10.5)
Creatinine, Ser: 0.94 mg/dL (ref 0.40–1.20)
GFR: 63.71 mL/min (ref >60.00)
Glucose, Bld: 120 mg/dL — ABNORMAL HIGH (ref 70–99)
POTASSIUM: 4.7 meq/L (ref 3.5–5.1)
SODIUM: 139 meq/L (ref 135–145)

## 2016-03-17 LAB — HEPATIC FUNCTION PANEL
ALBUMIN: 4.1 g/dL (ref 3.5–5.2)
ALT: 59 U/L — AB (ref 0–35)
AST: 33 U/L (ref 0–37)
Alkaline Phosphatase: 77 U/L (ref 39–117)
BILIRUBIN DIRECT: 0.1 mg/dL (ref 0.0–0.3)
TOTAL PROTEIN: 7.3 g/dL (ref 6.0–8.3)
Total Bilirubin: 1 mg/dL (ref 0.2–1.2)

## 2016-03-17 LAB — LIPID PANEL
Cholesterol: 256 mg/dL — ABNORMAL HIGH (ref 0–200)
HDL: 46.8 mg/dL (ref >39.00)
NONHDL: 209.47
TRIGLYCERIDES: 270 mg/dL — AB (ref 0.0–149.0)
Total CHOL/HDL Ratio: 5
VLDL: 54 mg/dL — ABNORMAL HIGH (ref 0.0–40.0)

## 2016-03-17 LAB — CBC WITH DIFFERENTIAL/PLATELET
BASOS PCT: 0.7 % (ref 0.0–3.0)
Basophils Absolute: 0.1 10*3/uL (ref 0.0–0.1)
EOS PCT: 0.9 % (ref 0.0–5.0)
Eosinophils Absolute: 0.1 10*3/uL (ref 0.0–0.7)
HEMATOCRIT: 45.3 % (ref 36.0–46.0)
HEMOGLOBIN: 15.5 g/dL — AB (ref 12.0–15.0)
LYMPHS PCT: 39.2 % (ref 12.0–46.0)
Lymphs Abs: 3 10*3/uL (ref 0.7–4.0)
MCHC: 34.3 g/dL (ref 30.0–36.0)
MCV: 82.5 fl (ref 78.0–100.0)
MONOS PCT: 7.3 % (ref 3.0–12.0)
Monocytes Absolute: 0.6 10*3/uL (ref 0.1–1.0)
NEUTROS ABS: 4 10*3/uL (ref 1.4–7.7)
Neutrophils Relative %: 51.9 % (ref 43.0–77.0)
PLATELETS: 255 10*3/uL (ref 150.0–400.0)
RBC: 5.5 Mil/uL — ABNORMAL HIGH (ref 3.87–5.11)
RDW: 13.5 % (ref 11.5–15.5)
WBC: 7.7 10*3/uL (ref 4.0–10.5)

## 2016-03-17 LAB — TSH: TSH: 6.26 u[IU]/mL — AB (ref 0.35–4.50)

## 2016-03-17 LAB — LDL CHOLESTEROL, DIRECT: Direct LDL: 171 mg/dL

## 2016-03-18 LAB — HEPATITIS C ANTIBODY: HCV AB: NEGATIVE

## 2016-05-18 ENCOUNTER — Telehealth: Payer: Self-pay | Admitting: Internal Medicine

## 2016-05-18 NOTE — Telephone Encounter (Signed)
Patient walked in to re [facility] results from 03/15/2016 labs.  pleease review and mail results to patient

## 2016-05-19 NOTE — Telephone Encounter (Signed)
CMET, A1c, Lipids, CBC, TSH Thx

## 2016-05-19 NOTE — Telephone Encounter (Signed)
Please review and advise on her 03/17/16 lab results. She never heard back on those results. Please advise.

## 2016-05-21 NOTE — Telephone Encounter (Signed)
I mailed her the results with my comments in [place]. Tere was elevated TSH, lipids and glucose. She needs to improve her treatment noncompliance with meds Thx   Please, mail the labs to the patient.

## 2016-05-24 NOTE — Telephone Encounter (Signed)
I called pt- She states she did receive note on labs from PCP. she specifically wanted to know her Hep C results. I advised her of negative result. She thanked me for calling. I advised her to keep ROV with PCP as scheduled or call us if needed sooner.

## 2016-05-31 ENCOUNTER — Encounter: Payer: Self-pay | Admitting: Internal Medicine

## 2016-05-31 ENCOUNTER — Ambulatory Visit (INDEPENDENT_AMBULATORY_CARE_PROVIDER_SITE_OTHER): Payer: BLUE CROSS/BLUE SHIELD | Admitting: Internal Medicine

## 2016-05-31 DIAGNOSIS — Z9119 Patient's noncompliance with other medical treatment and regimen: Secondary | ICD-10-CM

## 2016-05-31 DIAGNOSIS — E538 Deficiency of other specified B group vitamins: Secondary | ICD-10-CM

## 2016-05-31 DIAGNOSIS — Z91199 Patient's noncompliance with other medical treatment and regimen due to unspecified reason: Secondary | ICD-10-CM

## 2016-05-31 DIAGNOSIS — E559 Vitamin D deficiency, unspecified: Secondary | ICD-10-CM

## 2016-05-31 DIAGNOSIS — E785 Hyperlipidemia, unspecified: Secondary | ICD-10-CM | POA: Diagnosis not present

## 2016-05-31 DIAGNOSIS — I10 Essential (primary) hypertension: Secondary | ICD-10-CM | POA: Diagnosis not present

## 2016-05-31 DIAGNOSIS — D751 Secondary polycythemia: Secondary | ICD-10-CM

## 2016-05-31 MED ORDER — CEFDINIR 300 MG PO CAPS: 300.0000 mg | ORAL_CAPSULE | Freq: Two times a day (BID) | ORAL | 0 refills | Status: DC

## 2016-05-31 MED ORDER — EXFORGE 5-320 MG PO TABS: 1.0000 | ORAL_TABLET | Freq: Every day | ORAL | 3 refills | Status: DC

## 2016-05-31 MED ORDER — LEVOTHYROXINE SODIUM 25 MCG PO TABS: 25.0000 ug | ORAL_TABLET | Freq: Every day | ORAL | 3 refills | Status: DC

## 2016-05-31 MED ORDER — INDAPAMIDE 2.5 MG PO TABS: 2.5000 mg | ORAL_TABLET | Freq: Every day | ORAL | 3 refills | Status: DC

## 2016-05-31 NOTE — Assessment & Plan Note (Signed)
Declined statins. 

## 2016-05-31 NOTE — Assessment & Plan Note (Signed)
Baby ASA Denies OSA

## 2016-05-31 NOTE — Progress Notes (Signed)
Subjective:  Patient ID: Kayla Aguirre, female    DOB: 14-Mar-1951  Age: 65 y.o. MRN: [id number]  CC: Follow-up (pt complains of sore throat )   HPI Kayla Aguirre presents for HTN, dyslipidemia, hypothyroidism f/u. They are moving to [place] for her husband's work for 12 months  Outpatient Medications Prior to Visit  Medication Sig Dispense Refill  . atorvastatin (LIPITOR) 10 MG tablet Take 1 tablet (10 mg total) by mouth daily. 90 tablet 3  . Cyanocobalamin (VITAMIN B-12) 1000 MCG SUBL Place 1 tablet (1,000 mcg total) under the tongue daily. 30 tablet 3  . diclofenac sodium (VOLTAREN) 1 % GEL Apply 2 g topically 4 (four) times daily. 100 g 3  . EXFORGE 5-320 MG tablet Take 1 tablet by mouth daily. 30 tablet 11  . indapamide (LOZOL) 2.5 MG tablet Take 1 tablet (2.5 mg total) by mouth daily. 30 tablet 11  . levothyroxine (LEVOTHROID) 25 MCG tablet Take 1 tablet (25 mcg total) by mouth daily. 30 tablet 3  . loratadine (CLARITIN) 10 MG tablet Take 1 tablet (10 mg total) by mouth daily. 100 tablet 3  . zolpidem (AMBIEN) 10 MG tablet Take 1 tablet (10 mg total) by mouth at bedtime as needed. 30 tablet 3   No facility-administered medications prior to visit.     ROS Review of Systems  Constitutional: Negative for activity change, appetite change, chills, fatigue and unexpected weight change.  HENT: Negative for congestion, mouth sores and sinus pressure.   Eyes: Negative for visual disturbance.  Respiratory: Negative for cough and chest tightness.   Gastrointestinal: Negative for abdominal pain and nausea.  Genitourinary: Negative for difficulty urinating, frequency and vaginal pain.  Musculoskeletal: Negative for back pain and gait problem.  Skin: Negative for pallor and rash.  Neurological: Negative for dizziness, tremors, weakness, numbness and headaches.  Psychiatric/Behavioral: Negative for confusion, sleep disturbance and suicidal ideas.    Objective:  BP (!) 140/100    Pulse 93   Temp 98.3 F (36.8 C)   Wt 191 lb 0.6 oz (86.7 kg)   SpO2 95%   BMI 31.79 kg/m   BP Readings from Last 3 Encounters:  05/31/16 (!) 140/100  03/16/16 140/88  11/16/15 (!) 142/94    Wt Readings from Last 3 Encounters:  05/31/16 191 lb 0.6 oz (86.7 kg)  03/16/16 191 lb (86.6 kg)  11/16/15 190 lb (86.2 kg)    Physical Exam  Constitutional: She appears well-developed. No distress.  HENT:  Head: Normocephalic.  Right Ear: External ear normal.  Left Ear: External ear normal.  Nose: Nose normal.  Mouth/Throat: Oropharynx is clear and moist.  Eyes: Conjunctivae are normal. Pupils are equal, round, and reactive to light. Right eye exhibits no discharge. Left eye exhibits no discharge.  Neck: Normal range of motion. Neck supple. No JVD present. No tracheal deviation present. No thyromegaly present.  Cardiovascular: Normal rate, regular rhythm and normal heart sounds.   Pulmonary/Chest: No stridor. No respiratory distress. She has no wheezes.  Abdominal: Soft. Bowel sounds are normal. She exhibits no distension and no mass. There is no tenderness. There is no rebound and no guarding.  Musculoskeletal: She exhibits no edema or tenderness.  Lymphadenopathy:    She has no cervical adenopathy.  Neurological: She displays normal reflexes. No cranial nerve deficit. She exhibits normal muscle tone. Coordination normal.  Skin: No rash noted. No erythema.  Psychiatric: She has a normal mood and affect. Her behavior is normal. Judgment and thought content normal.  Lab Results  Component Value Date   WBC 7.7 03/17/2016   HGB 15.5 (H) 03/17/2016   HCT 45.3 03/17/2016   PLT 255.0 03/17/2016   GLUCOSE 120 (H) 03/17/2016   CHOL 256 (H) 03/17/2016   TRIG 270.0 (H) 03/17/2016   HDL 46.80 03/17/2016   LDLDIRECT 171.0 03/17/2016   LDLCALC 203 (H) 12/15/2014   ALT 59 (H) 03/17/2016   AST 33 03/17/2016   NA 139 03/17/2016   K 4.7 03/17/2016   CL 105 03/17/2016   CREATININE 0.94  03/17/2016   BUN 13 03/17/2016   CO2 30 03/17/2016   TSH 6.26 (H) 03/17/2016   HGBA1C 5.7 07/16/2015    US Abdomen Complete  Result Date: 07/22/2014 CLINICAL DATA:  Elevated LFTs EXAM: ULTRASOUND ABDOMEN COMPLETE COMPARISON:  09/14/2011 FINDINGS: Gallbladder: No gallstones, gallbladder wall thickening, or pericholecystic fluid. 3 mm cholesterol polyp. Negative sonographic Murphy's sign. Common bile duct: Diameter: 2 mm Liver: Hyperechoic hepatic parenchyma, reflecting hepatic steatosis. IVC: No abnormality visualized. Pancreas: Visualized portion unremarkable. Spleen: Size and appearance within normal limits. Right Kidney: Length: 11.4 cm.  No mass or hydronephrosis. Left Kidney: Length: 10.6 cm.  No mass or hydronephrosis. Abdominal aorta: No aneurysm visualized. Other findings: None. IMPRESSION: Hepatic steatosis. Electronically Signed   By: Julian Hy M.D.   On: 07/22/2014 08:27    Assessment & Plan:   There are no diagnoses linked to this encounter. I am having Ms. Easterday maintain her loratadine, Vitamin B-12, levothyroxine, diclofenac sodium, atorvastatin, EXFORGE, zolpidem, and indapamide.  No orders of the defined types were placed in this encounter.    Follow-up: No Follow-up on file.  Walker Kehr, MD

## 2016-05-31 NOTE — Assessment & Plan Note (Signed)
Risks associated with treatment noncompliance were discussed. Compliance was encouraged. 

## 2016-05-31 NOTE — Assessment & Plan Note (Signed)
On Vit D 

## 2016-05-31 NOTE — Assessment & Plan Note (Addendum)
Discussed Needs to improve compliance with all meds

## 2016-05-31 NOTE — Assessment & Plan Note (Signed)
Chronic  Risks associated with treatment noncompliance were discussed. Compliance was encouraged. Pt is taking meds qod... Pt has fears of taking meds due to potential harm from the "chemicals"  Pt states BP is low at times - pt doesn't take her meds as prescribed - every other day 1/2 tab Exforge

## 2016-06-02 ENCOUNTER — Telehealth: Payer: Self-pay | Admitting: *Deleted

## 2016-06-02 NOTE — Telephone Encounter (Signed)
Rec fax from [facility] name Exforge is too expensive. Pt is requesting generic # 90.  Please advise.

## 2016-06-02 NOTE — Telephone Encounter (Signed)
OK generic Thx

## 2016-06-03 MED ORDER — AMLODIPINE BESYLATE-VALSARTAN 5-320 MG PO TABS: 1.0000 | ORAL_TABLET | Freq: Every day | ORAL | 3 refills | Status: DC

## 2016-06-03 NOTE — Telephone Encounter (Signed)
[facility] [place]...Johny Chess

## 2016-07-18 ENCOUNTER — Ambulatory Visit: Payer: BLUE CROSS/BLUE SHIELD | Admitting: Internal Medicine

## 2017-10-24 ENCOUNTER — Ambulatory Visit (INDEPENDENT_AMBULATORY_CARE_PROVIDER_SITE_OTHER): Payer: Medicare HMO | Admitting: Internal Medicine

## 2017-10-24 ENCOUNTER — Other Ambulatory Visit (INDEPENDENT_AMBULATORY_CARE_PROVIDER_SITE_OTHER): Payer: Medicare HMO

## 2017-10-24 ENCOUNTER — Encounter: Payer: Self-pay | Admitting: Internal Medicine

## 2017-10-24 VITALS — BP 118/76 | HR 91 | Temp 98.7°F | Ht 65.0 in | Wt 186.0 lb

## 2017-10-24 DIAGNOSIS — Z23 Encounter for immunization: Secondary | ICD-10-CM | POA: Diagnosis not present

## 2017-10-24 DIAGNOSIS — E538 Deficiency of other specified B group vitamins: Secondary | ICD-10-CM

## 2017-10-24 DIAGNOSIS — E559 Vitamin D deficiency, unspecified: Secondary | ICD-10-CM | POA: Diagnosis not present

## 2017-10-24 DIAGNOSIS — J069 Acute upper respiratory infection, unspecified: Secondary | ICD-10-CM

## 2017-10-24 DIAGNOSIS — E034 Atrophy of thyroid (acquired): Secondary | ICD-10-CM

## 2017-10-24 DIAGNOSIS — I10 Essential (primary) hypertension: Secondary | ICD-10-CM

## 2017-10-24 LAB — HEPATIC FUNCTION PANEL
ALT: 39 U/L — AB (ref 0–35)
AST: 27 U/L (ref 0–37)
Albumin: 4.3 g/dL (ref 3.5–5.2)
Alkaline Phosphatase: 85 U/L (ref 39–117)
BILIRUBIN TOTAL: 1.1 mg/dL (ref 0.2–1.2)
Bilirubin, Direct: 0.1 mg/dL (ref 0.0–0.3)
Total Protein: 7.8 g/dL (ref 6.0–8.3)

## 2017-10-24 LAB — TSH: TSH: 3.99 u[IU]/mL (ref 0.35–4.50)

## 2017-10-24 LAB — URINALYSIS
Bilirubin Urine: NEGATIVE
HGB URINE DIPSTICK: NEGATIVE
Ketones, ur: NEGATIVE
LEUKOCYTES UA: NEGATIVE
NITRITE: NEGATIVE
Specific Gravity, Urine: >=1.03 — AB (ref 1.000–1.030)
Total Protein, Urine: NEGATIVE
URINE GLUCOSE: NEGATIVE
UROBILINOGEN UA: 0.2 (ref 0.0–1.0)
pH: 5.5 (ref 5.0–8.0)

## 2017-10-24 LAB — CBC WITH DIFFERENTIAL/PLATELET
BASOS ABS: 0.1 10*3/uL (ref 0.0–0.1)
Basophils Relative: 0.7 % (ref 0.0–3.0)
EOS ABS: 0 10*3/uL (ref 0.0–0.7)
Eosinophils Relative: 0.5 % (ref 0.0–5.0)
HCT: 43.5 % (ref 36.0–46.0)
HEMOGLOBIN: 15.1 g/dL — AB (ref 12.0–15.0)
Lymphocytes Relative: 29.5 % (ref 12.0–46.0)
Lymphs Abs: 2.9 10*3/uL (ref 0.7–4.0)
MCHC: 34.6 g/dL (ref 30.0–36.0)
MCV: 83.6 fl (ref 78.0–100.0)
MONOS PCT: 7.5 % (ref 3.0–12.0)
Monocytes Absolute: 0.7 10*3/uL (ref 0.1–1.0)
Neutro Abs: 6.1 10*3/uL (ref 1.4–7.7)
Neutrophils Relative %: 61.8 % (ref 43.0–77.0)
Platelets: 258 10*3/uL (ref 150.0–400.0)
RBC: 5.21 Mil/uL — ABNORMAL HIGH (ref 3.87–5.11)
RDW: 13.8 % (ref 11.5–15.5)
WBC: 9.8 10*3/uL (ref 4.0–10.5)

## 2017-10-24 LAB — BASIC METABOLIC PANEL
BUN: 16 mg/dL (ref 6–23)
CALCIUM: 9.4 mg/dL (ref 8.4–10.5)
CHLORIDE: 102 meq/L (ref 96–112)
CO2: 30 meq/L (ref 19–32)
CREATININE: 0.88 mg/dL (ref 0.40–1.20)
GFR: 68.41 mL/min (ref >60.00)
Glucose, Bld: 102 mg/dL — ABNORMAL HIGH (ref 70–99)
Potassium: 3.9 mEq/L (ref 3.5–5.1)
Sodium: 140 mEq/L (ref 135–145)

## 2017-10-24 LAB — LIPID PANEL
CHOL/HDL RATIO: 5
CHOLESTEROL: 260 mg/dL — AB (ref 0–200)
HDL: 54.7 mg/dL (ref >39.00)
LDL CALC: 172 mg/dL — AB (ref 0–99)
NonHDL: 204.88
Triglycerides: 164 mg/dL — ABNORMAL HIGH (ref 0.0–149.0)
VLDL: 32.8 mg/dL (ref 0.0–40.0)

## 2017-10-24 LAB — VITAMIN D 25 HYDROXY (VIT D DEFICIENCY, FRACTURES): VITD: 12.96 ng/mL — ABNORMAL LOW (ref 30.00–100.00)

## 2017-10-24 LAB — VITAMIN B12: VITAMIN B 12: 302 pg/mL (ref 211–911)

## 2017-10-24 MED ORDER — CEFDINIR 300 MG PO CAPS: 300.0000 mg | ORAL_CAPSULE | Freq: Two times a day (BID) | ORAL | 0 refills | Status: DC

## 2017-10-24 MED ORDER — AMLODIPINE BESYLATE-VALSARTAN 10-160 MG PO TABS: 1.0000 | ORAL_TABLET | Freq: Every day | ORAL | 3 refills | Status: DC

## 2017-10-24 MED ORDER — HYDROCHLOROTHIAZIDE 25 MG PO TABS: 25.0000 mg | ORAL_TABLET | Freq: Every day | ORAL | 3 refills | Status: DC

## 2017-10-24 NOTE — Assessment & Plan Note (Signed)
On B12 

## 2017-10-24 NOTE — Assessment & Plan Note (Signed)
BP Readings from Last 3 Encounters:  10/24/17 118/76  05/31/16 (!) 140/100  03/16/16 140/88

## 2017-10-24 NOTE — Assessment & Plan Note (Signed)
Labs

## 2017-10-24 NOTE — Progress Notes (Signed)
Subjective:  Patient ID: Kayla Aguirre, female    DOB: 1950-12-24  Age: 67 y.o. MRN: [id number]  CC: No chief complaint on file.   HPI Kayla Aguirre presents for HTN, B12 def, hypothyroidism f/u C/o URI  Outpatient Medications Prior to Visit  Medication Sig Dispense Refill  . amLODipine-valsartan (EXFORGE) 5-320 MG tablet Take 1 tablet by mouth daily. 90 tablet 3  . atorvastatin (LIPITOR) 10 MG tablet Take 1 tablet (10 mg total) by mouth daily. 90 tablet 3  . Cyanocobalamin (VITAMIN B-12) 1000 MCG SUBL Place 1 tablet (1,000 mcg total) under the tongue daily. 30 tablet 3  . diclofenac sodium (VOLTAREN) 1 % GEL Apply 2 g topically 4 (four) times daily. 100 g 3  . indapamide (LOZOL) 2.5 MG tablet Take 1 tablet (2.5 mg total) by mouth daily. 90 tablet 3  . levothyroxine (LEVOTHROID) 25 MCG tablet Take 1 tablet (25 mcg total) by mouth daily. 90 tablet 3  . loratadine (CLARITIN) 10 MG tablet Take 1 tablet (10 mg total) by mouth daily. 100 tablet 3  . zolpidem (AMBIEN) 10 MG tablet Take 1 tablet (10 mg total) by mouth at bedtime as needed. 30 tablet 3  . cefdinir (OMNICEF) 300 MG capsule Take 1 capsule (300 mg total) by mouth 2 (two) times daily. 20 capsule 0   No facility-administered medications prior to visit.     ROS Review of Systems  Constitutional: Negative for activity change, appetite change, chills, fatigue and unexpected weight change.  HENT: Negative for congestion, mouth sores and sinus pressure.   Eyes: Negative for visual disturbance.  Respiratory: Negative for cough and chest tightness.   Gastrointestinal: Negative for abdominal pain and nausea.  Genitourinary: Negative for difficulty urinating, frequency and vaginal pain.  Musculoskeletal: Negative for back pain and gait problem.  Skin: Negative for pallor and rash.  Neurological: Negative for dizziness, tremors, weakness, numbness and headaches.  Psychiatric/Behavioral: Negative for confusion and sleep  disturbance.    Objective:  BP 118/76 (BP Location: Left Arm, Patient Position: Sitting, Cuff Size: Large)   Pulse 91   Temp 98.7 F (37.1 C) (Oral)   Ht 5\' 5"  (1.651 m)   Wt 186 lb (84.4 kg)   SpO2 98%   BMI 30.95 kg/m   BP Readings from Last 3 Encounters:  10/24/17 118/76  05/31/16 (!) 140/100  03/16/16 140/88    Wt Readings from Last 3 Encounters:  10/24/17 186 lb (84.4 kg)  05/31/16 191 lb 0.6 oz (86.7 kg)  03/16/16 191 lb (86.6 kg)    Physical Exam  Constitutional: She appears well-developed. No distress.  HENT:  Head: Normocephalic.  Right Ear: External ear normal.  Left Ear: External ear normal.  Nose: Nose normal.  Mouth/Throat: Oropharynx is clear and moist.  Eyes: Conjunctivae are normal. Pupils are equal, round, and reactive to light. Right eye exhibits no discharge. Left eye exhibits no discharge.  Neck: Normal range of motion. Neck supple. No JVD present. No tracheal deviation present. No thyromegaly present.  Cardiovascular: Normal rate, regular rhythm and normal heart sounds.  Pulmonary/Chest: No stridor. No respiratory distress. She has no wheezes.  Abdominal: Soft. Bowel sounds are normal. She exhibits no distension and no mass. There is no tenderness. There is no rebound and no guarding.  Musculoskeletal: She exhibits no edema or tenderness.  Lymphadenopathy:    She has no cervical adenopathy.  Neurological: She displays normal reflexes. No cranial nerve deficit. She exhibits normal muscle tone. Coordination normal.  Skin: No  rash noted. No erythema.  Psychiatric: She has a normal mood and affect. Her behavior is normal. Judgment and thought content normal.    Lab Results  Component Value Date   WBC 7.7 03/17/2016   HGB 15.5 (H) 03/17/2016   HCT 45.3 03/17/2016   PLT 255.0 03/17/2016   GLUCOSE 120 (H) 03/17/2016   CHOL 256 (H) 03/17/2016   TRIG 270.0 (H) 03/17/2016   HDL 46.80 03/17/2016   LDLDIRECT 171.0 03/17/2016   LDLCALC 203 (H)  12/15/2014   ALT 59 (H) 03/17/2016   AST 33 03/17/2016   NA 139 03/17/2016   K 4.7 03/17/2016   CL 105 03/17/2016   CREATININE 0.94 03/17/2016   BUN 13 03/17/2016   CO2 30 03/17/2016   TSH 6.26 (H) 03/17/2016   HGBA1C 5.7 07/16/2015    US Abdomen Complete  Result Date: 07/22/2014 CLINICAL DATA:  Elevated LFTs EXAM: ULTRASOUND ABDOMEN COMPLETE COMPARISON:  09/14/2011 FINDINGS: Gallbladder: No gallstones, gallbladder wall thickening, or pericholecystic fluid. 3 mm cholesterol polyp. Negative sonographic Murphy's sign. Common bile duct: Diameter: 2 mm Liver: Hyperechoic hepatic parenchyma, reflecting hepatic steatosis. IVC: No abnormality visualized. Pancreas: Visualized portion unremarkable. Spleen: Size and appearance within normal limits. Right Kidney: Length: 11.4 cm.  No mass or hydronephrosis. Left Kidney: Length: 10.6 cm.  No mass or hydronephrosis. Abdominal aorta: No aneurysm visualized. Other findings: None. IMPRESSION: Hepatic steatosis. Electronically Signed   By: Julian Hy M.D.   On: 07/22/2014 08:27    Assessment & Plan:   There are no diagnoses linked to this encounter. I have discontinued Kayla Aguirre's cefdinir. I am also having her maintain her loratadine, Vitamin B-12, diclofenac sodium, atorvastatin, zolpidem, indapamide, levothyroxine, and amLODipine-valsartan.  No orders of the defined types were placed in this encounter.    Follow-up: No Follow-up on file.  Walker Kehr, MD

## 2017-10-24 NOTE — Assessment & Plan Note (Signed)
Cefdinir po 

## 2017-10-24 NOTE — Assessment & Plan Note (Signed)
On Vit D 

## 2017-10-25 ENCOUNTER — Other Ambulatory Visit: Payer: Self-pay | Admitting: Internal Medicine

## 2017-10-25 MED ORDER — VITAMIN D3 1.25 MG (50000 UT) PO CAPS: 1.0000 | ORAL_CAPSULE | ORAL | 0 refills | Status: DC

## 2017-10-25 MED ORDER — VITAMIN D3 50 MCG (2000 UT) PO CAPS: 2000.0000 [IU] | ORAL_CAPSULE | Freq: Every day | ORAL | 3 refills | Status: DC

## 2017-10-31 ENCOUNTER — Encounter: Payer: BLUE CROSS/BLUE SHIELD | Admitting: Internal Medicine

## 2018-01-22 ENCOUNTER — Ambulatory Visit (INDEPENDENT_AMBULATORY_CARE_PROVIDER_SITE_OTHER): Payer: Medicare HMO | Admitting: Internal Medicine

## 2018-01-22 ENCOUNTER — Encounter: Payer: Self-pay | Admitting: Internal Medicine

## 2018-01-22 DIAGNOSIS — E034 Atrophy of thyroid (acquired): Secondary | ICD-10-CM | POA: Diagnosis not present

## 2018-01-22 DIAGNOSIS — M25561 Pain in right knee: Secondary | ICD-10-CM | POA: Diagnosis not present

## 2018-01-22 DIAGNOSIS — E559 Vitamin D deficiency, unspecified: Secondary | ICD-10-CM

## 2018-01-22 DIAGNOSIS — E785 Hyperlipidemia, unspecified: Secondary | ICD-10-CM

## 2018-01-22 DIAGNOSIS — E538 Deficiency of other specified B group vitamins: Secondary | ICD-10-CM | POA: Diagnosis not present

## 2018-01-22 DIAGNOSIS — I10 Essential (primary) hypertension: Secondary | ICD-10-CM

## 2018-01-22 NOTE — Assessment & Plan Note (Signed)
Vit D 

## 2018-01-22 NOTE — Assessment & Plan Note (Signed)
TSH 

## 2018-01-22 NOTE — Assessment & Plan Note (Signed)
On B12 

## 2018-01-22 NOTE — Progress Notes (Signed)
Subjective:  Patient ID: Kayla Aguirre, female    DOB: 03-03-1951  Age: 67 y.o. MRN: [id number]  CC: No chief complaint on file.   HPI Zoii Florer presents for dyslipidemia, HTN, B12 def f/u C/o R knee pain  Outpatient Medications Prior to Visit  Medication Sig Dispense Refill  . amLODipine-valsartan (EXFORGE) 10-160 MG tablet Take 1 tablet by mouth daily. 90 tablet 3  . atorvastatin (LIPITOR) 10 MG tablet Take 1 tablet (10 mg total) by mouth daily. 90 tablet 3  . cefdinir (OMNICEF) 300 MG capsule Take 1 capsule (300 mg total) by mouth 2 (two) times daily. 20 capsule 0  . Cholecalciferol (VITAMIN D3) 2000 units capsule Take 1 capsule (2,000 Units total) by mouth daily. 100 capsule 3  . Cholecalciferol (VITAMIN D3) 50000 units CAPS Take 1 capsule by mouth once a week. 6 capsule 0  . Cyanocobalamin (VITAMIN B-12) 1000 MCG SUBL Place 1 tablet (1,000 mcg total) under the tongue daily. 30 tablet 3  . diclofenac sodium (VOLTAREN) 1 % GEL Apply 2 g topically 4 (four) times daily. 100 g 3  . hydrochlorothiazide (HYDRODIURIL) 25 MG tablet Take 1 tablet (25 mg total) by mouth daily. 90 tablet 3  . indapamide (LOZOL) 2.5 MG tablet Take 1 tablet (2.5 mg total) by mouth daily. 90 tablet 3  . levothyroxine (LEVOTHROID) 25 MCG tablet Take 1 tablet (25 mcg total) by mouth daily. 90 tablet 3  . loratadine (CLARITIN) 10 MG tablet Take 1 tablet (10 mg total) by mouth daily. 100 tablet 3  . zolpidem (AMBIEN) 10 MG tablet Take 1 tablet (10 mg total) by mouth at bedtime as needed. 30 tablet 3   No facility-administered medications prior to visit.     ROS Review of Systems  Constitutional: Negative for activity change, appetite change, chills, fatigue and unexpected weight change.  HENT: Negative for congestion, mouth sores and sinus pressure.   Eyes: Negative for visual disturbance.  Respiratory: Negative for cough and chest tightness.   Gastrointestinal: Negative for abdominal pain and nausea.    Genitourinary: Negative for difficulty urinating, frequency and vaginal pain.  Musculoskeletal: Negative for back pain and gait problem.  Skin: Negative for pallor and rash.  Neurological: Negative for dizziness, tremors, weakness, numbness and headaches.  Psychiatric/Behavioral: Negative for confusion and sleep disturbance.    Objective:  BP 126/82 (BP Location: Left Arm, Patient Position: Sitting, Cuff Size: Large)   Pulse 81   Temp 99.2 F (37.3 C) (Oral)   Ht 5\' 5"  (1.651 m)   Wt 188 lb (85.3 kg)   SpO2 98%   BMI 31.28 kg/m   BP Readings from Last 3 Encounters:  01/22/18 126/82  10/24/17 118/76  05/31/16 (!) 140/100    Wt Readings from Last 3 Encounters:  01/22/18 188 lb (85.3 kg)  10/24/17 186 lb (84.4 kg)  05/31/16 191 lb 0.6 oz (86.7 kg)    Physical Exam  Constitutional: She appears well-developed. No distress.  HENT:  Head: Normocephalic.  Right Ear: External ear normal.  Left Ear: External ear normal.  Nose: Nose normal.  Mouth/Throat: Oropharynx is clear and moist.  Eyes: Pupils are equal, round, and reactive to light. Conjunctivae are normal. Right eye exhibits no discharge. Left eye exhibits no discharge.  Neck: Normal range of motion. Neck supple. No JVD present. No tracheal deviation present. No thyromegaly present.  Cardiovascular: Normal rate, regular rhythm and normal heart sounds.  Pulmonary/Chest: No stridor. No respiratory distress. She has no wheezes.  Abdominal:  Soft. Bowel sounds are normal. She exhibits no distension and no mass. There is no tenderness. There is no rebound and no guarding.  Musculoskeletal: She exhibits no edema or tenderness.  Lymphadenopathy:    She has no cervical adenopathy.  Neurological: She displays normal reflexes. No cranial nerve deficit. She exhibits normal muscle tone. Coordination normal.  Skin: No rash noted. No erythema.  Psychiatric: She has a normal mood and affect. Her behavior is normal. Judgment and thought  content normal.  R knee w/decr ROM, posterior fullness  Lab Results  Component Value Date   WBC 9.8 10/24/2017   HGB 15.1 (H) 10/24/2017   HCT 43.5 10/24/2017   PLT 258.0 10/24/2017   GLUCOSE 102 (H) 10/24/2017   CHOL 260 (H) 10/24/2017   TRIG 164.0 (H) 10/24/2017   HDL 54.70 10/24/2017   LDLDIRECT 171.0 03/17/2016   LDLCALC 172 (H) 10/24/2017   ALT 39 (H) 10/24/2017   AST 27 10/24/2017   NA 140 10/24/2017   K 3.9 10/24/2017   CL 102 10/24/2017   CREATININE 0.88 10/24/2017   BUN 16 10/24/2017   CO2 30 10/24/2017   TSH 3.99 10/24/2017   HGBA1C 5.7 07/16/2015    US Abdomen Complete  Result Date: 07/22/2014 CLINICAL DATA:  Elevated LFTs EXAM: ULTRASOUND ABDOMEN COMPLETE COMPARISON:  09/14/2011 FINDINGS: Gallbladder: No gallstones, gallbladder wall thickening, or pericholecystic fluid. 3 mm cholesterol polyp. Negative sonographic Murphy's sign. Common bile duct: Diameter: 2 mm Liver: Hyperechoic hepatic parenchyma, reflecting hepatic steatosis. IVC: No abnormality visualized. Pancreas: Visualized portion unremarkable. Spleen: Size and appearance within normal limits. Right Kidney: Length: 11.4 cm.  No mass or hydronephrosis. Left Kidney: Length: 10.6 cm.  No mass or hydronephrosis. Abdominal aorta: No aneurysm visualized. Other findings: None. IMPRESSION: Hepatic steatosis. Electronically Signed   By: Julian Hy M.D.   On: 07/22/2014 08:27    Assessment & Plan:   There are no diagnoses linked to this encounter. I am having Kayla Aguirre maintain her loratadine, Vitamin B-12, diclofenac sodium, atorvastatin, zolpidem, indapamide, levothyroxine, cefdinir, amLODipine-valsartan, hydrochlorothiazide, Vitamin D3, and Vitamin D3.  No orders of the defined types were placed in this encounter.    Follow-up: No follow-ups on file.  Walker Kehr, MD

## 2018-01-22 NOTE — Assessment & Plan Note (Signed)
On Exforge

## 2018-01-22 NOTE — Assessment & Plan Note (Signed)
Ref to Ortho

## 2018-01-22 NOTE — Assessment & Plan Note (Signed)
Re-start Lipitor Risks associated with treatment noncompliance were discussed. Compliance was encouraged.

## 2018-01-25 ENCOUNTER — Telehealth: Payer: Self-pay | Admitting: Internal Medicine

## 2018-01-25 NOTE — Telephone Encounter (Signed)
Pt came in and needs a refill ofher LIPITOR, EXFORGE,  And , VIT D3 50000,  Please send to [facility]

## 2018-01-26 MED ORDER — VITAMIN D3 1.25 MG (50000 UT) PO CAPS: 1.0000 | ORAL_CAPSULE | ORAL | 0 refills | Status: DC

## 2018-01-26 MED ORDER — AMLODIPINE BESYLATE-VALSARTAN 10-160 MG PO TABS: 1.0000 | ORAL_TABLET | Freq: Every day | ORAL | 3 refills | Status: DC

## 2018-01-26 MED ORDER — ATORVASTATIN CALCIUM 10 MG PO TABS: 10.0000 mg | ORAL_TABLET | Freq: Every day | ORAL | 3 refills | Status: DC

## 2018-01-26 NOTE — Addendum Note (Signed)
Addended by: Karren Cobble on: 01/26/2018 04:24 PM   Modules accepted: Orders

## 2018-01-26 NOTE — Telephone Encounter (Signed)
RX sent

## 2018-01-29 DIAGNOSIS — M1711 Unilateral primary osteoarthritis, right knee: Secondary | ICD-10-CM | POA: Diagnosis not present

## 2018-02-02 DIAGNOSIS — M1711 Unilateral primary osteoarthritis, right knee: Secondary | ICD-10-CM | POA: Diagnosis not present

## 2018-02-02 DIAGNOSIS — M25561 Pain in right knee: Secondary | ICD-10-CM | POA: Diagnosis not present

## 2018-04-10 ENCOUNTER — Other Ambulatory Visit (INDEPENDENT_AMBULATORY_CARE_PROVIDER_SITE_OTHER): Payer: Medicare HMO

## 2018-04-10 DIAGNOSIS — E559 Vitamin D deficiency, unspecified: Secondary | ICD-10-CM

## 2018-04-10 DIAGNOSIS — E785 Hyperlipidemia, unspecified: Secondary | ICD-10-CM

## 2018-04-10 LAB — BASIC METABOLIC PANEL
BUN: 15 mg/dL (ref 6–23)
CALCIUM: 9.3 mg/dL (ref 8.4–10.5)
CO2: 27 meq/L (ref 19–32)
Chloride: 103 mEq/L (ref 96–112)
Creatinine, Ser: 1.01 mg/dL (ref 0.40–1.20)
GFR: 58.27 mL/min — AB (ref >60.00)
Glucose, Bld: 131 mg/dL — ABNORMAL HIGH (ref 70–99)
Potassium: 3.9 mEq/L (ref 3.5–5.1)
Sodium: 140 mEq/L (ref 135–145)

## 2018-04-10 LAB — HEPATIC FUNCTION PANEL
ALT: 70 U/L — AB (ref 0–35)
AST: 43 U/L — ABNORMAL HIGH (ref 0–37)
Albumin: 4.1 g/dL (ref 3.5–5.2)
Alkaline Phosphatase: 80 U/L (ref 39–117)
BILIRUBIN TOTAL: 1 mg/dL (ref 0.2–1.2)
Bilirubin, Direct: 0.1 mg/dL (ref 0.0–0.3)
Total Protein: 7.5 g/dL (ref 6.0–8.3)

## 2018-04-10 LAB — LIPID PANEL
Cholesterol: 280 mg/dL — ABNORMAL HIGH (ref 0–200)
HDL: 49.2 mg/dL (ref >39.00)
NONHDL: 230.68
TRIGLYCERIDES: 205 mg/dL — AB (ref 0.0–149.0)
Total CHOL/HDL Ratio: 6
VLDL: 41 mg/dL — AB (ref 0.0–40.0)

## 2018-04-10 LAB — LDL CHOLESTEROL, DIRECT: Direct LDL: 193 mg/dL

## 2018-04-10 LAB — VITAMIN D 25 HYDROXY (VIT D DEFICIENCY, FRACTURES): VITD: 19.52 ng/mL — ABNORMAL LOW (ref 30.00–100.00)

## 2018-04-26 ENCOUNTER — Encounter: Payer: Self-pay | Admitting: Internal Medicine

## 2018-04-26 ENCOUNTER — Ambulatory Visit (INDEPENDENT_AMBULATORY_CARE_PROVIDER_SITE_OTHER): Payer: Medicare HMO | Admitting: Internal Medicine

## 2018-04-26 VITALS — BP 128/84 | HR 88 | Temp 98.2°F | Ht 65.0 in | Wt 192.0 lb

## 2018-04-26 DIAGNOSIS — I10 Essential (primary) hypertension: Secondary | ICD-10-CM | POA: Diagnosis not present

## 2018-04-26 DIAGNOSIS — E538 Deficiency of other specified B group vitamins: Secondary | ICD-10-CM

## 2018-04-26 DIAGNOSIS — D751 Secondary polycythemia: Secondary | ICD-10-CM | POA: Diagnosis not present

## 2018-04-26 DIAGNOSIS — E559 Vitamin D deficiency, unspecified: Secondary | ICD-10-CM

## 2018-04-26 DIAGNOSIS — E034 Atrophy of thyroid (acquired): Secondary | ICD-10-CM | POA: Diagnosis not present

## 2018-04-26 MED ORDER — AMLODIPINE BESYLATE-VALSARTAN 10-160 MG PO TABS: 1.0000 | ORAL_TABLET | Freq: Every day | ORAL | 3 refills | Status: DC

## 2018-04-26 MED ORDER — ATORVASTATIN CALCIUM 10 MG PO TABS: 10.0000 mg | ORAL_TABLET | Freq: Every day | ORAL | 3 refills | Status: DC

## 2018-04-26 MED ORDER — CEPHALEXIN 500 MG PO CAPS: 500.0000 mg | ORAL_CAPSULE | Freq: Two times a day (BID) | ORAL | 1 refills | Status: DC

## 2018-04-26 MED ORDER — VITAMIN D3 1.25 MG (50000 UT) PO CAPS: 1.0000 | ORAL_CAPSULE | ORAL | 3 refills | Status: DC

## 2018-04-26 MED ORDER — HYDROCHLOROTHIAZIDE 25 MG PO TABS: 25.0000 mg | ORAL_TABLET | Freq: Every day | ORAL | 3 refills | Status: DC

## 2018-04-26 NOTE — Assessment & Plan Note (Signed)
Exforge 

## 2018-04-26 NOTE — Progress Notes (Signed)
Subjective:  Patient ID: Kayla Aguirre, female    DOB: Nov 11, 1950  Age: 67 y.o. MRN: [id number]  CC: No chief complaint on file.   HPI Kayla Aguirre presents for HTN, dyslipidemia, B12 def, D def f/u  Outpatient Medications Prior to Visit  Medication Sig Dispense Refill  . amLODipine-valsartan (EXFORGE) 10-160 MG tablet Take 1 tablet by mouth daily. 90 tablet 3  . atorvastatin (LIPITOR) 10 MG tablet Take 1 tablet (10 mg total) by mouth daily. 90 tablet 3  . Cholecalciferol (VITAMIN D3) 2000 units capsule Take 1 capsule (2,000 Units total) by mouth daily. 100 capsule 3  . Cholecalciferol (VITAMIN D3) 50000 units CAPS Take 1 capsule by mouth once a week. 6 capsule 0  . Cyanocobalamin (VITAMIN B-12) 1000 MCG SUBL Place 1 tablet (1,000 mcg total) under the tongue daily. 30 tablet 3  . diclofenac sodium (VOLTAREN) 1 % GEL Apply 2 g topically 4 (four) times daily. 100 g 3  . hydrochlorothiazide (HYDRODIURIL) 25 MG tablet Take 1 tablet (25 mg total) by mouth daily. 90 tablet 3  . loratadine (CLARITIN) 10 MG tablet Take 1 tablet (10 mg total) by mouth daily. 100 tablet 3   No facility-administered medications prior to visit.     ROS: Review of Systems  Constitutional: Negative for activity change, appetite change, chills, fatigue and unexpected weight change.  HENT: Negative for congestion, mouth sores and sinus pressure.   Eyes: Negative for visual disturbance.  Respiratory: Negative for cough and chest tightness.   Gastrointestinal: Negative for abdominal pain and nausea.  Genitourinary: Negative for difficulty urinating, frequency and vaginal pain.  Musculoskeletal: Negative for back pain and gait problem.  Skin: Negative for pallor and rash.  Neurological: Negative for dizziness, tremors, weakness, numbness and headaches.  Psychiatric/Behavioral: Negative for confusion, sleep disturbance and suicidal ideas.    Objective:  BP 128/84 (BP Location: Left Arm, Patient Position:  Sitting, Cuff Size: Normal)   Pulse 88   Temp 98.2 F (36.8 C) (Oral)   Ht 5\' 5"  (1.651 m)   Wt 192 lb (87.1 kg)   SpO2 94%   BMI 31.95 kg/m   BP Readings from Last 3 Encounters:  04/26/18 128/84  01/22/18 126/82  10/24/17 118/76    Wt Readings from Last 3 Encounters:  04/26/18 192 lb (87.1 kg)  01/22/18 188 lb (85.3 kg)  10/24/17 186 lb (84.4 kg)    Physical Exam  Constitutional: She appears well-developed. No distress.  HENT:  Head: Normocephalic.  Right Ear: External ear normal.  Left Ear: External ear normal.  Nose: Nose normal.  Mouth/Throat: Oropharynx is clear and moist.  Eyes: Pupils are equal, round, and reactive to light. Conjunctivae are normal. Right eye exhibits no discharge. Left eye exhibits no discharge.  Neck: Normal range of motion. Neck supple. No JVD present. No tracheal deviation present. No thyromegaly present.  Cardiovascular: Normal rate, regular rhythm and normal heart sounds.  Pulmonary/Chest: No stridor. No respiratory distress. She has no wheezes.  Abdominal: Soft. Bowel sounds are normal. She exhibits no distension and no mass. There is no tenderness. There is no rebound and no guarding.  Musculoskeletal: She exhibits no edema or tenderness.  Lymphadenopathy:    She has no cervical adenopathy.  Neurological: She displays normal reflexes. No cranial nerve deficit. She exhibits normal muscle tone. Coordination normal.  Skin: No rash noted. No erythema.  Psychiatric: She has a normal mood and affect. Her behavior is normal. Judgment and thought content normal.  Lab Results  Component Value Date   WBC 9.8 10/24/2017   HGB 15.1 (H) 10/24/2017   HCT 43.5 10/24/2017   PLT 258.0 10/24/2017   GLUCOSE 131 (H) 04/10/2018   CHOL 280 (H) 04/10/2018   TRIG 205.0 (H) 04/10/2018   HDL 49.20 04/10/2018   LDLDIRECT 193.0 04/10/2018   LDLCALC 172 (H) 10/24/2017   ALT 70 (H) 04/10/2018   AST 43 (H) 04/10/2018   NA 140 04/10/2018   K 3.9 04/10/2018     CL 103 04/10/2018   CREATININE 1.01 04/10/2018   BUN 15 04/10/2018   CO2 27 04/10/2018   TSH 3.99 10/24/2017   HGBA1C 5.7 07/16/2015    US Abdomen Complete  Result Date: 07/22/2014 CLINICAL DATA:  Elevated LFTs EXAM: ULTRASOUND ABDOMEN COMPLETE COMPARISON:  09/14/2011 FINDINGS: Gallbladder: No gallstones, gallbladder wall thickening, or pericholecystic fluid. 3 mm cholesterol polyp. Negative sonographic Murphy's sign. Common bile duct: Diameter: 2 mm Liver: Hyperechoic hepatic parenchyma, reflecting hepatic steatosis. IVC: No abnormality visualized. Pancreas: Visualized portion unremarkable. Spleen: Size and appearance within normal limits. Right Kidney: Length: 11.4 cm.  No mass or hydronephrosis. Left Kidney: Length: 10.6 cm.  No mass or hydronephrosis. Abdominal aorta: No aneurysm visualized. Other findings: None. IMPRESSION: Hepatic steatosis. Electronically Signed   By: Julian Hy M.D.   On: 07/22/2014 08:27    Assessment & Plan:   There are no diagnoses linked to this encounter.   No orders of the defined types were placed in this encounter.    Follow-up: No follow-ups on file.  Walker Kehr, MD

## 2018-04-26 NOTE — Assessment & Plan Note (Signed)
Re-start B12 

## 2018-04-26 NOTE — Assessment & Plan Note (Signed)
Labs

## 2018-04-26 NOTE — Assessment & Plan Note (Signed)
CBC

## 2018-08-29 ENCOUNTER — Ambulatory Visit (INDEPENDENT_AMBULATORY_CARE_PROVIDER_SITE_OTHER): Payer: Medicare HMO | Admitting: Internal Medicine

## 2018-08-29 ENCOUNTER — Other Ambulatory Visit (INDEPENDENT_AMBULATORY_CARE_PROVIDER_SITE_OTHER): Payer: Medicare HMO

## 2018-08-29 ENCOUNTER — Encounter: Payer: Self-pay | Admitting: Internal Medicine

## 2018-08-29 DIAGNOSIS — E034 Atrophy of thyroid (acquired): Secondary | ICD-10-CM | POA: Diagnosis not present

## 2018-08-29 DIAGNOSIS — I10 Essential (primary) hypertension: Secondary | ICD-10-CM

## 2018-08-29 DIAGNOSIS — L259 Unspecified contact dermatitis, unspecified cause: Secondary | ICD-10-CM | POA: Insufficient documentation

## 2018-08-29 DIAGNOSIS — D751 Secondary polycythemia: Secondary | ICD-10-CM | POA: Diagnosis not present

## 2018-08-29 DIAGNOSIS — E538 Deficiency of other specified B group vitamins: Secondary | ICD-10-CM

## 2018-08-29 DIAGNOSIS — E785 Hyperlipidemia, unspecified: Secondary | ICD-10-CM | POA: Diagnosis not present

## 2018-08-29 DIAGNOSIS — D508 Other iron deficiency anemias: Secondary | ICD-10-CM | POA: Diagnosis not present

## 2018-08-29 DIAGNOSIS — L255 Unspecified contact dermatitis due to plants, except food: Secondary | ICD-10-CM | POA: Diagnosis not present

## 2018-08-29 DIAGNOSIS — E559 Vitamin D deficiency, unspecified: Secondary | ICD-10-CM | POA: Diagnosis not present

## 2018-08-29 LAB — HEPATIC FUNCTION PANEL
ALT: 53 U/L — ABNORMAL HIGH (ref 0–35)
AST: 38 U/L — ABNORMAL HIGH (ref 0–37)
Albumin: 4.2 g/dL (ref 3.5–5.2)
Alkaline Phosphatase: 91 U/L (ref 39–117)
Bilirubin, Direct: 0.1 mg/dL (ref 0.0–0.3)
Total Bilirubin: 0.8 mg/dL (ref 0.2–1.2)
Total Protein: 7.5 g/dL (ref 6.0–8.3)

## 2018-08-29 LAB — CBC WITH DIFFERENTIAL/PLATELET
Basophils Absolute: 0.1 10*3/uL (ref 0.0–0.1)
Basophils Relative: 0.7 % (ref 0.0–3.0)
Eosinophils Absolute: 0.1 10*3/uL (ref 0.0–0.7)
Eosinophils Relative: 0.8 % (ref 0.0–5.0)
HCT: 44.8 % (ref 36.0–46.0)
HEMOGLOBIN: 15.4 g/dL — AB (ref 12.0–15.0)
Lymphocytes Relative: 24.3 % (ref 12.0–46.0)
Lymphs Abs: 1.9 10*3/uL (ref 0.7–4.0)
MCHC: 34.3 g/dL (ref 30.0–36.0)
MCV: 83.7 fl (ref 78.0–100.0)
MONO ABS: 0.7 10*3/uL (ref 0.1–1.0)
Monocytes Relative: 8.4 % (ref 3.0–12.0)
NEUTROS ABS: 5.2 10*3/uL (ref 1.4–7.7)
Neutrophils Relative %: 65.8 % (ref 43.0–77.0)
Platelets: 271 10*3/uL (ref 150.0–400.0)
RBC: 5.35 Mil/uL — ABNORMAL HIGH (ref 3.87–5.11)
RDW: 13.7 % (ref 11.5–15.5)
WBC: 7.8 10*3/uL (ref 4.0–10.5)

## 2018-08-29 LAB — BASIC METABOLIC PANEL
BUN: 15 mg/dL (ref 6–23)
CO2: 30 mEq/L (ref 19–32)
Calcium: 9.6 mg/dL (ref 8.4–10.5)
Chloride: 103 mEq/L (ref 96–112)
Creatinine, Ser: 0.93 mg/dL (ref 0.40–1.20)
GFR: 64.01 mL/min (ref >60.00)
Glucose, Bld: 138 mg/dL — ABNORMAL HIGH (ref 70–99)
Potassium: 4 mEq/L (ref 3.5–5.1)
SODIUM: 141 meq/L (ref 135–145)

## 2018-08-29 LAB — VITAMIN D 25 HYDROXY (VIT D DEFICIENCY, FRACTURES): VITD: 16.12 ng/mL — AB (ref 30.00–100.00)

## 2018-08-29 LAB — TSH: TSH: 4.17 u[IU]/mL (ref 0.35–4.50)

## 2018-08-29 MED ORDER — HYDROCHLOROTHIAZIDE 25 MG PO TABS: 25.0000 mg | ORAL_TABLET | Freq: Every day | ORAL | 3 refills | Status: DC

## 2018-08-29 MED ORDER — AMLODIPINE BESYLATE-VALSARTAN 10-160 MG PO TABS: 1.0000 | ORAL_TABLET | Freq: Every day | ORAL | 3 refills | Status: DC

## 2018-08-29 MED ORDER — TRIAMCINOLONE ACETONIDE 0.5 % EX CREA: 1.0000 "application " | TOPICAL_CREAM | Freq: Four times a day (QID) | CUTANEOUS | 1 refills | Status: DC

## 2018-08-29 MED ORDER — ATORVASTATIN CALCIUM 10 MG PO TABS: 10.0000 mg | ORAL_TABLET | Freq: Every day | ORAL | 3 refills | Status: DC

## 2018-08-29 MED ORDER — CEPHALEXIN 500 MG PO CAPS: 500.0000 mg | ORAL_CAPSULE | Freq: Two times a day (BID) | ORAL | 1 refills | Status: DC

## 2018-08-29 NOTE — Progress Notes (Signed)
Subjective:  Patient ID: Kayla Aguirre, female    DOB: 02/20/1951  Age: 67 y.o. MRN: [id number]  CC: No chief complaint on file.   HPI Disney Ruggiero presents for itchy rash on face after they cut a tree on Sunday. Rash appeared on Monday F/u HTN, B12 def, Vit D def, dyslipidemia   Outpatient Medications Prior to Visit  Medication Sig Dispense Refill  . amLODipine-valsartan (EXFORGE) 10-160 MG tablet Take 1 tablet by mouth daily. 90 tablet 3  . atorvastatin (LIPITOR) 10 MG tablet Take 1 tablet (10 mg total) by mouth daily. 90 tablet 3  . cephALEXin (KEFLEX) 500 MG capsule Take 1 capsule (500 mg total) by mouth 2 (two) times daily. Use prn bladder infection 6 capsule 1  . Cholecalciferol (VITAMIN D3) 50000 units CAPS Take 1 capsule by mouth every 30 (thirty) days. 3 capsule 3  . Cyanocobalamin (VITAMIN B-12) 1000 MCG SUBL Place 1 tablet (1,000 mcg total) under the tongue daily. 30 tablet 3  . diclofenac sodium (VOLTAREN) 1 % GEL Apply 2 g topically 4 (four) times daily. 100 g 3  . hydrochlorothiazide (HYDRODIURIL) 25 MG tablet Take 1 tablet (25 mg total) by mouth daily. 90 tablet 3  . loratadine (CLARITIN) 10 MG tablet Take 1 tablet (10 mg total) by mouth daily. 100 tablet 3   No facility-administered medications prior to visit.     ROS: Review of Systems  Constitutional: Negative for activity change, appetite change, chills, fatigue and unexpected weight change.  HENT: Negative for congestion, mouth sores and sinus pressure.   Eyes: Negative for visual disturbance.  Respiratory: Negative for cough and chest tightness.   Gastrointestinal: Negative for abdominal pain and nausea.  Genitourinary: Negative for difficulty urinating, frequency and vaginal pain.  Musculoskeletal: Negative for back pain and gait problem.  Skin: Negative for pallor and rash.  Neurological: Negative for dizziness, tremors, weakness, numbness and headaches.  Psychiatric/Behavioral: Negative for confusion,  sleep disturbance and suicidal ideas.    Objective:  BP 124/82 (BP Location: Left Arm, Patient Position: Sitting, Cuff Size: Normal)   Pulse 91   Temp (!) 97.4 F (36.3 C) (Oral)   Ht 5\' 5"  (1.651 m)   Wt 192 lb (87.1 kg)   SpO2 96%   BMI 31.95 kg/m   BP Readings from Last 3 Encounters:  08/29/18 124/82  04/26/18 128/84  01/22/18 126/82    Wt Readings from Last 3 Encounters:  08/29/18 192 lb (87.1 kg)  04/26/18 192 lb (87.1 kg)  01/22/18 188 lb (85.3 kg)    Physical Exam  Constitutional: She appears well-developed. No distress.  HENT:  Head: Normocephalic.  Right Ear: External ear normal.  Left Ear: External ear normal.  Nose: Nose normal.  Mouth/Throat: Oropharynx is clear and moist.  Eyes: Pupils are equal, round, and reactive to light. Conjunctivae are normal. Right eye exhibits no discharge. Left eye exhibits no discharge.  Neck: Normal range of motion. Neck supple. No JVD present. No tracheal deviation present. No thyromegaly present.  Cardiovascular: Normal rate, regular rhythm and normal heart sounds.  Pulmonary/Chest: No stridor. No respiratory distress. She has no wheezes.  Abdominal: Soft. Bowel sounds are normal. She exhibits no distension and no mass. There is no tenderness. There is no rebound and no guarding.  Musculoskeletal: She exhibits no edema or tenderness.  Lymphadenopathy:    She has no cervical adenopathy.  Neurological: She displays normal reflexes. No cranial nerve deficit. She exhibits normal muscle tone. Coordination normal.  Skin: No  rash noted. No erythema.  Psychiatric: She has a normal mood and affect. Her behavior is normal. Judgment and thought content normal.  eryth rash on R cheek and R hand  Lab Results  Component Value Date   WBC 9.8 10/24/2017   HGB 15.1 (H) 10/24/2017   HCT 43.5 10/24/2017   PLT 258.0 10/24/2017   GLUCOSE 131 (H) 04/10/2018   CHOL 280 (H) 04/10/2018   TRIG 205.0 (H) 04/10/2018   HDL 49.20 04/10/2018    LDLDIRECT 193.0 04/10/2018   LDLCALC 172 (H) 10/24/2017   ALT 70 (H) 04/10/2018   AST 43 (H) 04/10/2018   NA 140 04/10/2018   K 3.9 04/10/2018   CL 103 04/10/2018   CREATININE 1.01 04/10/2018   BUN 15 04/10/2018   CO2 27 04/10/2018   TSH 3.99 10/24/2017   HGBA1C 5.7 07/16/2015    US Abdomen Complete  Result Date: 07/22/2014 CLINICAL DATA:  Elevated LFTs EXAM: ULTRASOUND ABDOMEN COMPLETE COMPARISON:  09/14/2011 FINDINGS: Gallbladder: No gallstones, gallbladder wall thickening, or pericholecystic fluid. 3 mm cholesterol polyp. Negative sonographic Murphy's sign. Common bile duct: Diameter: 2 mm Liver: Hyperechoic hepatic parenchyma, reflecting hepatic steatosis. IVC: No abnormality visualized. Pancreas: Visualized portion unremarkable. Spleen: Size and appearance within normal limits. Right Kidney: Length: 11.4 cm.  No mass or hydronephrosis. Left Kidney: Length: 10.6 cm.  No mass or hydronephrosis. Abdominal aorta: No aneurysm visualized. Other findings: None. IMPRESSION: Hepatic steatosis. Electronically Signed   By: Julian Hy M.D.   On: 07/22/2014 08:27    Assessment & Plan:   There are no diagnoses linked to this encounter.   No orders of the defined types were placed in this encounter.    Follow-up: No follow-ups on file.  Walker Kehr, MD

## 2018-08-29 NOTE — Assessment & Plan Note (Signed)
[facility]

## 2018-08-29 NOTE — Patient Instructions (Signed)

## 2018-08-29 NOTE — Assessment & Plan Note (Signed)
CBC

## 2018-08-29 NOTE — Assessment & Plan Note (Signed)
Triamc qid Pt refused oral steroids

## 2018-08-29 NOTE — Assessment & Plan Note (Signed)
On Vit D 

## 2018-08-29 NOTE — Assessment & Plan Note (Signed)
cardiac CT scan for calcium scoring offered 

## 2018-08-29 NOTE — Assessment & Plan Note (Signed)
On B12 

## 2018-11-28 ENCOUNTER — Ambulatory Visit (INDEPENDENT_AMBULATORY_CARE_PROVIDER_SITE_OTHER): Payer: Medicare HMO | Admitting: Internal Medicine

## 2018-11-28 ENCOUNTER — Encounter: Payer: Self-pay | Admitting: Internal Medicine

## 2018-11-28 ENCOUNTER — Other Ambulatory Visit: Payer: Self-pay

## 2018-11-28 ENCOUNTER — Other Ambulatory Visit (INDEPENDENT_AMBULATORY_CARE_PROVIDER_SITE_OTHER): Payer: Medicare HMO

## 2018-11-28 VITALS — BP 128/82 | HR 84 | Temp 98.0°F | Ht 65.0 in | Wt 198.0 lb

## 2018-11-28 DIAGNOSIS — E559 Vitamin D deficiency, unspecified: Secondary | ICD-10-CM

## 2018-11-28 DIAGNOSIS — E785 Hyperlipidemia, unspecified: Secondary | ICD-10-CM

## 2018-11-28 DIAGNOSIS — L255 Unspecified contact dermatitis due to plants, except food: Secondary | ICD-10-CM | POA: Diagnosis not present

## 2018-11-28 DIAGNOSIS — E034 Atrophy of thyroid (acquired): Secondary | ICD-10-CM

## 2018-11-28 DIAGNOSIS — I10 Essential (primary) hypertension: Secondary | ICD-10-CM

## 2018-11-28 DIAGNOSIS — D751 Secondary polycythemia: Secondary | ICD-10-CM | POA: Diagnosis not present

## 2018-11-28 DIAGNOSIS — E538 Deficiency of other specified B group vitamins: Secondary | ICD-10-CM | POA: Diagnosis not present

## 2018-11-28 LAB — LIPID PANEL
Cholesterol: 271 mg/dL — ABNORMAL HIGH (ref 0–200)
HDL: 54.1 mg/dL (ref >39.00)
NonHDL: 217.02
Total CHOL/HDL Ratio: 5
Triglycerides: 290 mg/dL — ABNORMAL HIGH (ref 0.0–149.0)
VLDL: 58 mg/dL — ABNORMAL HIGH (ref 0.0–40.0)

## 2018-11-28 LAB — CBC WITH DIFFERENTIAL/PLATELET
Basophils Absolute: 0.1 10*3/uL (ref 0.0–0.1)
Basophils Relative: 0.9 % (ref 0.0–3.0)
Eosinophils Absolute: 0.1 10*3/uL (ref 0.0–0.7)
Eosinophils Relative: 0.8 % (ref 0.0–5.0)
HCT: 44.7 % (ref 36.0–46.0)
Hemoglobin: 15.4 g/dL — ABNORMAL HIGH (ref 12.0–15.0)
LYMPHS PCT: 29.6 % (ref 12.0–46.0)
Lymphs Abs: 2.8 10*3/uL (ref 0.7–4.0)
MCHC: 34.6 g/dL (ref 30.0–36.0)
MCV: 83.9 fl (ref 78.0–100.0)
Monocytes Absolute: 0.7 10*3/uL (ref 0.1–1.0)
Monocytes Relative: 7.7 % (ref 3.0–12.0)
Neutro Abs: 5.8 10*3/uL (ref 1.4–7.7)
Neutrophils Relative %: 61 % (ref 43.0–77.0)
Platelets: 250 10*3/uL (ref 150.0–400.0)
RBC: 5.32 Mil/uL — ABNORMAL HIGH (ref 3.87–5.11)
RDW: 13.6 % (ref 11.5–15.5)
WBC: 9.6 10*3/uL (ref 4.0–10.5)

## 2018-11-28 LAB — BASIC METABOLIC PANEL
BUN: 16 mg/dL (ref 6–23)
CO2: 28 mEq/L (ref 19–32)
CREATININE: 0.89 mg/dL (ref 0.40–1.20)
Calcium: 10 mg/dL (ref 8.4–10.5)
Chloride: 101 mEq/L (ref 96–112)
GFR: 63.31 mL/min (ref >60.00)
Glucose, Bld: 98 mg/dL (ref 70–99)
Potassium: 4 mEq/L (ref 3.5–5.1)
Sodium: 138 mEq/L (ref 135–145)

## 2018-11-28 LAB — LDL CHOLESTEROL, DIRECT: Direct LDL: 176 mg/dL

## 2018-11-28 LAB — HEPATIC FUNCTION PANEL
ALK PHOS: 89 U/L (ref 39–117)
ALT: 70 U/L — ABNORMAL HIGH (ref 0–35)
AST: 43 U/L — ABNORMAL HIGH (ref 0–37)
Albumin: 4.5 g/dL (ref 3.5–5.2)
BILIRUBIN DIRECT: 0.1 mg/dL (ref 0.0–0.3)
Total Bilirubin: 0.9 mg/dL (ref 0.2–1.2)
Total Protein: 7.6 g/dL (ref 6.0–8.3)

## 2018-11-28 LAB — VITAMIN B12: VITAMIN B 12: 368 pg/mL (ref 211–911)

## 2018-11-28 LAB — TSH: TSH: 3.14 u[IU]/mL (ref 0.35–4.50)

## 2018-11-28 MED ORDER — TRIAMCINOLONE ACETONIDE 0.5 % EX CREA: 1.0000 "application " | TOPICAL_CREAM | Freq: Four times a day (QID) | CUTANEOUS | 1 refills | Status: AC

## 2018-11-28 MED ORDER — CEPHALEXIN 500 MG PO CAPS: 500.0000 mg | ORAL_CAPSULE | Freq: Two times a day (BID) | ORAL | 2 refills | Status: DC

## 2018-11-28 NOTE — Assessment & Plan Note (Signed)
Risks associated with treatment noncompliance were discussed. Compliance was encouraged. 

## 2018-11-28 NOTE — Assessment & Plan Note (Signed)
Triamc Rx 

## 2018-11-28 NOTE — Assessment & Plan Note (Signed)
cardiac CT scan for calcium scoring offered again On Lipitor

## 2018-11-28 NOTE — Assessment & Plan Note (Signed)
CBC

## 2018-11-28 NOTE — Progress Notes (Signed)
Subjective:  Patient ID: Kayla Aguirre, female    DOB: 1951-02-18  Age: 68 y.o. MRN: [id number]  CC: No chief complaint on file.   HPI [name] presents for HTN, dyslipidemia, B12 def f/u  Outpatient Medications Prior to Visit  Medication Sig Dispense Refill  . amLODipine-valsartan (EXFORGE) 10-160 MG tablet Take 1 tablet by mouth daily. 90 tablet 3  . atorvastatin (LIPITOR) 10 MG tablet Take 1 tablet (10 mg total) by mouth daily. 90 tablet 3  . cephALEXin (KEFLEX) 500 MG capsule Take 1 capsule (500 mg total) by mouth 2 (two) times daily. Use prn bladder infection 6 capsule 1  . Cholecalciferol (VITAMIN D3) 50000 units CAPS Take 1 capsule by mouth every 30 (thirty) days. 3 capsule 3  . Cyanocobalamin (VITAMIN B-12) 1000 MCG SUBL Place 1 tablet (1,000 mcg total) under the tongue daily. 30 tablet 3  . diclofenac sodium (VOLTAREN) 1 % GEL Apply 2 g topically 4 (four) times daily. 100 g 3  . hydrochlorothiazide (HYDRODIURIL) 25 MG tablet Take 1 tablet (25 mg total) by mouth daily. 90 tablet 3  . triamcinolone cream (KENALOG) 0.5 % Apply 1 application topically 4 (four) times daily. 60 g 1  . loratadine (CLARITIN) 10 MG tablet Take 1 tablet (10 mg total) by mouth daily. 100 tablet 3   No facility-administered medications prior to visit.     ROS: Review of Systems  Constitutional: Negative for activity change, appetite change, chills, fatigue and unexpected weight change.  HENT: Negative for congestion, mouth sores and sinus pressure.   Eyes: Negative for visual disturbance.  Respiratory: Negative for cough and chest tightness.   Gastrointestinal: Negative for abdominal pain and nausea.  Genitourinary: Negative for difficulty urinating, frequency and vaginal pain.  Musculoskeletal: Negative for back pain and gait problem.  Skin: Negative for pallor and rash.  Neurological: Negative for dizziness, tremors, weakness, numbness and headaches.  Psychiatric/Behavioral: Negative for  confusion and sleep disturbance.    Objective:  BP 128/82 (BP Location: Left Arm, Patient Position: Sitting, Cuff Size: Large)   Pulse 84   Temp 98 F (36.7 C) (Oral)   Ht 5\' 5"  (1.651 m)   Wt 198 lb (89.8 kg)   SpO2 95%   BMI 32.95 kg/m   BP Readings from Last 3 Encounters:  11/28/18 128/82  08/29/18 124/82  04/26/18 128/84    Wt Readings from Last 3 Encounters:  11/28/18 198 lb (89.8 kg)  08/29/18 192 lb (87.1 kg)  04/26/18 192 lb (87.1 kg)    Physical Exam Constitutional:      General: She is not in acute distress.    Appearance: She is well-developed.  HENT:     Head: Normocephalic.     Right Ear: External ear normal.     Left Ear: External ear normal.     Nose: Nose normal.  Eyes:     General:        Right eye: No discharge.        Left eye: No discharge.     Conjunctiva/sclera: Conjunctivae normal.     Pupils: Pupils are equal, round, and reactive to light.  Neck:     Musculoskeletal: Normal range of motion and neck supple.     Thyroid: No thyromegaly.     Vascular: No JVD.     Trachea: No tracheal deviation.  Cardiovascular:     Rate and Rhythm: Normal rate and regular rhythm.     Heart sounds: Normal heart sounds.  Pulmonary:  Effort: No respiratory distress.     Breath sounds: No stridor. No wheezing.  Abdominal:     General: Bowel sounds are normal. There is no distension.     Palpations: Abdomen is soft. There is no mass.     Tenderness: There is no abdominal tenderness. There is no guarding or rebound.  Musculoskeletal:        General: No tenderness.  Lymphadenopathy:     Cervical: No cervical adenopathy.  Skin:    Findings: No erythema or rash.  Neurological:     Cranial Nerves: No cranial nerve deficit.     Motor: No abnormal muscle tone.     Coordination: Coordination normal.     Deep Tendon Reflexes: Reflexes normal.  Psychiatric:        Behavior: Behavior normal.        Thought Content: Thought content normal.        Judgment:  Judgment normal.   obese  Lab Results  Component Value Date   WBC 7.8 08/29/2018   HGB 15.4 (H) 08/29/2018   HCT 44.8 08/29/2018   PLT 271.0 08/29/2018   GLUCOSE 138 (H) 08/29/2018   CHOL 280 (H) 04/10/2018   TRIG 205.0 (H) 04/10/2018   HDL 49.20 04/10/2018   LDLDIRECT 193.0 04/10/2018   LDLCALC 172 (H) 10/24/2017   ALT 53 (H) 08/29/2018   AST 38 (H) 08/29/2018   NA 141 08/29/2018   K 4.0 08/29/2018   CL 103 08/29/2018   CREATININE 0.93 08/29/2018   BUN 15 08/29/2018   CO2 30 08/29/2018   TSH 4.17 08/29/2018   HGBA1C 5.7 07/16/2015    US Abdomen Complete  Result Date: 07/22/2014 CLINICAL DATA:  Elevated LFTs EXAM: ULTRASOUND ABDOMEN COMPLETE COMPARISON:  09/14/2011 FINDINGS: Gallbladder: No gallstones, gallbladder wall thickening, or pericholecystic fluid. 3 mm cholesterol polyp. Negative sonographic Murphy's sign. Common bile duct: Diameter: 2 mm Liver: Hyperechoic hepatic parenchyma, reflecting hepatic steatosis. IVC: No abnormality visualized. Pancreas: Visualized portion unremarkable. Spleen: Size and appearance within normal limits. Right Kidney: Length: 11.4 cm.  No mass or hydronephrosis. Left Kidney: Length: 10.6 cm.  No mass or hydronephrosis. Abdominal aorta: No aneurysm visualized. Other findings: None. IMPRESSION: Hepatic steatosis. Electronically Signed   By: Julian Hy M.D.   On: 07/22/2014 08:27    Assessment & Plan:   There are no diagnoses linked to this encounter.   No orders of the defined types were placed in this encounter.    Follow-up: No follow-ups on file.  Walker Kehr, MD

## 2018-11-28 NOTE — Assessment & Plan Note (Signed)
Chronic - not taking Rx  Risks associated with treatment noncompliance were discussed. Compliance was encouraged Labs

## 2018-11-28 NOTE — Assessment & Plan Note (Signed)
[facility]mpliance

## 2018-11-28 NOTE — Patient Instructions (Signed)

## 2019-02-28 ENCOUNTER — Ambulatory Visit (INDEPENDENT_AMBULATORY_CARE_PROVIDER_SITE_OTHER): Payer: Medicare HMO | Admitting: Internal Medicine

## 2019-02-28 ENCOUNTER — Encounter: Payer: Self-pay | Admitting: Internal Medicine

## 2019-02-28 ENCOUNTER — Other Ambulatory Visit: Payer: Self-pay

## 2019-02-28 DIAGNOSIS — R945 Abnormal results of liver function studies: Secondary | ICD-10-CM | POA: Diagnosis not present

## 2019-02-28 DIAGNOSIS — E559 Vitamin D deficiency, unspecified: Secondary | ICD-10-CM

## 2019-02-28 DIAGNOSIS — R7989 Other specified abnormal findings of blood chemistry: Secondary | ICD-10-CM

## 2019-02-28 DIAGNOSIS — E034 Atrophy of thyroid (acquired): Secondary | ICD-10-CM | POA: Diagnosis not present

## 2019-02-28 DIAGNOSIS — E538 Deficiency of other specified B group vitamins: Secondary | ICD-10-CM | POA: Diagnosis not present

## 2019-02-28 MED ORDER — CEPHALEXIN 500 MG PO CAPS: 500.0000 mg | ORAL_CAPSULE | Freq: Two times a day (BID) | ORAL | 3 refills | Status: DC

## 2019-02-28 MED ORDER — VITAMIN D3 50 MCG (2000 UT) PO CAPS: 2000.0000 [IU] | ORAL_CAPSULE | Freq: Every day | ORAL | 3 refills | Status: DC

## 2019-02-28 MED ORDER — HYDROCHLOROTHIAZIDE 12.5 MG PO CAPS: 12.5000 mg | ORAL_CAPSULE | Freq: Every day | ORAL | 3 refills | Status: DC

## 2019-02-28 MED ORDER — AMLODIPINE BESYLATE-VALSARTAN 10-160 MG PO TABS: 1.0000 | ORAL_TABLET | Freq: Every day | ORAL | 3 refills | Status: DC

## 2019-02-28 MED ORDER — VITAMIN B-12 1000 MCG SL SUBL: 1.0000 | SUBLINGUAL_TABLET | Freq: Every day | SUBLINGUAL | 3 refills | Status: DC

## 2019-02-28 NOTE — Assessment & Plan Note (Signed)
Labs

## 2019-02-28 NOTE — Progress Notes (Signed)
Subjective:  Patient ID: Kayla Aguirre, female    DOB: 01/25/1951  Age: 68 y.o. MRN: [id number]  CC: No chief complaint on file.   HPI Kayla Aguirre presents for Vit D and Vit B12 def, dyslipidemia, HTN f/u  Outpatient Medications Prior to Visit  Medication Sig Dispense Refill  . amLODipine-valsartan (EXFORGE) 10-160 MG tablet Take 1 tablet by mouth daily. 90 tablet 3  . atorvastatin (LIPITOR) 10 MG tablet Take 1 tablet (10 mg total) by mouth daily. 90 tablet 3  . cephALEXin (KEFLEX) 500 MG capsule Take 1 capsule (500 mg total) by mouth 2 (two) times daily. Use prn bladder infection 6 capsule 2  . Cholecalciferol (VITAMIN D3) 50000 units CAPS Take 1 capsule by mouth every 30 (thirty) days. 3 capsule 3  . Cyanocobalamin (VITAMIN B-12) 1000 MCG SUBL Place 1 tablet (1,000 mcg total) under the tongue daily. 30 tablet 3  . diclofenac sodium (VOLTAREN) 1 % GEL Apply 2 g topically 4 (four) times daily. 100 g 3  . hydrochlorothiazide (HYDRODIURIL) 25 MG tablet Take 1 tablet (25 mg total) by mouth daily. 90 tablet 3  . triamcinolone cream (KENALOG) 0.5 % Apply 1 application topically 4 (four) times daily. 60 g 1  . loratadine (CLARITIN) 10 MG tablet Take 1 tablet (10 mg total) by mouth daily. 100 tablet 3   No facility-administered medications prior to visit.     ROS: Review of Systems  Constitutional: Negative for activity change, appetite change, chills, fatigue and unexpected weight change.  HENT: Negative for congestion, mouth sores and sinus pressure.   Eyes: Negative for visual disturbance.  Respiratory: Negative for cough and chest tightness.   Gastrointestinal: Negative for abdominal pain and nausea.  Genitourinary: Negative for difficulty urinating, frequency and vaginal pain.  Musculoskeletal: Negative for back pain and gait problem.  Skin: Negative for pallor and rash.  Neurological: Negative for dizziness, tremors, weakness, numbness and headaches.  Psychiatric/Behavioral:  Negative for confusion and sleep disturbance.    Objective:  BP 130/84 (BP Location: Left Arm, Patient Position: Sitting, Cuff Size: Normal)   Pulse 85   Temp 98.2 F (36.8 C) (Oral)   Ht 5\' 5"  (1.651 m)   Wt 193 lb (87.5 kg)   SpO2 96%   BMI 32.12 kg/m   BP Readings from Last 3 Encounters:  02/28/19 130/84  11/28/18 128/82  08/29/18 124/82    Wt Readings from Last 3 Encounters:  02/28/19 193 lb (87.5 kg)  11/28/18 198 lb (89.8 kg)  08/29/18 192 lb (87.1 kg)    Physical Exam Constitutional:      General: She is not in acute distress.    Appearance: She is well-developed.  HENT:     Head: Normocephalic.     Right Ear: External ear normal.     Left Ear: External ear normal.     Nose: Nose normal.  Eyes:     General:        Right eye: No discharge.        Left eye: No discharge.     Conjunctiva/sclera: Conjunctivae normal.     Pupils: Pupils are equal, round, and reactive to light.  Neck:     Musculoskeletal: Normal range of motion and neck supple.     Thyroid: No thyromegaly.     Vascular: No JVD.     Trachea: No tracheal deviation.  Cardiovascular:     Rate and Rhythm: Normal rate and regular rhythm.     Heart sounds: Normal heart  sounds.  Pulmonary:     Effort: No respiratory distress.     Breath sounds: No stridor. No wheezing.  Abdominal:     General: Bowel sounds are normal. There is no distension.     Palpations: Abdomen is soft. There is no mass.     Tenderness: There is no abdominal tenderness. There is no guarding or rebound.  Musculoskeletal:        General: No tenderness.  Lymphadenopathy:     Cervical: No cervical adenopathy.  Skin:    Findings: No erythema or rash.  Neurological:     Mental Status: She is oriented to person, place, and time.     Cranial Nerves: No cranial nerve deficit.     Motor: No abnormal muscle tone.     Coordination: Coordination normal.     Deep Tendon Reflexes: Reflexes normal.  Psychiatric:        Behavior:  Behavior normal.        Thought Content: Thought content normal.        Judgment: Judgment normal.     Lab Results  Component Value Date   WBC 9.6 11/28/2018   HGB 15.4 (H) 11/28/2018   HCT 44.7 11/28/2018   PLT 250.0 11/28/2018   GLUCOSE 98 11/28/2018   CHOL 271 (H) 11/28/2018   TRIG 290.0 (H) 11/28/2018   HDL 54.10 11/28/2018   LDLDIRECT 176.0 11/28/2018   LDLCALC 172 (H) 10/24/2017   ALT 70 (H) 11/28/2018   AST 43 (H) 11/28/2018   NA 138 11/28/2018   K 4.0 11/28/2018   CL 101 11/28/2018   CREATININE 0.89 11/28/2018   BUN 16 11/28/2018   CO2 28 11/28/2018   TSH 3.14 11/28/2018   HGBA1C 5.7 07/16/2015    US Abdomen Complete  Result Date: 07/22/2014 CLINICAL DATA:  Elevated LFTs EXAM: ULTRASOUND ABDOMEN COMPLETE COMPARISON:  09/14/2011 FINDINGS: Gallbladder: No gallstones, gallbladder wall thickening, or pericholecystic fluid. 3 mm cholesterol polyp. Negative sonographic Murphy's sign. Common bile duct: Diameter: 2 mm Liver: Hyperechoic hepatic parenchyma, reflecting hepatic steatosis. IVC: No abnormality visualized. Pancreas: Visualized portion unremarkable. Spleen: Size and appearance within normal limits. Right Kidney: Length: 11.4 cm.  No mass or hydronephrosis. Left Kidney: Length: 10.6 cm.  No mass or hydronephrosis. Abdominal aorta: No aneurysm visualized. Other findings: None. IMPRESSION: Hepatic steatosis. Electronically Signed   By: Julian Hy M.D.   On: 07/22/2014 08:27    Assessment & Plan:   There are no diagnoses linked to this encounter.   No orders of the defined types were placed in this encounter.    Follow-up: No follow-ups on file.  Walker Kehr, MD

## 2019-02-28 NOTE — Assessment & Plan Note (Addendum)
On B12 - re-start 

## 2019-02-28 NOTE — Assessment & Plan Note (Signed)
She did not take Rx Vit D - she was constipated on Vit D 500000 u Risks associated with treatment noncompliance were discussed. Compliance was encouraged.

## 2019-07-01 ENCOUNTER — Ambulatory Visit (INDEPENDENT_AMBULATORY_CARE_PROVIDER_SITE_OTHER): Payer: Medicare HMO | Admitting: Internal Medicine

## 2019-07-01 ENCOUNTER — Encounter: Payer: Self-pay | Admitting: Internal Medicine

## 2019-07-01 ENCOUNTER — Other Ambulatory Visit: Payer: Self-pay

## 2019-07-01 DIAGNOSIS — I10 Essential (primary) hypertension: Secondary | ICD-10-CM

## 2019-07-01 DIAGNOSIS — R7989 Other specified abnormal findings of blood chemistry: Secondary | ICD-10-CM

## 2019-07-01 DIAGNOSIS — K760 Fatty (change of) liver, not elsewhere classified: Secondary | ICD-10-CM | POA: Diagnosis not present

## 2019-07-01 DIAGNOSIS — E785 Hyperlipidemia, unspecified: Secondary | ICD-10-CM

## 2019-07-01 DIAGNOSIS — N3090 Cystitis, unspecified without hematuria: Secondary | ICD-10-CM | POA: Insufficient documentation

## 2019-07-01 DIAGNOSIS — E538 Deficiency of other specified B group vitamins: Secondary | ICD-10-CM

## 2019-07-01 DIAGNOSIS — D751 Secondary polycythemia: Secondary | ICD-10-CM

## 2019-07-01 DIAGNOSIS — E559 Vitamin D deficiency, unspecified: Secondary | ICD-10-CM

## 2019-07-01 MED ORDER — HYDROCHLOROTHIAZIDE 25 MG PO TABS: 25.0000 mg | ORAL_TABLET | Freq: Every day | ORAL | 3 refills | Status: DC

## 2019-07-01 MED ORDER — CEPHALEXIN 500 MG PO CAPS: 500.0000 mg | ORAL_CAPSULE | Freq: Two times a day (BID) | ORAL | 1 refills | Status: DC

## 2019-07-01 MED ORDER — AMLODIPINE BESYLATE-VALSARTAN 10-160 MG PO TABS: 1.0000 | ORAL_TABLET | Freq: Every day | ORAL | 3 refills | Status: DC

## 2019-07-01 NOTE — Assessment & Plan Note (Signed)
Lipitor Risks associated with treatment noncompliance were discussed. Compliance was encouraged. 

## 2019-07-01 NOTE — Progress Notes (Signed)
Subjective:  Patient ID: Kayla Aguirre, female    DOB: [birth year]  Age: 68 y.o. MRN: [id number]  CC: No chief complaint on file.   HPI Kayla Aguirre presents for HTN, B12 def, dyslipidemia f/u  Outpatient Medications Prior to Visit  Medication Sig Dispense Refill  . amLODipine-valsartan (EXFORGE) 10-160 MG tablet Take 1 tablet by mouth daily. 90 tablet 3  . atorvastatin (LIPITOR) 10 MG tablet Take 1 tablet (10 mg total) by mouth daily. 90 tablet 3  . cephALEXin (KEFLEX) 500 MG capsule Take 1 capsule (500 mg total) by mouth 2 (two) times daily. Use prn bladder infection 6 capsule 3  . Cholecalciferol (VITAMIN D3) 50 MCG (2000 UT) capsule Take 1 capsule (2,000 Units total) by mouth daily. 100 capsule 3  . Cyanocobalamin (VITAMIN B-12) 1000 MCG SUBL Place 1 tablet (1,000 mcg total) under the tongue daily. 90 tablet 3  . diclofenac sodium (VOLTAREN) 1 % GEL Apply 2 g topically 4 (four) times daily. 100 g 3  . hydrochlorothiazide (MICROZIDE) 12.5 MG capsule Take 1 capsule (12.5 mg total) by mouth daily. 90 capsule 3  . triamcinolone cream (KENALOG) 0.5 % Apply 1 application topically 4 (four) times daily. 60 g 1  . loratadine (CLARITIN) 10 MG tablet Take 1 tablet (10 mg total) by mouth daily. 100 tablet 3   No facility-administered medications prior to visit.     ROS: Review of Systems  Constitutional: Positive for unexpected weight change. Negative for activity change, appetite change, chills and fatigue.  HENT: Negative for congestion, mouth sores and sinus pressure.   Eyes: Negative for visual disturbance.  Respiratory: Negative for cough and chest tightness.   Gastrointestinal: Negative for abdominal pain and nausea.  Genitourinary: Negative for difficulty urinating, frequency and vaginal pain.  Musculoskeletal: Negative for back pain and gait problem.  Skin: Negative for pallor and rash.  Neurological: Negative for dizziness, tremors, weakness, numbness and headaches.   Psychiatric/Behavioral: Negative for confusion, sleep disturbance and suicidal ideas.    Objective:  BP 132/86 (BP Location: Left Arm, Patient Position: Sitting, Cuff Size: Normal)   Pulse (!) 110   Temp 98.4 F (36.9 C) (Oral)   Ht 5\' 5"  (1.651 m)   Wt 195 lb (88.5 kg)   SpO2 94%   BMI 32.45 kg/m   BP Readings from Last 3 Encounters:  07/01/19 132/86  02/28/19 130/84  11/28/18 128/82    Wt Readings from Last 3 Encounters:  07/01/19 195 lb (88.5 kg)  02/28/19 193 lb (87.5 kg)  11/28/18 198 lb (89.8 kg)    Physical Exam Constitutional:      General: She is not in acute distress.    Appearance: She is well-developed.  HENT:     Head: Normocephalic.     Right Ear: External ear normal.     Left Ear: External ear normal.     Nose: Nose normal.  Eyes:     General:        Right eye: No discharge.        Left eye: No discharge.     Conjunctiva/sclera: Conjunctivae normal.     Pupils: Pupils are equal, round, and reactive to light.  Neck:     Musculoskeletal: Normal range of motion and neck supple.     Thyroid: No thyromegaly.     Vascular: No JVD.     Trachea: No tracheal deviation.  Cardiovascular:     Rate and Rhythm: Normal rate and regular rhythm.     Heart  sounds: Normal heart sounds.  Pulmonary:     Effort: No respiratory distress.     Breath sounds: No stridor. No wheezing.  Abdominal:     General: Bowel sounds are normal. There is no distension.     Palpations: Abdomen is soft. There is no mass.     Tenderness: There is no abdominal tenderness. There is no guarding or rebound.  Musculoskeletal:        General: No tenderness.  Lymphadenopathy:     Cervical: No cervical adenopathy.  Skin:    Findings: No erythema or rash.  Neurological:     Cranial Nerves: No cranial nerve deficit.     Motor: No abnormal muscle tone.     Coordination: Coordination normal.     Deep Tendon Reflexes: Reflexes normal.  Psychiatric:        Behavior: Behavior normal.         Thought Content: Thought content normal.        Judgment: Judgment normal.     Lab Results  Component Value Date   WBC 9.6 11/28/2018   HGB 15.4 (H) 11/28/2018   HCT 44.7 11/28/2018   PLT 250.0 11/28/2018   GLUCOSE 98 11/28/2018   CHOL 271 (H) 11/28/2018   TRIG 290.0 (H) 11/28/2018   HDL 54.10 11/28/2018   LDLDIRECT 176.0 11/28/2018   LDLCALC 172 (H) 10/24/2017   ALT 70 (H) 11/28/2018   AST 43 (H) 11/28/2018   NA 138 11/28/2018   K 4.0 11/28/2018   CL 101 11/28/2018   CREATININE 0.89 11/28/2018   BUN 16 11/28/2018   CO2 28 11/28/2018   TSH 3.14 11/28/2018   HGBA1C 5.7 07/16/2015    US Abdomen Complete  Result Date: 07/22/2014 CLINICAL DATA:  Elevated LFTs EXAM: ULTRASOUND ABDOMEN COMPLETE COMPARISON:  09/14/2011 FINDINGS: Gallbladder: No gallstones, gallbladder wall thickening, or pericholecystic fluid. 3 mm cholesterol polyp. Negative sonographic Murphy's sign. Common bile duct: Diameter: 2 mm Liver: Hyperechoic hepatic parenchyma, reflecting hepatic steatosis. IVC: No abnormality visualized. Pancreas: Visualized portion unremarkable. Spleen: Size and appearance within normal limits. Right Kidney: Length: 11.4 cm.  No mass or hydronephrosis. Left Kidney: Length: 10.6 cm.  No mass or hydronephrosis. Abdominal aorta: No aneurysm visualized. Other findings: None. IMPRESSION: Hepatic steatosis. Electronically Signed   By: Julian Hy M.D.   On: 07/22/2014 08:27    Assessment & Plan:   There are no diagnoses linked to this encounter.   No orders of the defined types were placed in this encounter.    Follow-up: No follow-ups on file.  Walker Kehr, MD

## 2019-07-01 NOTE — Patient Instructions (Signed)

## 2019-07-01 NOTE — Assessment & Plan Note (Signed)
Pt declined [place] Labs

## 2019-07-01 NOTE — Assessment & Plan Note (Signed)
Vit D 

## 2019-07-01 NOTE — Assessment & Plan Note (Signed)
LFTs Pt declined [place]

## 2019-07-01 NOTE — Assessment & Plan Note (Signed)
Recurrent Keflex prn 

## 2019-07-01 NOTE — Assessment & Plan Note (Signed)
Exforge 

## 2019-07-01 NOTE — Assessment & Plan Note (Signed)
CBC

## 2019-07-01 NOTE — Assessment & Plan Note (Signed)
Risks associated with treatment noncompliance were discussed. Compliance was encouraged. 

## 2019-07-04 ENCOUNTER — Other Ambulatory Visit (INDEPENDENT_AMBULATORY_CARE_PROVIDER_SITE_OTHER): Payer: Medicare HMO

## 2019-07-04 DIAGNOSIS — K760 Fatty (change of) liver, not elsewhere classified: Secondary | ICD-10-CM | POA: Diagnosis not present

## 2019-07-04 DIAGNOSIS — E538 Deficiency of other specified B group vitamins: Secondary | ICD-10-CM

## 2019-07-04 DIAGNOSIS — E785 Hyperlipidemia, unspecified: Secondary | ICD-10-CM | POA: Diagnosis not present

## 2019-07-04 DIAGNOSIS — I10 Essential (primary) hypertension: Secondary | ICD-10-CM | POA: Diagnosis not present

## 2019-07-04 DIAGNOSIS — E559 Vitamin D deficiency, unspecified: Secondary | ICD-10-CM

## 2019-07-04 LAB — URINALYSIS, ROUTINE W REFLEX MICROSCOPIC
Bilirubin Urine: NEGATIVE
Hgb urine dipstick: NEGATIVE
Ketones, ur: NEGATIVE
Nitrite: NEGATIVE
RBC / HPF: NONE SEEN (ref 0–?)
Specific Gravity, Urine: >=1.03 — AB (ref 1.000–1.030)
Total Protein, Urine: NEGATIVE
Urine Glucose: NEGATIVE
Urobilinogen, UA: 0.2 (ref 0.0–1.0)
pH: 5 (ref 5.0–8.0)

## 2019-07-04 LAB — HEPATIC FUNCTION PANEL
ALT: 58 U/L — ABNORMAL HIGH (ref 0–35)
AST: 36 U/L (ref 0–37)
Albumin: 4.1 g/dL (ref 3.5–5.2)
Alkaline Phosphatase: 83 U/L (ref 39–117)
Bilirubin, Direct: 0.1 mg/dL (ref 0.0–0.3)
Total Bilirubin: 1 mg/dL (ref 0.2–1.2)
Total Protein: 7.1 g/dL (ref 6.0–8.3)

## 2019-07-04 LAB — CBC WITH DIFFERENTIAL/PLATELET
Basophils Absolute: 0 10*3/uL (ref 0.0–0.1)
Basophils Relative: 0.6 % (ref 0.0–3.0)
Eosinophils Absolute: 0.1 10*3/uL (ref 0.0–0.7)
Eosinophils Relative: 1.4 % (ref 0.0–5.0)
HCT: 42.8 % (ref 36.0–46.0)
Hemoglobin: 14.8 g/dL (ref 12.0–15.0)
Lymphocytes Relative: 41 % (ref 12.0–46.0)
Lymphs Abs: 2.8 10*3/uL (ref 0.7–4.0)
MCHC: 34.6 g/dL (ref 30.0–36.0)
MCV: 83.8 fl (ref 78.0–100.0)
Monocytes Absolute: 0.5 10*3/uL (ref 0.1–1.0)
Monocytes Relative: 7.6 % (ref 3.0–12.0)
Neutro Abs: 3.4 10*3/uL (ref 1.4–7.7)
Neutrophils Relative %: 49.4 % (ref 43.0–77.0)
Platelets: 243 10*3/uL (ref 150.0–400.0)
RBC: 5.11 Mil/uL (ref 3.87–5.11)
RDW: 13.8 % (ref 11.5–15.5)
WBC: 6.9 10*3/uL (ref 4.0–10.5)

## 2019-07-04 LAB — LIPID PANEL
Cholesterol: 256 mg/dL — ABNORMAL HIGH (ref 0–200)
HDL: 47.3 mg/dL (ref >39.00)
NonHDL: 209.01
Total CHOL/HDL Ratio: 5
Triglycerides: 292 mg/dL — ABNORMAL HIGH (ref 0.0–149.0)
VLDL: 58.4 mg/dL — ABNORMAL HIGH (ref 0.0–40.0)

## 2019-07-04 LAB — BASIC METABOLIC PANEL
BUN: 15 mg/dL (ref 6–23)
CO2: 27 mEq/L (ref 19–32)
Calcium: 9.4 mg/dL (ref 8.4–10.5)
Chloride: 106 mEq/L (ref 96–112)
Creatinine, Ser: 0.89 mg/dL (ref 0.40–1.20)
GFR: 63.2 mL/min (ref >60.00)
Glucose, Bld: 124 mg/dL — ABNORMAL HIGH (ref 70–99)
Potassium: 4.1 mEq/L (ref 3.5–5.1)
Sodium: 140 mEq/L (ref 135–145)

## 2019-07-04 LAB — TSH: TSH: 4.08 u[IU]/mL (ref 0.35–4.50)

## 2019-07-04 LAB — LDL CHOLESTEROL, DIRECT: Direct LDL: 157 mg/dL

## 2019-07-04 LAB — VITAMIN D 25 HYDROXY (VIT D DEFICIENCY, FRACTURES): VITD: 14.63 ng/mL — ABNORMAL LOW (ref 30.00–100.00)

## 2019-07-04 LAB — VITAMIN B12: Vitamin B-12: 290 pg/mL (ref 211–911)

## 2019-07-05 ENCOUNTER — Other Ambulatory Visit: Payer: Self-pay | Admitting: Internal Medicine

## 2019-07-05 LAB — IRON,TIBC AND FERRITIN PANEL
%SAT: 33 % (calc) (ref 16–45)
Ferritin: 130 ng/mL (ref 16–288)
Iron: 112 ug/dL (ref 45–160)
TIBC: 338 mcg/dL (calc) (ref 250–450)

## 2019-07-05 MED ORDER — VITAMIN D3 1.25 MG (50000 UT) PO CAPS: 1.0000 | ORAL_CAPSULE | ORAL | 0 refills | Status: DC

## 2019-10-01 ENCOUNTER — Ambulatory Visit (INDEPENDENT_AMBULATORY_CARE_PROVIDER_SITE_OTHER): Payer: Medicare HMO | Admitting: Internal Medicine

## 2019-10-01 ENCOUNTER — Other Ambulatory Visit: Payer: Self-pay

## 2019-10-01 ENCOUNTER — Telehealth: Payer: Self-pay

## 2019-10-01 ENCOUNTER — Encounter: Payer: Self-pay | Admitting: Internal Medicine

## 2019-10-01 DIAGNOSIS — D751 Secondary polycythemia: Secondary | ICD-10-CM | POA: Diagnosis not present

## 2019-10-01 DIAGNOSIS — E538 Deficiency of other specified B group vitamins: Secondary | ICD-10-CM | POA: Diagnosis not present

## 2019-10-01 DIAGNOSIS — I10 Essential (primary) hypertension: Secondary | ICD-10-CM

## 2019-10-01 DIAGNOSIS — D508 Other iron deficiency anemias: Secondary | ICD-10-CM

## 2019-10-01 DIAGNOSIS — E559 Vitamin D deficiency, unspecified: Secondary | ICD-10-CM | POA: Diagnosis not present

## 2019-10-01 MED ORDER — VITAMIN D3 50 MCG (2000 UT) PO CAPS: 2000.0000 [IU] | ORAL_CAPSULE | Freq: Every day | ORAL | 3 refills | Status: AC

## 2019-10-01 MED ORDER — CEPHALEXIN 500 MG PO CAPS: 500.0000 mg | ORAL_CAPSULE | Freq: Two times a day (BID) | ORAL | 1 refills | Status: DC

## 2019-10-01 MED ORDER — VITAMIN B-12 1000 MCG SL SUBL: 1.0000 | SUBLINGUAL_TABLET | Freq: Every day | SUBLINGUAL | 3 refills | Status: AC

## 2019-10-01 NOTE — Assessment & Plan Note (Signed)
CBC

## 2019-10-01 NOTE — Patient Instructions (Signed)

## 2019-10-01 NOTE — Assessment & Plan Note (Signed)
Vit D 

## 2019-10-01 NOTE — Progress Notes (Signed)
Subjective:  Patient ID: Kayla Aguirre, female    DOB: 08/15/1951  Age: 69 y.o. MRN: [id number]  CC: No chief complaint on file.   HPI Kayla Aguirre presents for HTN, B12 def, Vit D def f/u C/o cough and weakness 2 weeks  Outpatient Medications Prior to Visit  Medication Sig Dispense Refill  . amLODipine-valsartan (EXFORGE) 10-160 MG tablet Take 1 tablet by mouth daily. 90 tablet 3  . atorvastatin (LIPITOR) 10 MG tablet Take 1 tablet (10 mg total) by mouth daily. 90 tablet 3  . cephALEXin (KEFLEX) 500 MG capsule Take 1 capsule (500 mg total) by mouth 2 (two) times daily. Use prn bladder infection 30 capsule 1  . Cholecalciferol (VITAMIN D3) 1.25 MG (50000 UT) CAPS Take 1 capsule by mouth once a week. 12 capsule 0  . Cholecalciferol (VITAMIN D3) 50 MCG (2000 UT) capsule Take 1 capsule (2,000 Units total) by mouth daily. 100 capsule 3  . Cyanocobalamin (VITAMIN B-12) 1000 MCG SUBL Place 1 tablet (1,000 mcg total) under the tongue daily. 90 tablet 3  . diclofenac sodium (VOLTAREN) 1 % GEL Apply 2 g topically 4 (four) times daily. 100 g 3  . hydrochlorothiazide (HYDRODIURIL) 25 MG tablet Take 1 tablet (25 mg total) by mouth daily. 90 tablet 3  . triamcinolone cream (KENALOG) 0.5 % Apply 1 application topically 4 (four) times daily. 60 g 1  . loratadine (CLARITIN) 10 MG tablet Take 1 tablet (10 mg total) by mouth daily. 100 tablet 3   No facility-administered medications prior to visit.    ROS: Review of Systems  Constitutional: Positive for unexpected weight change. Negative for activity change, appetite change, chills and fatigue.  HENT: Negative for congestion, mouth sores and sinus pressure.   Eyes: Negative for visual disturbance.  Respiratory: Negative for cough and chest tightness.   Gastrointestinal: Negative for abdominal pain and nausea.  Genitourinary: Negative for difficulty urinating, frequency and vaginal pain.  Musculoskeletal: Negative for back pain and gait  problem.  Skin: Negative for pallor and rash.  Neurological: Negative for dizziness, tremors, weakness, numbness and headaches.  Psychiatric/Behavioral: Negative for confusion and sleep disturbance.    Objective:  BP (!) 156/92 (BP Location: Left Arm, Patient Position: Sitting, Cuff Size: Large)   Pulse 100   Temp 99.2 F (37.3 C) (Oral)   Ht 5\' 5"  (1.651 m)   Wt 197 lb (89.4 kg)   SpO2 96%   BMI 32.78 kg/m   BP Readings from Last 3 Encounters:  10/01/19 (!) 156/92  07/01/19 132/86  02/28/19 130/84    Wt Readings from Last 3 Encounters:  10/01/19 197 lb (89.4 kg)  07/01/19 195 lb (88.5 kg)  02/28/19 193 lb (87.5 kg)    Physical Exam Constitutional:      General: She is not in acute distress.    Appearance: She is well-developed. She is obese.  HENT:     Head: Normocephalic.     Right Ear: External ear normal.     Left Ear: External ear normal.     Nose: Nose normal.  Eyes:     General:        Right eye: No discharge.        Left eye: No discharge.     Conjunctiva/sclera: Conjunctivae normal.     Pupils: Pupils are equal, round, and reactive to light.  Neck:     Thyroid: No thyromegaly.     Vascular: No JVD.     Trachea: No tracheal deviation.  Cardiovascular:     Rate and Rhythm: Normal rate and regular rhythm.     Heart sounds: Normal heart sounds.  Pulmonary:     Effort: No respiratory distress.     Breath sounds: No stridor. No wheezing.  Abdominal:     General: Bowel sounds are normal. There is no distension.     Palpations: Abdomen is soft. There is no mass.     Tenderness: There is no abdominal tenderness. There is no guarding or rebound.  Musculoskeletal:        General: No tenderness.     Cervical back: Normal range of motion and neck supple.  Lymphadenopathy:     Cervical: No cervical adenopathy.  Skin:    Findings: No erythema or rash.  Neurological:     Cranial Nerves: No cranial nerve deficit.     Motor: No abnormal muscle tone.      Coordination: Coordination normal.     Deep Tendon Reflexes: Reflexes normal.  Psychiatric:        Behavior: Behavior normal.        Thought Content: Thought content normal.        Judgment: Judgment normal.     Lab Results  Component Value Date   WBC 6.9 07/04/2019   HGB 14.8 07/04/2019   HCT 42.8 07/04/2019   PLT 243.0 07/04/2019   GLUCOSE 124 (H) 07/04/2019   CHOL 256 (H) 07/04/2019   TRIG 292.0 (H) 07/04/2019   HDL 47.30 07/04/2019   LDLDIRECT 157.0 07/04/2019   LDLCALC 172 (H) 10/24/2017   ALT 58 (H) 07/04/2019   AST 36 07/04/2019   NA 140 07/04/2019   K 4.1 07/04/2019   CL 106 07/04/2019   CREATININE 0.89 07/04/2019   BUN 15 07/04/2019   CO2 27 07/04/2019   TSH 4.08 07/04/2019   HGBA1C 5.7 07/16/2015    US Abdomen Complete  Result Date: 07/22/2014 CLINICAL DATA:  Elevated LFTs EXAM: ULTRASOUND ABDOMEN COMPLETE COMPARISON:  09/14/2011 FINDINGS: Gallbladder: No gallstones, gallbladder wall thickening, or pericholecystic fluid. 3 mm cholesterol polyp. Negative sonographic Murphy's sign. Common bile duct: Diameter: 2 mm Liver: Hyperechoic hepatic parenchyma, reflecting hepatic steatosis. IVC: No abnormality visualized. Pancreas: Visualized portion unremarkable. Spleen: Size and appearance within normal limits. Right Kidney: Length: 11.4 cm.  No mass or hydronephrosis. Left Kidney: Length: 10.6 cm.  No mass or hydronephrosis. Abdominal aorta: No aneurysm visualized. Other findings: None. IMPRESSION: Hepatic steatosis. Electronically Signed   By: Julian Hy M.D.   On: 07/22/2014 08:27    Assessment & Plan:   There are no diagnoses linked to this encounter.   No orders of the defined types were placed in this encounter.    Follow-up: No follow-ups on file.  Walker Kehr, MD

## 2019-10-01 NOTE — Assessment & Plan Note (Signed)
B 12

## 2019-10-01 NOTE — Telephone Encounter (Signed)
At check out, patient states that she forgot to ask him about a dentist that he would recommend. Patient very addiment about getting a name. Please advise.

## 2019-10-01 NOTE — Assessment & Plan Note (Signed)
[facility]

## 2019-10-02 NOTE — Telephone Encounter (Signed)
LMTCB

## 2019-10-02 NOTE — Telephone Encounter (Signed)
Please advise 

## 2019-10-02 NOTE — Telephone Encounter (Signed)
I go to [facility], [facility] - in [place] If she wants to go to a russian speaking dentist - see Dr Arnette Schaumann in [place]. Thx

## 2019-10-03 NOTE — Telephone Encounter (Signed)
Pt missed call yesterday and is wanting a call back at [phone]

## 2019-10-04 NOTE — Telephone Encounter (Signed)
Pt.notified

## 2020-01-01 ENCOUNTER — Encounter: Payer: Self-pay | Admitting: Internal Medicine

## 2020-01-01 ENCOUNTER — Ambulatory Visit (INDEPENDENT_AMBULATORY_CARE_PROVIDER_SITE_OTHER): Payer: Medicare HMO | Admitting: Internal Medicine

## 2020-01-01 ENCOUNTER — Other Ambulatory Visit: Payer: Self-pay

## 2020-01-01 DIAGNOSIS — I10 Essential (primary) hypertension: Secondary | ICD-10-CM

## 2020-01-01 DIAGNOSIS — E559 Vitamin D deficiency, unspecified: Secondary | ICD-10-CM

## 2020-01-01 DIAGNOSIS — E785 Hyperlipidemia, unspecified: Secondary | ICD-10-CM

## 2020-01-01 DIAGNOSIS — D751 Secondary polycythemia: Secondary | ICD-10-CM

## 2020-01-01 DIAGNOSIS — E034 Atrophy of thyroid (acquired): Secondary | ICD-10-CM | POA: Diagnosis not present

## 2020-01-01 DIAGNOSIS — E538 Deficiency of other specified B group vitamins: Secondary | ICD-10-CM | POA: Diagnosis not present

## 2020-01-01 DIAGNOSIS — K760 Fatty (change of) liver, not elsewhere classified: Secondary | ICD-10-CM | POA: Diagnosis not present

## 2020-01-01 MED ORDER — AMLODIPINE BESYLATE-VALSARTAN 10-160 MG PO TABS: 1.0000 | ORAL_TABLET | Freq: Every day | ORAL | 3 refills | Status: DC

## 2020-01-01 NOTE — Progress Notes (Signed)
Subjective:  Patient ID: Kayla Aguirre, female    DOB: 04/17/1951  Age: 69 y.o. MRN: [id number]  CC: No chief complaint on file.   HPI Kayla Aguirre presents for HTN, vitamin B12 deficiency, vitamin D deficiency, dyslipidemia, elevated glucose  Outpatient Medications Prior to Visit  Medication Sig Dispense Refill  . amLODipine-valsartan (EXFORGE) 10-160 MG tablet Take 1 tablet by mouth daily. 90 tablet 3  . atorvastatin (LIPITOR) 10 MG tablet Take 1 tablet (10 mg total) by mouth daily. 90 tablet 3  . cephALEXin (KEFLEX) 500 MG capsule Take 1 capsule (500 mg total) by mouth 2 (two) times daily. Use prn bladder infection 30 capsule 1  . Cholecalciferol (VITAMIN D3) 1.25 MG (50000 UT) CAPS Take 1 capsule by mouth once a week. 12 capsule 0  . Cholecalciferol (VITAMIN D3) 50 MCG (2000 UT) capsule Take 1 capsule (2,000 Units total) by mouth daily. 100 capsule 3  . Cyanocobalamin (VITAMIN B-12) 1000 MCG SUBL Place 1 tablet (1,000 mcg total) under the tongue daily. 100 tablet 3  . diclofenac sodium (VOLTAREN) 1 % GEL Apply 2 g topically 4 (four) times daily. 100 g 3  . hydrochlorothiazide (HYDRODIURIL) 25 MG tablet Take 1 tablet (25 mg total) by mouth daily. 90 tablet 3  . loratadine (CLARITIN) 10 MG tablet Take 1 tablet (10 mg total) by mouth daily. 100 tablet 3   No facility-administered medications prior to visit.    ROS: Review of Systems  Constitutional: Negative for activity change, appetite change, chills, fatigue and unexpected weight change.  HENT: Negative for congestion, mouth sores and sinus pressure.   Eyes: Negative for visual disturbance.  Respiratory: Negative for cough and chest tightness.   Gastrointestinal: Negative for abdominal pain and nausea.  Genitourinary: Negative for difficulty urinating, frequency and vaginal pain.  Musculoskeletal: Negative for back pain and gait problem.  Skin: Negative for pallor and rash.  Neurological: Negative for dizziness, tremors,  weakness, numbness and headaches.  Psychiatric/Behavioral: Negative for confusion and sleep disturbance.    Objective:  BP 128/82 (BP Location: Left Arm, Patient Position: Sitting, Cuff Size: Large)   Pulse 95   Temp 98.3 F (36.8 C) (Oral)   Ht 5\' 5"  (1.651 m)   Wt 198 lb (89.8 kg)   SpO2 96%   BMI 32.95 kg/m   BP Readings from Last 3 Encounters:  01/01/20 128/82  10/01/19 (!) 156/92  07/01/19 132/86    Wt Readings from Last 3 Encounters:  01/01/20 198 lb (89.8 kg)  10/01/19 197 lb (89.4 kg)  07/01/19 195 lb (88.5 kg)    Physical Exam Constitutional:      General: She is not in acute distress.    Appearance: She is well-developed. She is obese.  HENT:     Head: Normocephalic.     Right Ear: External ear normal.     Left Ear: External ear normal.     Nose: Nose normal.  Eyes:     General:        Right eye: No discharge.        Left eye: No discharge.     Conjunctiva/sclera: Conjunctivae normal.     Pupils: Pupils are equal, round, and reactive to light.  Neck:     Thyroid: No thyromegaly.     Vascular: No JVD.     Trachea: No tracheal deviation.  Cardiovascular:     Rate and Rhythm: Normal rate and regular rhythm.     Heart sounds: Normal heart sounds.  Pulmonary:  Effort: No respiratory distress.     Breath sounds: No stridor. No wheezing.  Abdominal:     General: Bowel sounds are normal. There is no distension.     Palpations: Abdomen is soft. There is no mass.     Tenderness: There is no abdominal tenderness. There is no guarding or rebound.  Musculoskeletal:        General: No tenderness.     Cervical back: Normal range of motion and neck supple.  Lymphadenopathy:     Cervical: No cervical adenopathy.  Skin:    Findings: No erythema or rash.  Neurological:     Cranial Nerves: No cranial nerve deficit.     Motor: No abnormal muscle tone.     Coordination: Coordination normal.     Deep Tendon Reflexes: Reflexes normal.  Psychiatric:         Behavior: Behavior normal.        Thought Content: Thought content normal.        Judgment: Judgment normal.     Lab Results  Component Value Date   WBC 6.9 07/04/2019   HGB 14.8 07/04/2019   HCT 42.8 07/04/2019   PLT 243.0 07/04/2019   GLUCOSE 124 (H) 07/04/2019   CHOL 256 (H) 07/04/2019   TRIG 292.0 (H) 07/04/2019   HDL 47.30 07/04/2019   LDLDIRECT 157.0 07/04/2019   LDLCALC 172 (H) 10/24/2017   ALT 58 (H) 07/04/2019   AST 36 07/04/2019   NA 140 07/04/2019   K 4.1 07/04/2019   CL 106 07/04/2019   CREATININE 0.89 07/04/2019   BUN 15 07/04/2019   CO2 27 07/04/2019   TSH 4.08 07/04/2019   HGBA1C 5.7 07/16/2015    US Abdomen Complete  Result Date: 07/22/2014 CLINICAL DATA:  Elevated LFTs EXAM: ULTRASOUND ABDOMEN COMPLETE COMPARISON:  09/14/2011 FINDINGS: Gallbladder: No gallstones, gallbladder wall thickening, or pericholecystic fluid. 3 mm cholesterol polyp. Negative sonographic Murphy's sign. Common bile duct: Diameter: 2 mm Liver: Hyperechoic hepatic parenchyma, reflecting hepatic steatosis. IVC: No abnormality visualized. Pancreas: Visualized portion unremarkable. Spleen: Size and appearance within normal limits. Right Kidney: Length: 11.4 cm.  No mass or hydronephrosis. Left Kidney: Length: 10.6 cm.  No mass or hydronephrosis. Abdominal aorta: No aneurysm visualized. Other findings: None. IMPRESSION: Hepatic steatosis. Electronically Signed   By: Julian Hy M.D.   On: 07/22/2014 08:27    Assessment & Plan:   There are no diagnoses linked to this encounter.   No orders of the defined types were placed in this encounter.    Follow-up: No follow-ups on file.  Walker Kehr, MD

## 2020-01-04 ENCOUNTER — Encounter: Payer: Self-pay | Admitting: Internal Medicine

## 2020-01-04 NOTE — Assessment & Plan Note (Signed)
CBC

## 2020-01-04 NOTE — Assessment & Plan Note (Signed)
The patient states she has been compliant with vitamin D treatment.  Obtain labs next time

## 2020-01-04 NOTE — Assessment & Plan Note (Signed)
The blood pressure is better controlled.  Continue with Exforge

## 2020-01-04 NOTE — Assessment & Plan Note (Signed)
Weight loss, good diet, statin use discussed

## 2020-01-04 NOTE — Assessment & Plan Note (Signed)
On B12.  Check levels next time

## 2020-01-04 NOTE — Assessment & Plan Note (Signed)
Lipids next time.  Continue with Lipitor

## 2020-01-04 NOTE — Assessment & Plan Note (Signed)
TSH 

## 2020-03-24 ENCOUNTER — Other Ambulatory Visit (INDEPENDENT_AMBULATORY_CARE_PROVIDER_SITE_OTHER): Payer: Medicare HMO

## 2020-03-24 DIAGNOSIS — E785 Hyperlipidemia, unspecified: Secondary | ICD-10-CM | POA: Diagnosis not present

## 2020-03-24 DIAGNOSIS — E559 Vitamin D deficiency, unspecified: Secondary | ICD-10-CM

## 2020-03-24 DIAGNOSIS — D751 Secondary polycythemia: Secondary | ICD-10-CM | POA: Diagnosis not present

## 2020-03-24 DIAGNOSIS — E034 Atrophy of thyroid (acquired): Secondary | ICD-10-CM

## 2020-03-24 DIAGNOSIS — K760 Fatty (change of) liver, not elsewhere classified: Secondary | ICD-10-CM | POA: Diagnosis not present

## 2020-03-24 DIAGNOSIS — E538 Deficiency of other specified B group vitamins: Secondary | ICD-10-CM

## 2020-03-24 LAB — TSH: TSH: 4.39 u[IU]/mL (ref 0.35–4.50)

## 2020-03-24 LAB — BASIC METABOLIC PANEL
BUN: 16 mg/dL (ref 6–23)
CO2: 28 mEq/L (ref 19–32)
Calcium: 9.5 mg/dL (ref 8.4–10.5)
Chloride: 103 mEq/L (ref 96–112)
Creatinine, Ser: 1.02 mg/dL (ref 0.40–1.20)
GFR: 53.88 mL/min — ABNORMAL LOW (ref >60.00)
Glucose, Bld: 135 mg/dL — ABNORMAL HIGH (ref 70–99)
Potassium: 4.3 mEq/L (ref 3.5–5.1)
Sodium: 139 mEq/L (ref 135–145)

## 2020-03-24 LAB — CBC WITH DIFFERENTIAL/PLATELET
Basophils Absolute: 0 10*3/uL (ref 0.0–0.1)
Basophils Relative: 0.4 % (ref 0.0–3.0)
Eosinophils Absolute: 0.1 10*3/uL (ref 0.0–0.7)
Eosinophils Relative: 1.1 % (ref 0.0–5.0)
HCT: 45.6 % (ref 36.0–46.0)
Hemoglobin: 15.5 g/dL — ABNORMAL HIGH (ref 12.0–15.0)
Lymphocytes Relative: 31.2 % (ref 12.0–46.0)
Lymphs Abs: 2.2 10*3/uL (ref 0.7–4.0)
MCHC: 34 g/dL (ref 30.0–36.0)
MCV: 84.8 fl (ref 78.0–100.0)
Monocytes Absolute: 0.5 10*3/uL (ref 0.1–1.0)
Monocytes Relative: 7.1 % (ref 3.0–12.0)
Neutro Abs: 4.2 10*3/uL (ref 1.4–7.7)
Neutrophils Relative %: 60.2 % (ref 43.0–77.0)
Platelets: 217 10*3/uL (ref 150.0–400.0)
RBC: 5.37 Mil/uL — ABNORMAL HIGH (ref 3.87–5.11)
RDW: 13.8 % (ref 11.5–15.5)
WBC: 6.9 10*3/uL (ref 4.0–10.5)

## 2020-03-24 LAB — VITAMIN B12: Vitamin B-12: 380 pg/mL (ref 211–911)

## 2020-03-24 LAB — LIPID PANEL
Cholesterol: 268 mg/dL — ABNORMAL HIGH (ref 0–200)
HDL: 56.4 mg/dL (ref >39.00)
NonHDL: 211.22
Total CHOL/HDL Ratio: 5
Triglycerides: 233 mg/dL — ABNORMAL HIGH (ref 0.0–149.0)
VLDL: 46.6 mg/dL — ABNORMAL HIGH (ref 0.0–40.0)

## 2020-03-24 LAB — HEPATIC FUNCTION PANEL
ALT: 65 U/L — ABNORMAL HIGH (ref 0–35)
AST: 44 U/L — ABNORMAL HIGH (ref 0–37)
Albumin: 4.3 g/dL (ref 3.5–5.2)
Alkaline Phosphatase: 78 U/L (ref 39–117)
Bilirubin, Direct: 0.2 mg/dL (ref 0.0–0.3)
Total Bilirubin: 1.2 mg/dL (ref 0.2–1.2)
Total Protein: 7.3 g/dL (ref 6.0–8.3)

## 2020-03-24 LAB — LDL CHOLESTEROL, DIRECT: Direct LDL: 169 mg/dL

## 2020-03-24 LAB — HEMOGLOBIN A1C: Hgb A1c MFr Bld: 6.2 % (ref 4.6–6.5)

## 2020-03-24 LAB — T4, FREE: Free T4: 0.73 ng/dL (ref 0.60–1.60)

## 2020-03-24 LAB — VITAMIN D 25 HYDROXY (VIT D DEFICIENCY, FRACTURES): VITD: 23.92 ng/mL — ABNORMAL LOW (ref 30.00–100.00)

## 2020-04-01 ENCOUNTER — Other Ambulatory Visit: Payer: Self-pay

## 2020-04-01 ENCOUNTER — Encounter: Payer: Self-pay | Admitting: Internal Medicine

## 2020-04-01 ENCOUNTER — Ambulatory Visit (INDEPENDENT_AMBULATORY_CARE_PROVIDER_SITE_OTHER): Payer: Medicare HMO | Admitting: Internal Medicine

## 2020-04-01 DIAGNOSIS — K760 Fatty (change of) liver, not elsewhere classified: Secondary | ICD-10-CM | POA: Diagnosis not present

## 2020-04-01 DIAGNOSIS — E559 Vitamin D deficiency, unspecified: Secondary | ICD-10-CM | POA: Diagnosis not present

## 2020-04-01 DIAGNOSIS — R7989 Other specified abnormal findings of blood chemistry: Secondary | ICD-10-CM

## 2020-04-01 DIAGNOSIS — D751 Secondary polycythemia: Secondary | ICD-10-CM

## 2020-04-01 DIAGNOSIS — I10 Essential (primary) hypertension: Secondary | ICD-10-CM

## 2020-04-01 DIAGNOSIS — E785 Hyperlipidemia, unspecified: Secondary | ICD-10-CM

## 2020-04-01 DIAGNOSIS — E538 Deficiency of other specified B group vitamins: Secondary | ICD-10-CM

## 2020-04-01 DIAGNOSIS — E034 Atrophy of thyroid (acquired): Secondary | ICD-10-CM

## 2020-04-01 NOTE — Assessment & Plan Note (Signed)
TSH 

## 2020-04-01 NOTE — Assessment & Plan Note (Signed)
Mild - discussed

## 2020-04-01 NOTE — Assessment & Plan Note (Signed)
Lipitor Risks associated with treatment noncompliance were discussed. Compliance was encouraged.

## 2020-04-01 NOTE — Assessment & Plan Note (Signed)
Pt refused repeat US

## 2020-04-01 NOTE — Assessment & Plan Note (Signed)
Dr Noreene Larsson

## 2020-04-01 NOTE — Assessment & Plan Note (Addendum)
Vit D is low Re-start Vit D Rx and OTC Risks associated with treatment noncompliance were discussed. Compliance was encouraged.

## 2020-04-01 NOTE — Assessment & Plan Note (Signed)
Pt refused [place] Loose wt

## 2020-04-01 NOTE — Progress Notes (Signed)
Subjective:  Patient ID: Kayla Aguirre, female    DOB: 10/08/1950  Age: 69 y.o. MRN: [id number]  CC: No chief complaint on file.   HPI Kayla Aguirre presents for HTN, B12 def, dyslipidemia f/u I advised the pt to get vaccinated against COVID19  Outpatient Medications Prior to Visit  Medication Sig Dispense Refill  . amLODipine-valsartan (EXFORGE) 10-160 MG tablet Take 1 tablet by mouth daily. 90 tablet 3  . atorvastatin (LIPITOR) 10 MG tablet Take 1 tablet (10 mg total) by mouth daily. 90 tablet 3  . cephALEXin (KEFLEX) 500 MG capsule Take 1 capsule (500 mg total) by mouth 2 (two) times daily. Use prn bladder infection 30 capsule 1  . Cholecalciferol (VITAMIN D3) 1.25 MG (50000 UT) CAPS Take 1 capsule by mouth once a week. 12 capsule 0  . Cholecalciferol (VITAMIN D3) 50 MCG (2000 UT) capsule Take 1 capsule (2,000 Units total) by mouth daily. 100 capsule 3  . Cyanocobalamin (VITAMIN B-12) 1000 MCG SUBL Place 1 tablet (1,000 mcg total) under the tongue daily. 100 tablet 3  . diclofenac sodium (VOLTAREN) 1 % GEL Apply 2 g topically 4 (four) times daily. 100 g 3  . hydrochlorothiazide (HYDRODIURIL) 25 MG tablet Take 1 tablet (25 mg total) by mouth daily. 90 tablet 3  . loratadine (CLARITIN) 10 MG tablet Take 1 tablet (10 mg total) by mouth daily. 100 tablet 3   No facility-administered medications prior to visit.    ROS: Review of Systems  Constitutional: Negative for activity change, appetite change, chills, fatigue and unexpected weight change.  HENT: Negative for congestion, mouth sores and sinus pressure.   Eyes: Negative for visual disturbance.  Respiratory: Negative for cough and chest tightness.   Gastrointestinal: Negative for abdominal pain and nausea.  Genitourinary: Negative for difficulty urinating, frequency and vaginal pain.  Musculoskeletal: Negative for back pain and gait problem.  Skin: Negative for pallor and rash.  Neurological: Negative for dizziness, tremors,  weakness, numbness and headaches.  Psychiatric/Behavioral: Negative for confusion, sleep disturbance and suicidal ideas.    Objective:  BP (!) 166/98 (BP Location: Right Arm, Patient Position: Sitting, Cuff Size: Normal)   Pulse 97   Temp 98.4 F (36.9 C) (Oral)   Ht 5\' 5"  (1.651 m)   Wt 196 lb (88.9 kg)   SpO2 97%   BMI 32.62 kg/m   BP Readings from Last 3 Encounters:  04/01/20 (!) 166/98  01/01/20 128/82  10/01/19 (!) 156/92    Wt Readings from Last 3 Encounters:  04/01/20 196 lb (88.9 kg)  01/01/20 198 lb (89.8 kg)  10/01/19 197 lb (89.4 kg)    Physical Exam Constitutional:      General: She is not in acute distress.    Appearance: She is well-developed.  HENT:     Head: Normocephalic.     Right Ear: External ear normal.     Left Ear: External ear normal.     Nose: Nose normal.  Eyes:     General:        Right eye: No discharge.        Left eye: No discharge.     Conjunctiva/sclera: Conjunctivae normal.     Pupils: Pupils are equal, round, and reactive to light.  Neck:     Thyroid: No thyromegaly.     Vascular: No JVD.     Trachea: No tracheal deviation.  Cardiovascular:     Rate and Rhythm: Normal rate and regular rhythm.     Heart sounds:  Normal heart sounds.  Pulmonary:     Effort: No respiratory distress.     Breath sounds: No stridor. No wheezing.  Abdominal:     General: Bowel sounds are normal. There is no distension.     Palpations: Abdomen is soft. There is no mass.     Tenderness: There is no abdominal tenderness. There is no guarding or rebound.  Musculoskeletal:        General: No tenderness.     Cervical back: Normal range of motion and neck supple.  Lymphadenopathy:     Cervical: No cervical adenopathy.  Skin:    Findings: No erythema or rash.  Neurological:     Cranial Nerves: No cranial nerve deficit.     Motor: No abnormal muscle tone.     Coordination: Coordination normal.     Deep Tendon Reflexes: Reflexes normal.  Psychiatric:         Behavior: Behavior normal.        Thought Content: Thought content normal.        Judgment: Judgment normal.     Lab Results  Component Value Date   WBC 6.9 03/24/2020   HGB 15.5 (H) 03/24/2020   HCT 45.6 03/24/2020   PLT 217.0 03/24/2020   GLUCOSE 135 (H) 03/24/2020   CHOL 268 (H) 03/24/2020   TRIG 233.0 (H) 03/24/2020   HDL 56.40 03/24/2020   LDLDIRECT 169.0 03/24/2020   LDLCALC 172 (H) 10/24/2017   ALT 65 (H) 03/24/2020   AST 44 (H) 03/24/2020   NA 139 03/24/2020   K 4.3 03/24/2020   CL 103 03/24/2020   CREATININE 1.02 03/24/2020   BUN 16 03/24/2020   CO2 28 03/24/2020   TSH 4.39 03/24/2020   HGBA1C 6.2 03/24/2020    US Abdomen Complete  Result Date: 07/22/2014 CLINICAL DATA:  Elevated LFTs EXAM: ULTRASOUND ABDOMEN COMPLETE COMPARISON:  09/14/2011 FINDINGS: Gallbladder: No gallstones, gallbladder wall thickening, or pericholecystic fluid. 3 mm cholesterol polyp. Negative sonographic Murphy's sign. Common bile duct: Diameter: 2 mm Liver: Hyperechoic hepatic parenchyma, reflecting hepatic steatosis. IVC: No abnormality visualized. Pancreas: Visualized portion unremarkable. Spleen: Size and appearance within normal limits. Right Kidney: Length: 11.4 cm.  No mass or hydronephrosis. Left Kidney: Length: 10.6 cm.  No mass or hydronephrosis. Abdominal aorta: No aneurysm visualized. Other findings: None. IMPRESSION: Hepatic steatosis. Electronically Signed   By: Julian Hy M.D.   On: 07/22/2014 08:27    Assessment & Plan:    Walker Kehr, MD

## 2020-04-01 NOTE — Assessment & Plan Note (Signed)
On B12 

## 2020-04-01 NOTE — Patient Instructions (Addendum)
Cut back on carbs Milk thistle for liver   MyChart app  Cardiac CT calcium scoring test $150 Tel # is [phone]   Computed tomography, more commonly known as a CT or CAT scan, is a diagnostic medical imaging test. Like traditional x-rays, it produces multiple images or pictures of the inside of the body. The cross-sectional images generated during a CT scan can be reformatted in multiple planes. They can even generate three-dimensional images. These images can be viewed on a computer monitor, printed on film or by a 3D printer, or transferred to a CD or DVD. CT images of internal organs, bones, soft tissue and blood vessels provide greater detail than traditional x-rays, particularly of soft tissues and blood vessels. A cardiac CT scan for coronary calcium is a non-invasive way of obtaining information about the presence, location and extent of calcified plaque in the coronary arteries--the vessels that supply oxygen-containing blood to the heart muscle. Calcified plaque results when there is a build-up of fat and other substances under the inner layer of the artery. This material can calcify which signals the presence of atherosclerosis, a disease of the vessel wall, also called coronary artery disease (CAD). People with this disease have an increased risk for heart attacks. In addition, over time, progression of plaque build up (CAD) can narrow the arteries or even close off blood flow to the heart. The result may be chest pain, sometimes called "angina," or a heart attack. Because calcium is a marker of CAD, the amount of calcium detected on a cardiac CT scan is a helpful prognostic tool. The findings on cardiac CT are expressed as a calcium score. Another name for this test is coronary artery calcium scoring.  What are some common uses of the procedure? The goal of cardiac CT scan for calcium scoring is to determine if CAD is present and to what extent, even if there are no symptoms. It is a  screening study that may be recommended by a physician for patients with risk factors for CAD but no clinical symptoms. The major risk factors for CAD are: . high blood cholesterol levels  . family history of heart attacks  . diabetes  . high blood pressure  . cigarette smoking  . overweight or obese  . physical inactivity   A negative cardiac CT scan for calcium scoring shows no calcification within the coronary arteries. This suggests that CAD is absent or so minimal it cannot be seen by this technique. The chance of having a heart attack over the next two to five years is very low under these circumstances. A positive test means that CAD is present, regardless of whether or not the patient is experiencing any symptoms. The amount of calcification--expressed as the calcium score--may help to predict the likelihood of a myocardial infarction (heart attack) in the coming years and helps your medical doctor or cardiologist decide whether the patient may need to take preventive medicine or undertake other measures such as diet and exercise to lower the risk for heart attack. The extent of CAD is graded according to your calcium score:  Calcium Score  Presence of CAD (coronary artery disease)  0 No evidence of CAD   1-10 Minimal evidence of CAD  11-100 Mild evidence of CAD  101-400 Moderate evidence of CAD  Over 400 Extensive evidence of CAD

## 2020-04-05 ENCOUNTER — Encounter (HOSPITAL_COMMUNITY): Payer: Self-pay | Admitting: Emergency Medicine

## 2020-04-05 ENCOUNTER — Emergency Department (HOSPITAL_COMMUNITY): Payer: Medicare HMO

## 2020-04-05 ENCOUNTER — Emergency Department (HOSPITAL_COMMUNITY)
Admission: EM | Admit: 2020-04-05 | Discharge: 2020-04-05 | Disposition: A | Discharge status: Home / Self Care | Payer: Medicare HMO | Attending: Emergency Medicine | Admitting: Emergency Medicine

## 2020-04-05 ENCOUNTER — Other Ambulatory Visit: Payer: Self-pay

## 2020-04-05 DIAGNOSIS — N200 Calculus of kidney: Secondary | ICD-10-CM

## 2020-04-05 DIAGNOSIS — R1030 Lower abdominal pain, unspecified: Secondary | ICD-10-CM | POA: Diagnosis present

## 2020-04-05 DIAGNOSIS — R112 Nausea with vomiting, unspecified: Secondary | ICD-10-CM

## 2020-04-05 DIAGNOSIS — K760 Fatty (change of) liver, not elsewhere classified: Secondary | ICD-10-CM | POA: Diagnosis not present

## 2020-04-05 DIAGNOSIS — I10 Essential (primary) hypertension: Secondary | ICD-10-CM | POA: Diagnosis not present

## 2020-04-05 DIAGNOSIS — R1031 Right lower quadrant pain: Secondary | ICD-10-CM | POA: Insufficient documentation

## 2020-04-05 DIAGNOSIS — K828 Other specified diseases of gallbladder: Secondary | ICD-10-CM | POA: Diagnosis not present

## 2020-04-05 DIAGNOSIS — N201 Calculus of ureter: Secondary | ICD-10-CM | POA: Diagnosis not present

## 2020-04-05 DIAGNOSIS — Z79899 Other long term (current) drug therapy: Secondary | ICD-10-CM | POA: Diagnosis not present

## 2020-04-05 DIAGNOSIS — R1084 Generalized abdominal pain: Secondary | ICD-10-CM | POA: Diagnosis not present

## 2020-04-05 DIAGNOSIS — R609 Edema, unspecified: Secondary | ICD-10-CM | POA: Diagnosis not present

## 2020-04-05 DIAGNOSIS — R110 Nausea: Secondary | ICD-10-CM | POA: Diagnosis not present

## 2020-04-05 DIAGNOSIS — R52 Pain, unspecified: Secondary | ICD-10-CM | POA: Diagnosis not present

## 2020-04-05 LAB — URINALYSIS, ROUTINE W REFLEX MICROSCOPIC
Bacteria, UA: NONE SEEN
Bilirubin Urine: NEGATIVE
Glucose, UA: 50 mg/dL — AB
Ketones, ur: NEGATIVE mg/dL
Leukocytes,Ua: NEGATIVE
Nitrite: NEGATIVE
Protein, ur: NEGATIVE mg/dL
Specific Gravity, Urine: 1.02 (ref 1.005–1.030)
pH: 6 (ref 5.0–8.0)

## 2020-04-05 LAB — CBC WITH DIFFERENTIAL/PLATELET
Abs Immature Granulocytes: 0.05 10*3/uL (ref 0.00–0.07)
Basophils Absolute: 0.1 10*3/uL (ref 0.0–0.1)
Basophils Relative: 0 %
Eosinophils Absolute: 0 10*3/uL (ref 0.0–0.5)
Eosinophils Relative: 0 %
HCT: 45.4 % (ref 36.0–46.0)
Hemoglobin: 14.9 g/dL (ref 12.0–15.0)
Immature Granulocytes: 0 %
Lymphocytes Relative: 14 %
Lymphs Abs: 1.8 10*3/uL (ref 0.7–4.0)
MCH: 28.4 pg (ref 26.0–34.0)
MCHC: 32.8 g/dL (ref 30.0–36.0)
MCV: 86.5 fL (ref 80.0–100.0)
Monocytes Absolute: 0.3 10*3/uL (ref 0.1–1.0)
Monocytes Relative: 2 %
Neutro Abs: 11.2 10*3/uL — ABNORMAL HIGH (ref 1.7–7.7)
Neutrophils Relative %: 84 %
Platelets: 233 10*3/uL (ref 150–400)
RBC: 5.25 MIL/uL — ABNORMAL HIGH (ref 3.87–5.11)
RDW: 12.6 % (ref 11.5–15.5)
WBC: 13.4 10*3/uL — ABNORMAL HIGH (ref 4.0–10.5)
nRBC: 0 % (ref 0.0–0.2)

## 2020-04-05 LAB — COMPREHENSIVE METABOLIC PANEL
ALT: 58 U/L — ABNORMAL HIGH (ref 0–44)
AST: 41 U/L (ref 15–41)
Albumin: 3.9 g/dL (ref 3.5–5.0)
Alkaline Phosphatase: 72 U/L (ref 38–126)
Anion gap: 14 (ref 5–15)
BUN: 23 mg/dL (ref 8–23)
CO2: 22 mmol/L (ref 22–32)
Calcium: 9.2 mg/dL (ref 8.9–10.3)
Chloride: 99 mmol/L (ref 98–111)
Creatinine, Ser: 1.14 mg/dL — ABNORMAL HIGH (ref 0.44–1.00)
GFR calc Af Amer: 57 mL/min — ABNORMAL LOW (ref >60)
GFR calc non Af Amer: 49 mL/min — ABNORMAL LOW (ref >60)
Glucose, Bld: 197 mg/dL — ABNORMAL HIGH (ref 70–99)
Potassium: 4.1 mmol/L (ref 3.5–5.1)
Sodium: 135 mmol/L (ref 135–145)
Total Bilirubin: 1.2 mg/dL (ref 0.3–1.2)
Total Protein: 7.3 g/dL (ref 6.5–8.1)

## 2020-04-05 LAB — LIPASE, BLOOD: Lipase: 34 U/L (ref 11–51)

## 2020-04-05 MED ORDER — TAMSULOSIN HCL 0.4 MG PO CAPS
0.4000 mg | ORAL_CAPSULE | Freq: Every day | ORAL | 0 refills | Status: DC
Start: 2020-04-05 — End: 2020-10-14

## 2020-04-05 MED ORDER — MORPHINE SULFATE (PF) 4 MG/ML IV SOLN
4.0000 mg | Freq: Once | INTRAVENOUS | Status: AC
  Administered 2020-04-05: 4 mg via INTRAVENOUS
  Filled 2020-04-05: qty 1

## 2020-04-05 MED ORDER — ONDANSETRON 4 MG PO TBDP
4.0000 mg | ORAL_TABLET | Freq: Three times a day (TID) | ORAL | 0 refills | Status: DC | PRN
Start: 2020-04-05 — End: 2021-01-12

## 2020-04-05 MED ORDER — OXYCODONE-ACETAMINOPHEN 5-325 MG PO TABS: 2.0000 | ORAL_TABLET | Freq: Four times a day (QID) | ORAL | 0 refills | Status: DC | PRN

## 2020-04-05 MED ORDER — SODIUM CHLORIDE 0.9 % IV SOLN: INTRAVENOUS | Status: DC

## 2020-04-05 MED ORDER — ONDANSETRON HCL 4 MG/2ML IJ SOLN
4.0000 mg | Freq: Once | INTRAMUSCULAR | Status: AC
  Administered 2020-04-05: 4 mg via INTRAVENOUS
  Filled 2020-04-05: qty 2

## 2020-04-05 MED ORDER — IOHEXOL 300 MG/ML  SOLN
100.0000 mL | Freq: Once | INTRAMUSCULAR | Status: AC | PRN
  Administered 2020-04-05: 100 mL via INTRAVENOUS

## 2020-04-05 MED ORDER — SODIUM CHLORIDE (PF) 0.9 % IJ SOLN
INTRAMUSCULAR | Status: AC
  Filled 2020-04-05: qty 50

## 2020-04-05 NOTE — ED Provider Notes (Signed)
8:25 AM Patient awake, alert, in no distress.  I discussed the final CT interpretation with her, her husband.  She will be provided a written copy of the report, understands importance of follow-up, and we discussed specifically need for ultrasound evaluation as an outpatient as well.   Carmin Muskrat, MD 04/05/20 [phone]

## 2020-04-05 NOTE — ED Provider Notes (Addendum)
TIME SEEN: 5:25 AM  CHIEF COMPLAINT: Abdominal pain, vomiting  HPI: Patient is a 69 year old female with history of hypertension, hyperlipidemia who presents to the emergency department by [facility] with lower abdominal pain that started yesterday.  States pain was sharp in nature and mild to moderate but improved.  States about 12 hours later pain came back and is more severe and in the right lower quadrant and radiates up into the right flank.  She describes having nausea and vomiting but no diarrhea, bloody stools or melena.  No fever or chills.  No dysuria, hematuria, vaginal bleeding or discharge.  No history of previous abdominal surgeries.  No history of previous kidney stone.  ROS: See HPI Constitutional: no fever  Eyes: no drainage  ENT: no runny nose   Cardiovascular:  no chest pain  Resp: no SOB  GI:  vomiting GU: no dysuria Integumentary: no rash  Allergy: no hives  Musculoskeletal: no leg swelling  Neurological: no slurred speech ROS otherwise negative  PAST MEDICAL HISTORY/PAST SURGICAL HISTORY:  Past Medical History:  Diagnosis Date  . Hypertension     MEDICATIONS:  Prior to Admission medications   Medication Sig Start Date End Date Taking? Authorizing Provider  amLODipine-valsartan (EXFORGE) 10-160 MG tablet Take 1 tablet by mouth daily. 01/01/20   Plotnikov, Evie Lacks, MD  atorvastatin (LIPITOR) 10 MG tablet Take 1 tablet (10 mg total) by mouth daily. 08/29/18   Plotnikov, Evie Lacks, MD  cephALEXin (KEFLEX) 500 MG capsule Take 1 capsule (500 mg total) by mouth 2 (two) times daily. Use prn bladder infection 10/01/19   Plotnikov, Evie Lacks, MD  Cholecalciferol (VITAMIN D3) 1.25 MG (50000 UT) CAPS Take 1 capsule by mouth once a week. 07/05/19   Plotnikov, Evie Lacks, MD  Cholecalciferol (VITAMIN D3) 50 MCG (2000 UT) capsule Take 1 capsule (2,000 Units total) by mouth daily. 10/01/19   Plotnikov, Evie Lacks, MD  Cyanocobalamin (VITAMIN B-12) 1000 MCG SUBL Place 1  tablet (1,000 mcg total) under the tongue daily. 10/01/19   Plotnikov, Evie Lacks, MD  diclofenac sodium (VOLTAREN) 1 % GEL Apply 2 g topically 4 (four) times daily. 08/27/12   Plotnikov, Evie Lacks, MD  hydrochlorothiazide (HYDRODIURIL) 25 MG tablet Take 1 tablet (25 mg total) by mouth daily. 07/01/19   Plotnikov, Evie Lacks, MD  loratadine (CLARITIN) 10 MG tablet Take 1 tablet (10 mg total) by mouth daily. 01/25/12 01/24/13  Plotnikov, Evie Lacks, MD    ALLERGIES:  Allergies  Allergen Reactions  . Erythromycin   . Hydrochlorothiazide     cramps    SOCIAL HISTORY:  Social History   Tobacco Use  . Smoking status: Never Smoker  . Smokeless tobacco: Never Used  Substance Use Topics  . Alcohol use: No    FAMILY HISTORY: Family History  Problem Relation Age of Onset  . Diabetes Father   . Stroke Father   . Myelodysplastic syndrome Mother   . Cancer Mother        MM  . Colon cancer Neg Hx   . Colon polyps Neg Hx   . Kidney disease Neg Hx     EXAM: BP (!) 143/78   Pulse 75   Temp 97.7 F (36.5 C)   Resp 18   Ht 5\' 5"  (1.651 m)   Wt 88.5 kg   SpO2 95%   BMI 32.45 kg/m  CONSTITUTIONAL: Alert and oriented and responds appropriately to questions.  Appears uncomfortable but is afebrile and nontoxic, elderly HEAD: Normocephalic  EYES: Conjunctivae clear, pupils appear equal, EOM appear intact ENT: normal nose; moist mucous membranes NECK: Supple, normal ROM CARD: RRR; S1 and S2 appreciated; no murmurs, no clicks, no rubs, no gallops RESP: Normal chest excursion without splinting or tachypnea; breath sounds clear and equal bilaterally; no wheezes, no rhonchi, no rales, no hypoxia or respiratory distress, speaking full sentences ABD/GI: Normal bowel sounds; non-distended; soft, tender to palpation in the right lower quadrant without guarding or rebound, no peritoneal signs BACK:  The back appears normal EXT: Normal ROM in all joints; no deformity noted, no edema; no cyanosis SKIN:  Normal color for age and race; warm; no rash on exposed skin NEURO: Moves all extremities equally PSYCH: The patient's mood and manner are appropriate.   MEDICAL DECISION MAKING: Patient here with right-sided abdominal pain.  Differential includes appendicitis, kidney stone, pyelonephritis, UTI, colitis, cholecystitis, pancreatitis.  Will obtain labs, urine and CT of the abdomen pelvis.  Will give IV fluids, pain and nausea medicine.  We will keep her n.p.o. at this time.  ED PROGRESS: 7:10 AM  Pt's labs show mild leukocytosis with left shift.  Creatinine minimally elevated at 1.14.  She is getting IV hydration.  I have reviewed patient's CT imaging and it appears she has a right-sided ureteral stone with hydroureter and hydronephrosis.  Awaiting official radiology read.  Urine pending.  She reports her pain is improved.  Anticipate discharge home with urology follow-up.  Will provide with urine strainer.  Will discharge with prescriptions of Percocet, Zofran, Flomax.  Patient and husband comfortable with this plan.  Urine shows 11-20 red blood cells but no other sign of infection.  Signed out to oncoming team to follow up on official CT read.  I reviewed all nursing notes and pertinent previous records as available.  I have reviewed and interpreted any EKGs, lab and urine results, imaging (as available).     Kayla Aguirre was evaluated in Emergency Department on 04/05/2020 for the symptoms described in the history of present illness. She was evaluated in the context of the global COVID-19 pandemic, which necessitated consideration that the patient might be at risk for infection with the SARS-CoV-2 virus that causes COVID-19. Institutional protocols and algorithms that pertain to the evaluation of patients at risk for COVID-19 are in a state of rapid change based on information released by regulatory bodies including the CDC and federal and state organizations. These policies and algorithms were  followed during the patient's care in the ED.         Candi Profit, Delice Bison, DO 04/05/20 [phone]

## 2020-04-05 NOTE — ED Triage Notes (Signed)
Patient brought in by EMS from home complain of 6/10 RLQ Abdominal pain started last night. Pt denies emesis since last night. Pt a/o x4 .

## 2020-04-05 NOTE — Discharge Instructions (Addendum)

## 2020-06-22 ENCOUNTER — Other Ambulatory Visit (INDEPENDENT_AMBULATORY_CARE_PROVIDER_SITE_OTHER): Payer: Medicare HMO

## 2020-06-22 ENCOUNTER — Other Ambulatory Visit: Payer: Self-pay

## 2020-06-22 DIAGNOSIS — E559 Vitamin D deficiency, unspecified: Secondary | ICD-10-CM

## 2020-06-22 DIAGNOSIS — R7989 Other specified abnormal findings of blood chemistry: Secondary | ICD-10-CM

## 2020-06-22 DIAGNOSIS — D751 Secondary polycythemia: Secondary | ICD-10-CM

## 2020-06-22 LAB — BASIC METABOLIC PANEL
BUN: 13 mg/dL (ref 6–23)
CO2: 27 mEq/L (ref 19–32)
Calcium: 9.7 mg/dL (ref 8.4–10.5)
Chloride: 104 mEq/L (ref 96–112)
Creatinine, Ser: 0.95 mg/dL (ref 0.40–1.20)
GFR: 58.45 mL/min — ABNORMAL LOW (ref >60.00)
Glucose, Bld: 92 mg/dL (ref 70–99)
Potassium: 4 mEq/L (ref 3.5–5.1)
Sodium: 138 mEq/L (ref 135–145)

## 2020-06-22 LAB — CBC WITH DIFFERENTIAL/PLATELET
Basophils Absolute: 0 10*3/uL (ref 0.0–0.1)
Basophils Relative: 0.6 % (ref 0.0–3.0)
Eosinophils Absolute: 0.1 10*3/uL (ref 0.0–0.7)
Eosinophils Relative: 1 % (ref 0.0–5.0)
HCT: 44 % (ref 36.0–46.0)
Hemoglobin: 15 g/dL (ref 12.0–15.0)
Lymphocytes Relative: 34 % (ref 12.0–46.0)
Lymphs Abs: 2.4 10*3/uL (ref 0.7–4.0)
MCHC: 34.1 g/dL (ref 30.0–36.0)
MCV: 84.1 fl (ref 78.0–100.0)
Monocytes Absolute: 0.5 10*3/uL (ref 0.1–1.0)
Monocytes Relative: 7.2 % (ref 3.0–12.0)
Neutro Abs: 4.1 10*3/uL (ref 1.4–7.7)
Neutrophils Relative %: 57.2 % (ref 43.0–77.0)
Platelets: 218 10*3/uL (ref 150.0–400.0)
RBC: 5.23 Mil/uL — ABNORMAL HIGH (ref 3.87–5.11)
RDW: 13.6 % (ref 11.5–15.5)
WBC: 7.1 10*3/uL (ref 4.0–10.5)

## 2020-06-22 LAB — HEPATIC FUNCTION PANEL
ALT: 40 U/L — ABNORMAL HIGH (ref 0–35)
AST: 33 U/L (ref 0–37)
Albumin: 4.1 g/dL (ref 3.5–5.2)
Alkaline Phosphatase: 76 U/L (ref 39–117)
Bilirubin, Direct: 0.1 mg/dL (ref 0.0–0.3)
Total Bilirubin: 1.3 mg/dL — ABNORMAL HIGH (ref 0.2–1.2)
Total Protein: 7.1 g/dL (ref 6.0–8.3)

## 2020-06-22 LAB — VITAMIN D 25 HYDROXY (VIT D DEFICIENCY, FRACTURES): VITD: 16.29 ng/mL — ABNORMAL LOW (ref 30.00–100.00)

## 2020-06-22 NOTE — Addendum Note (Signed)
Addended by: Cresenciano Lick on: 06/22/2020 02:11 PM   Modules accepted: Orders

## 2020-06-22 NOTE — Addendum Note (Signed)
Addended by: Cresenciano Lick on: 06/22/2020 02:10 PM   Modules accepted: Orders

## 2020-06-23 LAB — IRON,TIBC AND FERRITIN PANEL
%SAT: 25 % (calc) (ref 16–45)
Ferritin: 94 ng/mL (ref 16–288)
Iron: 89 ug/dL (ref 45–160)
TIBC: 352 mcg/dL (calc) (ref 250–450)

## 2020-06-25 ENCOUNTER — Ambulatory Visit (INDEPENDENT_AMBULATORY_CARE_PROVIDER_SITE_OTHER): Payer: Medicare HMO | Admitting: Internal Medicine

## 2020-06-25 ENCOUNTER — Other Ambulatory Visit: Payer: Self-pay

## 2020-06-25 ENCOUNTER — Encounter: Payer: Self-pay | Admitting: Internal Medicine

## 2020-06-25 DIAGNOSIS — K8020 Calculus of gallbladder without cholecystitis without obstruction: Secondary | ICD-10-CM

## 2020-06-25 DIAGNOSIS — E538 Deficiency of other specified B group vitamins: Secondary | ICD-10-CM

## 2020-06-25 DIAGNOSIS — E559 Vitamin D deficiency, unspecified: Secondary | ICD-10-CM | POA: Diagnosis not present

## 2020-06-25 DIAGNOSIS — R7989 Other specified abnormal findings of blood chemistry: Secondary | ICD-10-CM | POA: Diagnosis not present

## 2020-06-25 DIAGNOSIS — K760 Fatty (change of) liver, not elsewhere classified: Secondary | ICD-10-CM | POA: Diagnosis not present

## 2020-06-25 DIAGNOSIS — I10 Essential (primary) hypertension: Secondary | ICD-10-CM | POA: Diagnosis not present

## 2020-06-25 DIAGNOSIS — N200 Calculus of kidney: Secondary | ICD-10-CM | POA: Diagnosis not present

## 2020-06-25 DIAGNOSIS — E034 Atrophy of thyroid (acquired): Secondary | ICD-10-CM | POA: Diagnosis not present

## 2020-06-25 MED ORDER — AMLODIPINE BESYLATE-VALSARTAN 10-160 MG PO TABS: 1.0000 | ORAL_TABLET | Freq: Every day | ORAL | 3 refills | Status: DC

## 2020-06-25 NOTE — Assessment & Plan Note (Signed)
Vit D 

## 2020-06-25 NOTE — Progress Notes (Signed)
Subjective:  Patient ID: Kayla Aguirre, female    DOB: [birth year]  Age: 69 y.o. MRN: [id number]  CC: Follow-up   HPI [name] presents for a renal colic 11/5571, dyslipidemia, Vit D def f/u.  The patient has not been taking her meds and vitamins for the most part. No recent urinary or GI related symptoms  Outpatient Medications Prior to Visit  Medication Sig Dispense Refill  . amLODipine-valsartan (EXFORGE) 10-160 MG tablet Take 1 tablet by mouth daily. 90 tablet 3  . atorvastatin (LIPITOR) 10 MG tablet Take 1 tablet (10 mg total) by mouth daily. 90 tablet 3  . cephALEXin (KEFLEX) 500 MG capsule Take 1 capsule (500 mg total) by mouth 2 (two) times daily. Use prn bladder infection 30 capsule 1  . Cholecalciferol (VITAMIN D3) 1.25 MG (50000 UT) CAPS Take 1 capsule by mouth once a week. 12 capsule 0  . Cholecalciferol (VITAMIN D3) 50 MCG (2000 UT) capsule Take 1 capsule (2,000 Units total) by mouth daily. 100 capsule 3  . Cyanocobalamin (VITAMIN B-12) 1000 MCG SUBL Place 1 tablet (1,000 mcg total) under the tongue daily. 100 tablet 3  . diclofenac sodium (VOLTAREN) 1 % GEL Apply 2 g topically 4 (four) times daily. 100 g 3  . hydrochlorothiazide (HYDRODIURIL) 25 MG tablet Take 1 tablet (25 mg total) by mouth daily. 90 tablet 3  . ondansetron (ZOFRAN ODT) 4 MG disintegrating tablet Take 1 tablet (4 mg total) by mouth every 8 (eight) hours as needed for nausea or vomiting. 20 tablet 0  . oxyCODONE-acetaminophen (PERCOCET/ROXICET) 5-325 MG tablet Take 2 tablets by mouth every 6 (six) hours as needed. 16 tablet 0  . tamsulosin (FLOMAX) 0.4 MG CAPS capsule Take 1 capsule (0.4 mg total) by mouth daily. Please take until stone has passed. 30 capsule 0  . loratadine (CLARITIN) 10 MG tablet Take 1 tablet (10 mg total) by mouth daily. 100 tablet 3   No facility-administered medications prior to visit.    ROS: Review of Systems  Constitutional: Negative for activity change, appetite change,  chills, fatigue and unexpected weight change.  HENT: Negative for congestion, mouth sores and sinus pressure.   Eyes: Negative for visual disturbance.  Respiratory: Negative for cough and chest tightness.   Gastrointestinal: Negative for abdominal pain and nausea.  Genitourinary: Negative for difficulty urinating, frequency and vaginal pain.  Musculoskeletal: Negative for back pain and gait problem.  Skin: Negative for pallor and rash.  Neurological: Negative for dizziness, tremors, weakness, numbness and headaches.  Psychiatric/Behavioral: Negative for confusion and sleep disturbance.    Objective:  BP (!) 154/100 (BP Location: Right Arm, Patient Position: Sitting, Cuff Size: Normal)   Pulse 97   Temp 98.4 F (36.9 C) (Oral)   Ht 5\' 5"  (1.651 m)   Wt 184 lb (83.5 kg)   SpO2 95%   BMI 30.62 kg/m   BP Readings from Last 3 Encounters:  06/25/20 (!) 154/100  04/05/20 (!) 144/69  04/01/20 (!) 166/98    Wt Readings from Last 3 Encounters:  06/25/20 184 lb (83.5 kg)  04/05/20 195 lb (88.5 kg)  04/01/20 196 lb (88.9 kg)    Physical Exam Constitutional:      General: She is not in acute distress.    Appearance: She is well-developed. She is obese.  HENT:     Head: Normocephalic.     Right Ear: External ear normal.     Left Ear: External ear normal.     Nose: Nose normal.  Eyes:     General:        Right eye: No discharge.        Left eye: No discharge.     Conjunctiva/sclera: Conjunctivae normal.     Pupils: Pupils are equal, round, and reactive to light.  Neck:     Thyroid: No thyromegaly.     Vascular: No JVD.     Trachea: No tracheal deviation.  Cardiovascular:     Rate and Rhythm: Normal rate and regular rhythm.     Heart sounds: Normal heart sounds.  Pulmonary:     Effort: No respiratory distress.     Breath sounds: No stridor. No wheezing.  Abdominal:     General: Bowel sounds are normal. There is no distension.     Palpations: Abdomen is soft. There is no  mass.     Tenderness: There is no abdominal tenderness. There is no guarding or rebound.  Musculoskeletal:        General: No tenderness.     Cervical back: Normal range of motion and neck supple.  Lymphadenopathy:     Cervical: No cervical adenopathy.  Skin:    Findings: No erythema or rash.  Neurological:     Cranial Nerves: No cranial nerve deficit.     Motor: No abnormal muscle tone.     Coordination: Coordination normal.     Deep Tendon Reflexes: Reflexes normal.  Psychiatric:        Behavior: Behavior normal.        Thought Content: Thought content normal.        Judgment: Judgment normal.     Lab Results  Component Value Date   WBC 7.1 06/22/2020   HGB 15.0 06/22/2020   HCT 44.0 06/22/2020   PLT 218.0 06/22/2020   GLUCOSE 92 06/22/2020   CHOL 268 (H) 03/24/2020   TRIG 233.0 (H) 03/24/2020   HDL 56.40 03/24/2020   LDLDIRECT 169.0 03/24/2020   LDLCALC 172 (H) 10/24/2017   ALT 40 (H) 06/22/2020   AST 33 06/22/2020   NA 138 06/22/2020   K 4.0 06/22/2020   CL 104 06/22/2020   CREATININE 0.95 06/22/2020   BUN 13 06/22/2020   CO2 27 06/22/2020   TSH 4.39 03/24/2020   HGBA1C 6.2 03/24/2020    CT ABDOMEN PELVIS W CONTRAST  Result Date: 04/05/2020 CLINICAL DATA:  Right lower quadrant pain. Evaluate for appendicitis versus stone. EXAM: CT ABDOMEN AND PELVIS WITH CONTRAST TECHNIQUE: Multidetector CT imaging of the abdomen and pelvis was performed using the standard protocol following bolus administration of intravenous contrast. CONTRAST:  110mL OMNIPAQUE IOHEXOL 300 MG/ML  SOLN COMPARISON:  07/22/2014 abdominal ultrasound. FINDINGS: Lower chest: Lingular scarring. Minimal dependent bibasilar atelectasis. Normal heart size without pericardial or pleural effusion. Hepatobiliary: Moderate hepatic steatosis and mild hepatomegaly at 18.3 cm craniocaudal. 9 mm stone within the gallbladder neck. Soft tissue density at the gallbladder fundus measures 7 mm on 36/2. No acute  cholecystitis or biliary duct dilatation. Pancreas: Normal, without mass or ductal dilatation. Spleen: Normal in size, without focal abnormality. Adrenals/Urinary Tract: Normal right adrenal gland. Left adrenal nodule measures 1.3 cm and demonstrates significant washout on delayed images, consistent with an adenoma. Normal left kidney. Moderate right-sided hydroureteronephrosis with decreased contrast excretion. Hydroureter continues to the level of a 4 x 6 mm proximal right ureteric stone on coronal image 85 and transverse image 57. Normal urinary bladder. Stomach/Bowel: Normal stomach, without wall thickening. Normal colon, appendix, and terminal ileum. Normal small bowel. Vascular/Lymphatic: Aortic  atherosclerosis. No abdominopelvic adenopathy. Reproductive: Normal uterus and adnexa. Other: No significant free fluid.  Mild pelvic floor laxity. Musculoskeletal: Degenerative disc disease at L5-S1 and L1-2. IMPRESSION: 1. Moderate right-sided urinary tract obstruction secondary to a 4 x 5 mm proximal right ureteric stone. 2. Hepatic steatosis and hepatomegaly. 3. Cholelithiasis. Soft tissue density at the gallbladder fundus could represent a polyp or adenomyomatosis. Consider nonemergent, outpatient ultrasound follow-up. 4. Left adrenal adenoma. 5. Aortic Atherosclerosis (ICD10-I70.0). Electronically Signed   By: Abigail Miyamoto M.D.   On: 04/05/2020 07:55    Assessment & Plan:    Walker Kehr, MD

## 2020-06-25 NOTE — Assessment & Plan Note (Signed)
Cont w/wt loss 

## 2020-06-25 NOTE — Assessment & Plan Note (Addendum)
Re-start meds Risks associated with treatment noncompliance were discussed. Compliance was encouraged.

## 2020-06-25 NOTE — Assessment & Plan Note (Addendum)
LFTs are better w/weight loss, good diet, statin use discussed Milk thistle

## 2020-06-25 NOTE — Assessment & Plan Note (Signed)
Take B12

## 2020-06-25 NOTE — Assessment & Plan Note (Addendum)
R 5 mm w/hydronephrosis Resolved Abd [place] [facility]

## 2020-06-25 NOTE — Patient Instructions (Addendum)
???????? ????? Kidney Stones  ???????? ????? -- ??? ???????, ????????????? ?????????, ??????? ??????????? ?????? ?????. ????? -- ??? ?????? ?????, ?????????????? ????. ???????? ?????? ????? ?????????????? ? ????? ? ????????????? ? ?????? ?????? ????????????? ?????, ??????? ??????, ??????????? ????? ? ??????? ??????? (???????????), ??????? ??????, ? ????? ??????, ?? ??????? ???? ????????? ?? ????????? (??????). ?? ???? ???????? ????? ????? ??? ?????? ?? ????? ??????? ??????? ???? ? ??????????? ????? ????. ???????? ????? ?????????? ??? ?????????? ?????????? ????????? ????????? ? ????. ????? ?????? ????????? ?? ????????? ??? ??????????????, ?? ? ????????? ??????? ??? ?? ???????? ????? ????????????? ???????. ?????? ???????? ??????????? ?????? ? ?????? ????? ???? ??????? ?????????:  ??????????, ??? ??????? ????????? ?????? ???????????? ??????? ????? ??????? ??????????????? ?????? (????????? ???????????????), ??? ???????? ?????????? ?????????? ??????? ? ?????.  ?????? ?????????? ??????? ??????? ? ??????? ?????? (???????????????). ??????? ??????? -- ??? ?????????? ????????, ???????????? ? ????????? ??? ???????????? ? ???? ???????????? ?????????. ??? ?????? ????????? ?? ????????? ? ?????.  ???????? (?????????) ?????? ??? ????? ????????????.  ???????? ???????? ? ???????? (?????????? ??????????????).  ?????????? ????? ???????? ?? ?????? ? ????????????, ????????, ????? ???????? ?? ???????????? ???????. ??? ???????? ????? ?????????????? ???????? ????? ????????? ????? ????????? ???????:  ??????????? ????????? ????? ? ???????.  ??????????? ???????? ??????? ?? ???????????? ???????.  ????????????? ??????????? ????.  ??????????? ???? ? ??????? ??????????? ?????, ???? (??????) ??? ??????.  ?????????? ??? ??? ????????. ?????? ???????? ??? ????????? ?????????? ???????????? ??????? ????? ????:  ???? ? ????, ????? ??? ??????? (???????? ??????). ???? ?????? ???????????????? (???????????) ? ???????  ???????.  ?????? ??? ??????? ?????? ? ??????????????.  ??????????? ??????????????.  ????? ? ???? (?????????).  ???????.  ?????.  ????????? ? ?????. ??? ??? ?????????????? ??? ????????? ????? ???? ??????????????? ? ??????:  ????? ????????? ? ???????????? ????????.  ???????????? ????????????.  ???????? ?????.  ???????? ????. ??? ????? ???? ????????? ?? ? ????? ?????? ????? ?? ????????? ?? ????? ??????????????.  ???????????????? ????????????, ????????, ?? (???????????? ??????????), ??????? ??????? ??????? ??????? ???? ???.  ????????? ???????????? ???????? ?????? ??????? (???????????). ??? ??? ?????? ??????? ???????????? ??????? ??????? ?? ???????, ?????????????? ? ??????? ??????. ???????? ????? ????? ????????? ?? ????????? ?? ????? ??????????????. ????????, ??? ???????????:  ????????? ??????????? ????????, ????? ?????????????? ????????? ?????. ? ????????? ??????? ??? ????? ??????? ???????? ??????????? ? ????? ????????????? ?????????????? ? ????? ?????????? ?? ??????????.  ????????? ?????????????? ????????.  ???????? ?????? ???????, ????? ????????????? ????????? ??????????? ???????? ??????. ?????? ?????????? ??????????? ????????? ??? ???????? ????????? ?????. ??? ????? ????? ?????????????:  ????????? ????????? ???????? ?????? ???????????: ? ??????????????? ???? ????? (?????????????). ? ??????? ???? (?????????????????? ??????-???????? ???????????).  ???????? ?? ???????? ???????? ??????. ??? ????? ????????????? ??? ??????? ??????? ????? ??? ????? ????? ????????? ????????????? ????. ? ???????? ???????? ???????? ?????? ?????????: ????????????? ?????????  ?????????? ?????????????? ? ??????????? ????????????? ???????? ?????? ? ???????????? ? ?????????? ?????? ???????? ?????.  ???????? ? ?????? ???????? ?????, ??????? ?? ??? ???????? ???????? ?????????? ??? ?????????? ??????? ???????? ? ????? ? ??????? ???????????? ?????????. ??? ? ???????  ????? ?????????? ????????, ????? ????  ???? ?????????? ??????-??????? ?????. ??? ????? ????????? ???????? 8-10 ???????? ???? ?????? ????. ??? ??????? ????????? ????? ?????.  ???? ??? ?????????, ???????? ???? ?????? ???????. ??? ????? ????: ? ??????????? ????????????? ? ????? ??????. ? ???????????? ? ???? ???????? ?????????? ??????? ? ??????. ? ??????????? ???????????? ? ???? ????????? ?????, ????????, ???????? ????, ?????, ???? ? ???.  ?????????? ??????????? ?????? ???????? ????? ? ????????? ??????????? ??? ??? ?????. ????? ????????  ????????? ??????? ???? ??? ??????? ????? ??????? ??????. ????????, ??? ??????????? ??????? ??????? ????: ? ????? 24 ???? ????? ????, ??? ?????? ??????. ? ?????  8-12 ?????? ????? ??????????? ????????? ????? ? ?????? 6-12 ??????? ????? ?????.  ?? ?????????? ?????????? ??? ??????? ???????????? ???? ?????? ???, ????? ????????. ??????????? ??????, ??????? ????????????? ??? ??????? ????.  ?? ??????? ??????????? ???????? ?????? ????? ??? ??????. ????????? ???? ??????, ????? ??? ??????? ???? ??? ??????????? ???. ???????????? ??????? ????????? ????? ????? ?????? ???????????? ???????????????? ? ????? ?????? ? ???????.  ????????? ?? ??? ?????? ???????????? ??????????, ??? ??????? ????? ??????? ??????. ??? ?????. ????????, ??? ??????????? ??????????? ?????????????? ??? ???, ????? ?????????, ??? ?????? ?????. ????? ?????????? ???? ????????????? ????? ????????????? ??????????? ??? ?????? ????? ? ??????:  ????? ?????????? ????????, ????? ???? ???? ?????????? ??????-??????? ?????. ??? ????????? ?????? ???????????? ???????????????? ? ??????.  ??????????????? ???????? ????? ? ???????? ????????????? ?????? ????? ? ????????? ?????????, ???????????? ??????? ??????? ????????. ????????, ??? ???????? ????? ? ?????? ??????????? ?????. ???????????? ??????? ?? ???? ???????????? ? ??? ?????? ? ??????.  ????????????? ?????????? ???. ??? ????? ????? ?????????????? ??????????  [employer]) (????????????  ???????? ????): [email or web address]) (???? ????????????? ??????): [email or web address] ?????????? ? ???????? ?????, ????:  ?? ????????? ?? ????, ??????? ???????????? ??? ?? ????? ??????????, ???????? ?? ????? ????????. ?????????? ?????????? ?? ???????, ????:  ? ??? ???????? ????????? ??? ?????.  ? ??? ???????? ??????? ????.  ? ??? ????????? ????? ???? ? ??????? ??????.  ?? ??????? ? ???????.  ?? ?? ?????? ??????????. ??????  ???????? ????? -- ??? ???????, ????????????? ?????????, ??????? ??????????? ?????? ?????.  ???????? ????? ????? ???????? ???????, ?????, ????? ? ????, ???? ? ?????? ? ?????? ?????? ? ??????????????.  ??????? ???????????? ??????? ??????? ?? ???????, ?????????????? ? ??????? ??????. ???????? ????? ????? ????????? ?? ????????? ?? ????? ??????????????.  ??????????? ???????? ?????? ????? ???????????? ????? ???????????? ???????????? ?????????? ????????, ????????????? ????????? ??????? ??????? ? ??????????? ???????? ???. ??? ?????????? ?? ????? ???????? ??????, ??????????????? ????? ??????. ??????????? ???????? ????? ???????????? ??? ??????? ? ????? ??????? ??????. Document Revised: 03/21/2019 Document Reviewed: 03/21/2019 Elsevier Patient Education  [place]. ?????????????? ??????? Cholelithiasis  ?????????????? ??????? -- ??? ????? ??????????? ???????? ??????, ??? ??????? ? ??????? ?????? ?????????? ?????. ??????? ?????? ???????????? ????? ?????, ? ??????? ???????? ?????. ????? ?????????????? ? ?????? ? ???????? ???????????? ????. ??????????? ?????? ? ??????? ?????? ?????????? ? ????????? ??????????, ??????? ?????????? ???????????? ? ?????. ??? ????? ?? ??????? ??????? ????????? ?? ??? ???, ???? ??????? ?????? ?? ?????? ????????? (???????????) ? ??????? ?????? ?? ??????????? ?????? (??????? ??????), ??? ????? ??????? ????. ?????????????? ??????? ????? ???????? ??????? ? ??????? ??????. ????????? ??? ???????? ???? ???????  ??????:  ?????????????? ?????. ??? ??????????? ?? ????????????? ??????????? ? ?????? ????? ?????-??????? ????. ??? ???????? ???????????????? ??? ??????? ??????. ?????????? -- ??? ????? ?????????????, ???????????? ????????, ??????? ?????????? ? ??????.  ?????????? ?????. ??? ??????? ????? ? ???????????? ?? ??????-??????? ????????, ??????? ?????????? ??? ?????????? ??????????? ??????????? (??????????). ?????? ???????? ??? ????????? ????? ???? ??????????? ??????????? ???????, ???????? ? ?????? ?????. ??? ????? ?????????, ???? ?????:  ???????? ??????? ????? ??????????.  ???????? ??????? ????? ???????????.  ?? ????? ???????????? ?????????? ??????? ?????. ??? ???? ???????? ????????? ????????? ? ????????? ????. ? ????????? ??????? ??? ????????? ????? ???? ????? ??????????? ???, ??? ??????? ?????? ?? ???????????? ????????? ??? ?????????? ?????. ??? ???????? ????? ?????????????? ???????? ????? ??????????? ????? ????????? ???????:  ?????????????? ? ???????? ????.  ???????????? ????????????. ????? ?????? ??????????? ??????? ??????? ??????? ?????? ????? ???????? ??????????????.  ???????????? ???????? ?????????? ??????? ?????????, ? ??????? ????? ? ?????????????? ?????????, ????? ??? ????? ???? ? ????? ???.  ??????? ????????.  ??????? ?????? 40 ???.  ?????????? ????? ??????????, ?????????? ??????? ??????? (????????).  ???????? ??????.  ??????? ???????? ????.  ??????? ?????? ? ??????? ?????? ? ?????????????.  ?????????????? ? ??????-?????????? ?????? ???????????? ???????? ??? ????? ????????????? ?????????????.  ??????? ????????? ???????????, ?????? ??? ??????? ?????.  ??????? ??????????????? ????????.  ??????? ???????.  ??????? ????????? ??????, ????????, ??????????-????????? ??????. ?????? ???????? ??? ????????? ? ??????????? ??????? ??? ????????? ????? ?? ???????????. ??? ?????????? ????????????? ??????? ???????????. ???? ??????? ?????? ????????? ??????? ???????, ?? ????? ???????  ??????? ??????. ???????? ????????? ??????? ?????? ???????? ????????? ???? ? ??????? ?????? ????? ??????. ???? ?????? ?????????? ? ?????? ????? ??? ????? ???????? ?????? ????. ???? ????? ???????????? ? ??????? ?????? ??? ?????????? ????? ? ????? ???????? ?? ?????? ????? ??? ?????. ???? ??????? ?????? ???????????? ? ??????? ????? ?????????? ?????, ????? ????????? ???????? ??? ?????????? ???????? ??????, ?????? ??? ????????????? ??????, ??? ????? ???????:  ???????.  ?????.  ???? ? ??????, ????????  5 ????? ??? ??????.  ????????? ??? ?????.  ?????????? ???? ??? ?????? ???? (???????).  ?????? ????.  ????????????? ???. ??? ??? ???????????????? ??? ????????? ????? ???? ??????????????? ? ??????:  ???????????? ????????????.  ???????????? ????????.  ??? ???????? ??????.  ?? (???????????? ??????????).  ???.  ???????? ????? ??? ???????? ?? ??????? ????????? ???????? ??? ??????????.  ???????????? ???????? ?????? ? ??????? ???????? (?????????????????? ???????) ? ?????????????? ?????????? ?????????????? ???????? ? ??????????? ?????, ??????????? ??????? ????????????? ???????? (????????????????). ? ???? ????? ???????????? ?????????, ??? ??????????? ??????? ?????? ? ?? ????????????? ?? ??????? ???????.  ????? ???????? ????????? ?????? ? ???????????? ?? ????? (????????) ????? ???, ????? ????????? ??????? ??????? ? ??????? ???????? ???????????? (??????????????? ???????????? ???????????????????????). ??? ??? ?????? ??????? ?????????????? ??????? ??????? ?? ??????? ?????????. ??? ????????????? ??????? ???????????? ??????? ?? ?????????. ???? ??????? ????? ??????????? ??????? ?????? ??? ?????? ????????, ????? ????????????? ???????. ?????????? ????????? ??????? ???????:  ????????????? ???????? ???????? ?????? (???????????????). ??? ???????? ???????????????? ????? ???????.  ????? ????????????? ?????????? ??? ??????????? ??????? ??????. ??? ???????? ??????????? ????? ??? ??????? ????????? ?????? ?  ??????? ??????. ????????, ??? ???????? ????????? ????? ????????? ? ??????? ???????? 6-12 ???????.  ??????-???????? ??????? (?????????????????? ??????????? ???????? ??????). ???? ?????? ??????? ??????????? ? ???, ??? ?????????????? ??????? ?????????? ??????? ????? ?? ??????? ??????, ????? ?????????? ??????? ????? ?? ?????? ???????. ??? ??????? ????? ????? ???? ???????? ????? ???????? ??? ?????????? ? ??????? ?????????????? ?????????. ???? ????? ???????????? ?????.  ???????? ??????? ?????? ? ???? ??????????????? ???????????? ???????????????????????. ? ????????? ????? ????????? ????????, ??????? ???????????? ??? ????? ? ???????? ?????? ?? ???????? ??????. ? ???????? ???????? ???????? ????? ?????????:  ?????????? ?????????????? ? ??????????? ????????????? ????????? ?????? ??? ??????? ????? ??????? ??????.  ????????????? ?????????? ??? ? ????????? ?????????. ???? ?????????: ? ??????????? ??????????? ?????? ?????????, ????????, ??????? ????. ? ??????????? ??????????? ?????????????? ?????????, ????? ??? ????? ???? ? ????? ???. ? ?????????? ??????????? ?????????. ?????????? ??????????? ????? ????????, ??? ???????, ?????? ? ????.  ????????? ?? ??? ??????????? ?????? ??? ??????? ????? ??????? ??????. ??? ?????. ?????????? ? ???????? ?????, ????:  ?? ?????????, ??? ? ??? ???????? ??????? ??????.  ??? ????????? ??????? ????????????? ??????? ??????????? ? ? ??? ????????? ???? ? ?????? ??? ???????????? ???????????. ?????????? ?????????? ?? ???????, ????:  ????, ????????? ? ??? ??????? ??????? ???????????? ????? 2 ?????.  ???? ? ?????? ? ??? ???????????? ????? 5 ?????.  ? ??? ???????? ????????? ??? ?????.  ? ??? ??????? ??????? ? ?????.  ? ??? ????????? ???????.  ? ??? ????????? ?????? ???? ??? ??????? ???. ??????  ?????????????? ??????? (????? ????????? ????? ? ??????? ??????)-- ??? ????? ??????????? ???????? ??????, ??? ??????? ? ??????? ?????? ?????????? ?????.  ??? ????????? ???????????  ??????????? ???????, ?????????? ?????. ??? ????? ?????????, ???? ? ????? ??????? ????? ???????????, ??????? ????? ?????????? ??? ???????????? ??????? ?????.  ??? ????????? ??????????? ? ??????? ????????????, ???? ?? -- ???????, ?????????? ? ????????? ????????????, ??????????? ????????? ? ??????????, ????????? ?????????, ?????? 40 ??? ??? ? ??? ??????????? ???????? ??????? ?? ??????? ??????. ????? ? ??????? ?????? ????? ????? ????????????, ???? ? ??? ??????, ???????? ???????????, ?????? ??? ?????????????? ???????.  ??????? ?????????????? ??????? ??????? ?? ??????? ?????????. ??? ????????????? ??????? ???????????? ??????? ?? ?????????.  ???? ??????? ????? ??????????? ??????? ?????? ??? ?????? ????????, ????? ??????????? ???????. ???????? ???????????????? ???????? ???????? ???????? ?? ???????? ???????? ??????. ??? ?????????? ?? ????? ???????? ??????, ??????????????? ????? ??????. ??????????? ???????? ????? ???????????? ??? ??????? ? ????? ??????? ??????. Document Revised: 06/02/2017 Document Reviewed: 02/27/2013 Elsevier Patient Education  [place].      call [employer] Line at [phone].

## 2020-06-25 NOTE — Assessment & Plan Note (Signed)
7/21 CT 9 mm

## 2020-06-25 NOTE — Assessment & Plan Note (Signed)
Not on Rx 

## 2020-06-26 NOTE — Addendum Note (Signed)
Addended by: Earnstine Regal on: 06/26/2020 01:30 PM   Modules accepted: Orders

## 2020-06-26 NOTE — Progress Notes (Signed)
Had to enter new order for US Abdomen first order location  entered incorrectly.Marland KitchenJohny Chess

## 2020-07-03 ENCOUNTER — Ambulatory Visit
Admission: RE | Admit: 2020-07-03 | Discharge: 2020-07-03 | Disposition: A | Discharge status: Home / Self Care | Payer: Medicare HMO | Source: Ambulatory Visit | Attending: Internal Medicine | Admitting: Internal Medicine

## 2020-07-03 DIAGNOSIS — K8020 Calculus of gallbladder without cholecystitis without obstruction: Secondary | ICD-10-CM | POA: Diagnosis not present

## 2020-07-03 DIAGNOSIS — I10 Essential (primary) hypertension: Secondary | ICD-10-CM

## 2020-07-03 DIAGNOSIS — R7989 Other specified abnormal findings of blood chemistry: Secondary | ICD-10-CM

## 2020-07-07 ENCOUNTER — Ambulatory Visit: Payer: Medicare HMO | Admitting: Internal Medicine

## 2020-09-28 ENCOUNTER — Ambulatory Visit: Payer: Medicare HMO | Admitting: Internal Medicine

## 2020-10-05 ENCOUNTER — Ambulatory Visit: Payer: Medicare HMO | Admitting: Internal Medicine

## 2020-10-14 ENCOUNTER — Ambulatory Visit (INDEPENDENT_AMBULATORY_CARE_PROVIDER_SITE_OTHER): Payer: Medicare HMO | Admitting: Internal Medicine

## 2020-10-14 ENCOUNTER — Encounter: Payer: Self-pay | Admitting: Internal Medicine

## 2020-10-14 ENCOUNTER — Other Ambulatory Visit: Payer: Self-pay

## 2020-10-14 DIAGNOSIS — I10 Essential (primary) hypertension: Secondary | ICD-10-CM

## 2020-10-14 DIAGNOSIS — D508 Other iron deficiency anemias: Secondary | ICD-10-CM

## 2020-10-14 DIAGNOSIS — K8020 Calculus of gallbladder without cholecystitis without obstruction: Secondary | ICD-10-CM | POA: Diagnosis not present

## 2020-10-14 DIAGNOSIS — E538 Deficiency of other specified B group vitamins: Secondary | ICD-10-CM

## 2020-10-14 DIAGNOSIS — E559 Vitamin D deficiency, unspecified: Secondary | ICD-10-CM

## 2020-10-14 DIAGNOSIS — E785 Hyperlipidemia, unspecified: Secondary | ICD-10-CM | POA: Diagnosis not present

## 2020-10-14 LAB — URINALYSIS
Bilirubin Urine: NEGATIVE
Hgb urine dipstick: NEGATIVE
Ketones, ur: NEGATIVE
Leukocytes,Ua: NEGATIVE
Nitrite: NEGATIVE
Specific Gravity, Urine: 1.02 (ref 1.000–1.030)
Total Protein, Urine: NEGATIVE
Urine Glucose: NEGATIVE
Urobilinogen, UA: 0.2 (ref 0.0–1.0)
pH: 5 (ref 5.0–8.0)

## 2020-10-14 LAB — VITAMIN B12: Vitamin B-12: 445 pg/mL (ref 211–911)

## 2020-10-14 LAB — COMPREHENSIVE METABOLIC PANEL
ALT: 31 U/L (ref 0–35)
AST: 25 U/L (ref 0–37)
Albumin: 4.4 g/dL (ref 3.5–5.2)
Alkaline Phosphatase: 80 U/L (ref 39–117)
BUN: 17 mg/dL (ref 6–23)
CO2: 28 mEq/L (ref 19–32)
Calcium: 9.8 mg/dL (ref 8.4–10.5)
Chloride: 102 mEq/L (ref 96–112)
Creatinine, Ser: 0.93 mg/dL (ref 0.40–1.20)
GFR: 63.11 mL/min (ref >60.00)
Glucose, Bld: 98 mg/dL (ref 70–99)
Potassium: 3.7 mEq/L (ref 3.5–5.1)
Sodium: 138 mEq/L (ref 135–145)
Total Bilirubin: 1.3 mg/dL — ABNORMAL HIGH (ref 0.2–1.2)
Total Protein: 7.7 g/dL (ref 6.0–8.3)

## 2020-10-14 LAB — CBC WITH DIFFERENTIAL/PLATELET
Basophils Absolute: 0 10*3/uL (ref 0.0–0.1)
Basophils Relative: 0.4 % (ref 0.0–3.0)
Eosinophils Absolute: 0.1 10*3/uL (ref 0.0–0.7)
Eosinophils Relative: 1 % (ref 0.0–5.0)
HCT: 45.6 % (ref 36.0–46.0)
Hemoglobin: 15.4 g/dL — ABNORMAL HIGH (ref 12.0–15.0)
Lymphocytes Relative: 29.3 % (ref 12.0–46.0)
Lymphs Abs: 2.8 10*3/uL (ref 0.7–4.0)
MCHC: 33.7 g/dL (ref 30.0–36.0)
MCV: 83.6 fl (ref 78.0–100.0)
Monocytes Absolute: 0.6 10*3/uL (ref 0.1–1.0)
Monocytes Relative: 6.7 % (ref 3.0–12.0)
Neutro Abs: 5.9 10*3/uL (ref 1.4–7.7)
Neutrophils Relative %: 62.6 % (ref 43.0–77.0)
Platelets: 233 10*3/uL (ref 150.0–400.0)
RBC: 5.45 Mil/uL — ABNORMAL HIGH (ref 3.87–5.11)
RDW: 13.8 % (ref 11.5–15.5)
WBC: 9.4 10*3/uL (ref 4.0–10.5)

## 2020-10-14 LAB — TSH: TSH: 3.57 u[IU]/mL (ref 0.35–4.50)

## 2020-10-14 LAB — VITAMIN D 25 HYDROXY (VIT D DEFICIENCY, FRACTURES): VITD: 22.73 ng/mL — ABNORMAL LOW (ref 30.00–100.00)

## 2020-10-14 MED ORDER — HYDROCHLOROTHIAZIDE 25 MG PO TABS: 25.0000 mg | ORAL_TABLET | Freq: Every day | ORAL | 3 refills | Status: DC

## 2020-10-14 NOTE — Assessment & Plan Note (Signed)
CBC

## 2020-10-14 NOTE — Progress Notes (Signed)
Subjective:  Patient ID: Kayla Aguirre, female    DOB: 1951-03-12  Age: 70 y.o. MRN: [id number]  CC: Follow-up (3 month f/u)   HPI Verdelle Valtierra presents for B12, Vit D def, HTN  Outpatient Medications Prior to Visit  Medication Sig Dispense Refill   amLODipine-valsartan (EXFORGE) 10-160 MG tablet Take 1 tablet by mouth daily. 90 tablet 3   Cholecalciferol (VITAMIN D3) 50 MCG (2000 UT) capsule Take 1 capsule (2,000 Units total) by mouth daily. 100 capsule 3   Cyanocobalamin (VITAMIN B-12) 1000 MCG SUBL Place 1 tablet (1,000 mcg total) under the tongue daily. 100 tablet 3   ondansetron (ZOFRAN ODT) 4 MG disintegrating tablet Take 1 tablet (4 mg total) by mouth every 8 (eight) hours as needed for nausea or vomiting. 20 tablet 0   oxyCODONE-acetaminophen (PERCOCET/ROXICET) 5-325 MG tablet Take 2 tablets by mouth every 6 (six) hours as needed. 16 tablet 0   atorvastatin (LIPITOR) 10 MG tablet Take 1 tablet (10 mg total) by mouth daily. (Patient not taking: Reported on 10/14/2020) 90 tablet 3   loratadine (CLARITIN) 10 MG tablet Take 1 tablet (10 mg total) by mouth daily. 100 tablet 3   cephALEXin (KEFLEX) 500 MG capsule Take 1 capsule (500 mg total) by mouth 2 (two) times daily. Use prn bladder infection (Patient not taking: Reported on 10/14/2020) 30 capsule 1   Cholecalciferol (VITAMIN D3) 1.25 MG (50000 UT) CAPS Take 1 capsule by mouth once a week. (Patient not taking: Reported on 10/14/2020) 12 capsule 0   diclofenac sodium (VOLTAREN) 1 % GEL Apply 2 g topically 4 (four) times daily. (Patient not taking: Reported on 10/14/2020) 100 g 3   tamsulosin (FLOMAX) 0.4 MG CAPS capsule Take 1 capsule (0.4 mg total) by mouth daily. Please take until stone has passed. (Patient not taking: Reported on 10/14/2020) 30 capsule 0   No facility-administered medications prior to visit.    ROS: Review of Systems  Constitutional: Negative for activity change, appetite change, chills, fatigue and unexpected  weight change.  HENT: Negative for congestion, mouth sores and sinus pressure.   Eyes: Negative for visual disturbance.  Respiratory: Negative for cough and chest tightness.   Gastrointestinal: Negative for abdominal pain and nausea.  Genitourinary: Negative for difficulty urinating, frequency and vaginal pain.  Musculoskeletal: Negative for back pain and gait problem.  Skin: Negative for pallor and rash.  Neurological: Negative for dizziness, tremors, weakness, numbness and headaches.  Psychiatric/Behavioral: Negative for confusion and sleep disturbance.    Objective:  BP (!) 148/82 (BP Location: Left Arm)    Pulse 84    Temp 98.5 F (36.9 C) (Oral)    Ht 5\' 5"  (1.651 m)    Wt 184 lb 6.4 oz (83.6 kg)    SpO2 93%    BMI 30.69 kg/m   BP Readings from Last 3 Encounters:  10/14/20 (!) 148/82  06/25/20 (!) 154/100  04/05/20 (!) 144/69    Wt Readings from Last 3 Encounters:  10/14/20 184 lb 6.4 oz (83.6 kg)  06/25/20 184 lb (83.5 kg)  04/05/20 195 lb (88.5 kg)    Physical Exam Constitutional:      General: She is not in acute distress.    Appearance: She is well-developed. She is obese.  HENT:     Head: Normocephalic.     Right Ear: External ear normal.     Left Ear: External ear normal.     Nose: Nose normal.     Mouth/Throat:  Mouth: Oropharynx is clear and moist.  Eyes:     General:        Right eye: No discharge.        Left eye: No discharge.     Conjunctiva/sclera: Conjunctivae normal.     Pupils: Pupils are equal, round, and reactive to light.  Neck:     Thyroid: No thyromegaly.     Vascular: No JVD.     Trachea: No tracheal deviation.  Cardiovascular:     Rate and Rhythm: Normal rate and regular rhythm.     Heart sounds: Normal heart sounds.  Pulmonary:     Effort: No respiratory distress.     Breath sounds: No stridor. No wheezing.  Abdominal:     General: Bowel sounds are normal. There is no distension.     Palpations: Abdomen is soft. There is no  mass.     Tenderness: There is no abdominal tenderness. There is no guarding or rebound.  Musculoskeletal:        General: No tenderness or edema.     Cervical back: Normal range of motion and neck supple.  Lymphadenopathy:     Cervical: No cervical adenopathy.  Skin:    Findings: No erythema or rash.  Neurological:     Mental Status: She is oriented to person, place, and time.     Cranial Nerves: No cranial nerve deficit.     Motor: No abnormal muscle tone.     Coordination: Coordination normal.     Deep Tendon Reflexes: Reflexes normal.  Psychiatric:        Mood and Affect: Mood and affect normal.        Behavior: Behavior normal.        Thought Content: Thought content normal.        Judgment: Judgment normal.     Lab Results  Component Value Date   WBC 7.1 06/22/2020   HGB 15.0 06/22/2020   HCT 44.0 06/22/2020   PLT 218.0 06/22/2020   GLUCOSE 92 06/22/2020   CHOL 268 (H) 03/24/2020   TRIG 233.0 (H) 03/24/2020   HDL 56.40 03/24/2020   LDLDIRECT 169.0 03/24/2020   LDLCALC 172 (H) 10/24/2017   ALT 40 (H) 06/22/2020   AST 33 06/22/2020   NA 138 06/22/2020   K 4.0 06/22/2020   CL 104 06/22/2020   CREATININE 0.95 06/22/2020   BUN 13 06/22/2020   CO2 27 06/22/2020   TSH 4.39 03/24/2020   HGBA1C 6.2 03/24/2020    US Abdomen Complete  Result Date: 07/05/2020 CLINICAL DATA:  Cholelithiasis follow-up EXAM: ABDOMEN ULTRASOUND COMPLETE COMPARISON:  CT AP 04/05/2020 FINDINGS: Gallbladder: Multiple gallstones. A negative sonographic Percell Miller sign was reported by the sonographer. Gallstone measures 12.6 mm. No wall thickening or pericholecystic fluid. Common bile duct: Diameter: 2 mm Liver: Coarse hepatic echotexture. Portal vein is patent on color Doppler imaging with normal direction of blood flow towards the liver. IVC: No abnormality visualized. Pancreas: Visualized portion unremarkable. Spleen: Size and appearance within normal limits. Right Kidney: Length: 10.6 cm.  Echogenicity within normal limits. No mass or hydronephrosis visualized. Left Kidney: Length: 10.4 cm. Echogenicity within normal limits. No mass or hydronephrosis visualized. Abdominal aorta: No aneurysm visualized. Other findings: None. IMPRESSION: Unchanged cholelithiasis without other evidence of acute cholecystitis. Electronically Signed   By: Ulyses Jarred M.D.   On: 07/05/2020 00:49    Assessment & Plan:    Follow-up: No follow-ups on file.  Walker Kehr, MD

## 2020-10-14 NOTE — Patient Instructions (Addendum)
For a mild COVID-19 case you can take zinc 50 mg a day for 1 week, vitamin C 1000 mg daily for 1 week, vitamin D2 50,000 units weekly for 2 months (unless you are taking vitamin D daily already), Quercetin 500 mg twice a day for 1 week (if you can get it quick enough).  Maintain good oral hydration and take Tylenol with high fever.    You can buy COVID-19 express tests for home use. They are inexpensive, roughly $15 for 2 tests.  There is a link below: [email or web address]  You can order 4 free COVID-19 home tests via [employer]   ------------------------------------------------------------------------   Cardiac CT calcium scoring test $150 Tel # is [phone]   Computed tomography, more commonly known as a CT or CAT scan, is a diagnostic medical imaging test. Like traditional x-rays, it produces multiple images or pictures of the inside of the body. The cross-sectional images generated during a CT scan can be reformatted in multiple planes. They can even generate three-dimensional images. These images can be viewed on a computer monitor, printed on film or by a 3D printer, or transferred to a CD or DVD. CT images of internal organs, bones, soft tissue and blood vessels provide greater detail than traditional x-rays, particularly of soft tissues and blood vessels. A cardiac CT scan for coronary calcium is a non-invasive way of obtaining information about the presence, location and extent of calcified plaque in the coronary arteries-the vessels that supply oxygen-containing blood to the heart muscle. Calcified plaque results when there is a build-up of fat and other substances under the inner layer of the artery. This material can calcify which signals the presence of atherosclerosis, a disease of the vessel wall, also called coronary artery disease (CAD). People with this disease have an increased risk for heart attacks. In addition, over time, progression of plaque build up (CAD)  can narrow the arteries or even close off blood flow to the heart. The result may be chest pain, sometimes called "angina," or a heart attack. Because calcium is a marker of CAD, the amount of calcium detected on a cardiac CT scan is a helpful prognostic tool. The findings on cardiac CT are expressed as a calcium score. Another name for this test is coronary artery calcium scoring.  What are some common uses of the procedure? The goal of cardiac CT scan for calcium scoring is to determine if CAD is present and to what extent, even if there are no symptoms. It is a screening study that may be recommended by a physician for patients with risk factors for CAD but no clinical symptoms. The major risk factors for CAD are: . high blood cholesterol levels  . family history of heart attacks  . diabetes  . high blood pressure  . cigarette smoking  . overweight or obese  . physical inactivity   A negative cardiac CT scan for calcium scoring shows no calcification within the coronary arteries. This suggests that CAD is absent or so minimal it cannot be seen by this technique. The chance of having a heart attack over the next two to five years is very low under these circumstances. A positive test means that CAD is present, regardless of whether or not the patient is experiencing any symptoms. The amount of calcification-expressed as the calcium score-may help to predict the likelihood of a myocardial infarction (heart attack) in the coming years and helps your medical doctor or cardiologist decide whether the patient may need to take preventive  medicine or undertake other measures such as diet and exercise to lower the risk for heart attack. The extent of CAD is graded according to your calcium score:  Calcium Score  Presence of CAD (coronary artery disease)  0 No evidence of CAD   1-10 Minimal evidence of CAD  11-100 Mild evidence of CAD  101-400 Moderate evidence of CAD  Over 400 Extensive evidence of  CAD

## 2020-10-14 NOTE — Assessment & Plan Note (Signed)
On B12 

## 2020-10-14 NOTE — Assessment & Plan Note (Signed)
Cardiac CT scan for calcium scoring offered Not taking Lipitor

## 2020-10-14 NOTE — Assessment & Plan Note (Signed)
Vit D 

## 2020-10-14 NOTE — Assessment & Plan Note (Signed)
Pt states BP is low at times - pt doesn't take her meds as prescribed - every other day 1/2 tab Exforge

## 2020-11-25 DIAGNOSIS — Z1231 Encounter for screening mammogram for malignant neoplasm of breast: Secondary | ICD-10-CM | POA: Diagnosis not present

## 2020-11-25 DIAGNOSIS — Z124 Encounter for screening for malignant neoplasm of cervix: Secondary | ICD-10-CM | POA: Diagnosis not present

## 2020-11-25 DIAGNOSIS — Z01419 Encounter for gynecological examination (general) (routine) without abnormal findings: Secondary | ICD-10-CM | POA: Diagnosis not present

## 2020-11-25 DIAGNOSIS — Z7689 Persons encountering health services in other specified circumstances: Secondary | ICD-10-CM | POA: Diagnosis not present

## 2020-11-30 ENCOUNTER — Other Ambulatory Visit: Payer: Self-pay | Admitting: Obstetrics and Gynecology

## 2020-11-30 DIAGNOSIS — R928 Other abnormal and inconclusive findings on diagnostic imaging of breast: Secondary | ICD-10-CM

## 2020-12-16 ENCOUNTER — Ambulatory Visit
Admission: RE | Admit: 2020-12-16 | Discharge: 2020-12-16 | Disposition: A | Discharge status: Home / Self Care | Payer: Medicare HMO | Source: Ambulatory Visit | Attending: Obstetrics and Gynecology | Admitting: Obstetrics and Gynecology

## 2020-12-16 ENCOUNTER — Other Ambulatory Visit: Payer: Self-pay

## 2020-12-16 ENCOUNTER — Ambulatory Visit: Payer: Medicare HMO

## 2020-12-16 DIAGNOSIS — R928 Other abnormal and inconclusive findings on diagnostic imaging of breast: Secondary | ICD-10-CM | POA: Diagnosis not present

## 2020-12-16 DIAGNOSIS — R922 Inconclusive mammogram: Secondary | ICD-10-CM | POA: Diagnosis not present

## 2021-01-12 ENCOUNTER — Ambulatory Visit (INDEPENDENT_AMBULATORY_CARE_PROVIDER_SITE_OTHER): Payer: Medicare HMO | Admitting: Internal Medicine

## 2021-01-12 ENCOUNTER — Encounter: Payer: Self-pay | Admitting: Internal Medicine

## 2021-01-12 ENCOUNTER — Other Ambulatory Visit: Payer: Self-pay

## 2021-01-12 DIAGNOSIS — E538 Deficiency of other specified B group vitamins: Secondary | ICD-10-CM

## 2021-01-12 DIAGNOSIS — D751 Secondary polycythemia: Secondary | ICD-10-CM | POA: Diagnosis not present

## 2021-01-12 DIAGNOSIS — E785 Hyperlipidemia, unspecified: Secondary | ICD-10-CM

## 2021-01-12 DIAGNOSIS — E559 Vitamin D deficiency, unspecified: Secondary | ICD-10-CM | POA: Diagnosis not present

## 2021-01-12 DIAGNOSIS — I10 Essential (primary) hypertension: Secondary | ICD-10-CM | POA: Diagnosis not present

## 2021-01-12 DIAGNOSIS — M79671 Pain in right foot: Secondary | ICD-10-CM

## 2021-01-12 DIAGNOSIS — I700 Atherosclerosis of aorta: Secondary | ICD-10-CM | POA: Diagnosis not present

## 2021-01-12 MED ORDER — HYDROCHLOROTHIAZIDE 25 MG PO TABS: 25.0000 mg | ORAL_TABLET | Freq: Every day | ORAL | 3 refills | Status: DC

## 2021-01-12 MED ORDER — CEPHALEXIN 500 MG PO CAPS: 500.0000 mg | ORAL_CAPSULE | Freq: Two times a day (BID) | ORAL | 1 refills | Status: DC

## 2021-01-12 NOTE — Assessment & Plan Note (Signed)
CBC

## 2021-01-12 NOTE — Assessment & Plan Note (Signed)
R 5th - X ray

## 2021-01-12 NOTE — Assessment & Plan Note (Signed)
Declined Lipitor

## 2021-01-12 NOTE — Assessment & Plan Note (Signed)
TaRisks associated with treatment noncompliance were discussed. Compliance was encouraged.

## 2021-01-12 NOTE — Assessment & Plan Note (Signed)
Diet, exercise Re-start Lipitor

## 2021-01-12 NOTE — Progress Notes (Signed)
Subjective:  Patient ID: Kayla Aguirre, female    DOB: 13-May-1951  Age: 70 y.o. MRN: [id number]  CC: Follow-up (3 month f/u)   HPI Jhordan P Lerner presents for hyperlipidemia, hypertension, vitamin D deficiency and vitamin B12 deficiency follow-up.  The patient has not been taking her blood pressure meds and vitamins regular. We discussed compliance, and a need for good compliance  Outpatient Medications Prior to Visit  Medication Sig Dispense Refill  . amLODipine-valsartan (EXFORGE) 10-160 MG tablet Take 1 tablet by mouth daily. 90 tablet 3  . Cholecalciferol (VITAMIN D3) 50 MCG (2000 UT) capsule Take 1 capsule (2,000 Units total) by mouth daily. 100 capsule 3  . Cyanocobalamin (VITAMIN B-12) 1000 MCG SUBL Place 1 tablet (1,000 mcg total) under the tongue daily. 100 tablet 3  . hydrochlorothiazide (HYDRODIURIL) 25 MG tablet Take 1 tablet (25 mg total) by mouth daily. 90 tablet 3  . atorvastatin (LIPITOR) 10 MG tablet Take 1 tablet (10 mg total) by mouth daily. (Patient not taking: No sig reported) 90 tablet 3  . loratadine (CLARITIN) 10 MG tablet Take 1 tablet (10 mg total) by mouth daily. 100 tablet 3  . ondansetron (ZOFRAN ODT) 4 MG disintegrating tablet Take 1 tablet (4 mg total) by mouth every 8 (eight) hours as needed for nausea or vomiting. (Patient not taking: Reported on 01/12/2021) 20 tablet 0  . oxyCODONE-acetaminophen (PERCOCET/ROXICET) 5-325 MG tablet Take 2 tablets by mouth every 6 (six) hours as needed. (Patient not taking: Reported on 01/12/2021) 16 tablet 0   No facility-administered medications prior to visit.    ROS: Review of Systems  Constitutional: Positive for unexpected weight change. Negative for activity change, appetite change, chills and fatigue.  HENT: Negative for congestion, mouth sores and sinus pressure.   Eyes: Negative for visual disturbance.  Respiratory: Negative for cough and chest tightness.   Gastrointestinal: Negative for abdominal pain and  nausea.  Genitourinary: Negative for difficulty urinating, frequency and vaginal pain.  Musculoskeletal: Negative for back pain and gait problem.  Skin: Negative for pallor and rash.  Neurological: Negative for dizziness, tremors, weakness, numbness and headaches.  Psychiatric/Behavioral: Negative for confusion, sleep disturbance and suicidal ideas.    Objective:  BP (!) 144/70 (BP Location: Left Arm)   Pulse 98   Temp 98.9 F (37.2 C) (Oral)   Ht 5\' 5"  (1.651 m)   Wt 190 lb 6.4 oz (86.4 kg)   SpO2 97%   BMI 31.68 kg/m   BP Readings from Last 3 Encounters:  01/12/21 (!) 144/70  10/14/20 (!) 148/82  06/25/20 (!) 154/100    Wt Readings from Last 3 Encounters:  01/12/21 190 lb 6.4 oz (86.4 kg)  10/14/20 184 lb 6.4 oz (83.6 kg)  06/25/20 184 lb (83.5 kg)    Physical Exam Constitutional:      General: She is not in acute distress.    Appearance: She is well-developed. She is obese.  HENT:     Head: Normocephalic.     Right Ear: External ear normal.     Left Ear: External ear normal.     Nose: Nose normal.  Eyes:     General:        Right eye: No discharge.        Left eye: No discharge.     Conjunctiva/sclera: Conjunctivae normal.     Pupils: Pupils are equal, round, and reactive to light.  Neck:     Thyroid: No thyromegaly.     Vascular: No  JVD.     Trachea: No tracheal deviation.  Cardiovascular:     Rate and Rhythm: Normal rate and regular rhythm.     Heart sounds: Normal heart sounds.  Pulmonary:     Effort: No respiratory distress.     Breath sounds: No stridor. No wheezing.  Abdominal:     General: Bowel sounds are normal. There is no distension.     Palpations: Abdomen is soft. There is no mass.     Tenderness: There is no abdominal tenderness. There is no guarding or rebound.  Musculoskeletal:        General: No tenderness.     Cervical back: Normal range of motion and neck supple.  Lymphadenopathy:     Cervical: No cervical adenopathy.  Skin:     Findings: No erythema or rash.  Neurological:     Cranial Nerves: No cranial nerve deficit.     Motor: No abnormal muscle tone.     Coordination: Coordination normal.     Deep Tendon Reflexes: Reflexes normal.  Psychiatric:        Behavior: Behavior normal.        Thought Content: Thought content normal.        Judgment: Judgment normal.     Lab Results  Component Value Date   WBC 9.4 10/14/2020   HGB 15.4 (H) 10/14/2020   HCT 45.6 10/14/2020   PLT 233.0 10/14/2020   GLUCOSE 98 10/14/2020   CHOL 268 (H) 03/24/2020   TRIG 233.0 (H) 03/24/2020   HDL 56.40 03/24/2020   LDLDIRECT 169.0 03/24/2020   LDLCALC 172 (H) 10/24/2017   ALT 31 10/14/2020   AST 25 10/14/2020   NA 138 10/14/2020   K 3.7 10/14/2020   CL 102 10/14/2020   CREATININE 0.93 10/14/2020   BUN 17 10/14/2020   CO2 28 10/14/2020   TSH 3.57 10/14/2020   HGBA1C 6.2 03/24/2020    MM DIAG BREAST TOMO UNI LEFT  Result Date: 12/16/2020 CLINICAL DATA:  70 year old female recalled from screening mammogram dated 11/25/2020 for a possible left breast mass. EXAM: DIGITAL DIAGNOSTIC UNILATERAL LEFT MAMMOGRAM WITH TOMOSYNTHESIS AND CAD TECHNIQUE: Left digital diagnostic mammography and breast tomosynthesis was performed. The images were evaluated with computer-aided detection. COMPARISON:  Previous exam(s). ACR Breast Density Category b: There are scattered areas of fibroglandular density. FINDINGS: Previously described possible mass in the upper outer left breast at middle depth does not persist on today's additional views. This area resolves into well dispersed fibroglandular tissue. No suspicious findings identified. IMPRESSION: No mammographic evidence of malignancy. RECOMMENDATION: Screening mammogram in one year.(Code:SM-B-01Y) I have discussed the findings and recommendations with the patient. If applicable, a reminder letter will be sent to the patient regarding the next appointment. BI-RADS CATEGORY  1: Negative.  Electronically Signed   By: Kristopher Oppenheim M.D.   On: 12/16/2020 13:50    Assessment & Plan:    Walker Kehr, MD

## 2021-01-12 NOTE — Patient Instructions (Signed)

## 2021-01-12 NOTE — Assessment & Plan Note (Signed)
Vit D 

## 2021-01-12 NOTE — Assessment & Plan Note (Signed)
On B12 

## 2021-01-13 LAB — CBC WITH DIFFERENTIAL/PLATELET
Basophils Absolute: 0.1 10*3/uL (ref 0.0–0.1)
Basophils Relative: 0.6 % (ref 0.0–3.0)
Eosinophils Absolute: 0.2 10*3/uL (ref 0.0–0.7)
Eosinophils Relative: 1.7 % (ref 0.0–5.0)
HCT: 44.2 % (ref 36.0–46.0)
Hemoglobin: 15.2 g/dL — ABNORMAL HIGH (ref 12.0–15.0)
Lymphocytes Relative: 30.5 % (ref 12.0–46.0)
Lymphs Abs: 2.8 10*3/uL (ref 0.7–4.0)
MCHC: 34.3 g/dL (ref 30.0–36.0)
MCV: 83.8 fl (ref 78.0–100.0)
Monocytes Absolute: 0.7 10*3/uL (ref 0.1–1.0)
Monocytes Relative: 7.5 % (ref 3.0–12.0)
Neutro Abs: 5.5 10*3/uL (ref 1.4–7.7)
Neutrophils Relative %: 59.7 % (ref 43.0–77.0)
Platelets: 216 10*3/uL (ref 150.0–400.0)
RBC: 5.27 Mil/uL — ABNORMAL HIGH (ref 3.87–5.11)
RDW: 13.4 % (ref 11.5–15.5)
WBC: 9.1 10*3/uL (ref 4.0–10.5)

## 2021-01-13 LAB — VITAMIN D 25 HYDROXY (VIT D DEFICIENCY, FRACTURES): VITD: 15.79 ng/mL — ABNORMAL LOW (ref 30.00–100.00)

## 2021-02-03 ENCOUNTER — Encounter: Payer: Self-pay | Admitting: Internal Medicine

## 2021-03-12 ENCOUNTER — Ambulatory Visit (INDEPENDENT_AMBULATORY_CARE_PROVIDER_SITE_OTHER)
Admission: RE | Admit: 2021-03-12 | Discharge: 2021-03-12 | Disposition: A | Discharge status: Home / Self Care | Payer: Self-pay | Source: Ambulatory Visit | Attending: Internal Medicine | Admitting: Internal Medicine

## 2021-03-12 ENCOUNTER — Other Ambulatory Visit: Payer: Self-pay

## 2021-03-12 DIAGNOSIS — E785 Hyperlipidemia, unspecified: Secondary | ICD-10-CM

## 2021-03-13 ENCOUNTER — Encounter: Payer: Self-pay | Admitting: Internal Medicine

## 2021-04-13 ENCOUNTER — Other Ambulatory Visit: Payer: Self-pay

## 2021-04-13 ENCOUNTER — Encounter: Payer: Self-pay | Admitting: Internal Medicine

## 2021-04-13 ENCOUNTER — Ambulatory Visit (INDEPENDENT_AMBULATORY_CARE_PROVIDER_SITE_OTHER): Payer: Medicare HMO | Admitting: Internal Medicine

## 2021-04-13 VITALS — BP 148/90 | HR 91 | Temp 98.6°F | Resp 94 | Ht 65.0 in | Wt 190.2 lb

## 2021-04-13 DIAGNOSIS — E559 Vitamin D deficiency, unspecified: Secondary | ICD-10-CM

## 2021-04-13 DIAGNOSIS — E034 Atrophy of thyroid (acquired): Secondary | ICD-10-CM | POA: Diagnosis not present

## 2021-04-13 DIAGNOSIS — I700 Atherosclerosis of aorta: Secondary | ICD-10-CM

## 2021-04-13 DIAGNOSIS — D751 Secondary polycythemia: Secondary | ICD-10-CM

## 2021-04-13 DIAGNOSIS — R739 Hyperglycemia, unspecified: Secondary | ICD-10-CM

## 2021-04-13 DIAGNOSIS — N2889 Other specified disorders of kidney and ureter: Secondary | ICD-10-CM

## 2021-04-13 DIAGNOSIS — E538 Deficiency of other specified B group vitamins: Secondary | ICD-10-CM | POA: Diagnosis not present

## 2021-04-13 DIAGNOSIS — I151 Hypertension secondary to other renal disorders: Secondary | ICD-10-CM | POA: Diagnosis not present

## 2021-04-13 MED ORDER — HYDROCHLOROTHIAZIDE 25 MG PO TABS: 25.0000 mg | ORAL_TABLET | Freq: Every day | ORAL | 3 refills | Status: DC

## 2021-04-13 MED ORDER — MEGARED OMEGA-3 KRILL OIL 500 MG PO CAPS: 1.0000 | ORAL_CAPSULE | Freq: Every morning | ORAL | 3 refills | Status: AC

## 2021-04-13 NOTE — Assessment & Plan Note (Addendum)
Cont w/meds -Exforge and HCTZ

## 2021-04-13 NOTE — Assessment & Plan Note (Addendum)
On Vit B12.  Monitor vitamin B12 levels

## 2021-04-13 NOTE — Progress Notes (Signed)
Subjective:  Patient ID: Kayla Aguirre, female    DOB: [birth year]  Age: 70 y.o. MRN: [id number]  CC: Medication Problem (Pt states HCTZ looks different than before & is concerned it is contaminated) and Results (Would like to discuss results of CT scan)   HPI Kayla Aguirre presents for HTN, dyslipidemia, vitamin B12 deficiency  Outpatient Medications Prior to Visit  Medication Sig Dispense Refill   amLODipine-valsartan (EXFORGE) 10-160 MG tablet Take 1 tablet by mouth daily. 90 tablet 3   Cholecalciferol (VITAMIN D3) 50 MCG (2000 UT) capsule Take 1 capsule (2,000 Units total) by mouth daily. 100 capsule 3   Cyanocobalamin (VITAMIN B-12) 1000 MCG SUBL Place 1 tablet (1,000 mcg total) under the tongue daily. 100 tablet 3   hydrochlorothiazide (HYDRODIURIL) 25 MG tablet Take 1 tablet (25 mg total) by mouth daily. 90 tablet 3   atorvastatin (LIPITOR) 10 MG tablet Take 1 tablet (10 mg total) by mouth daily. (Patient not taking: No sig reported) 90 tablet 3   cephALEXin (KEFLEX) 500 MG capsule Take 1 capsule (500 mg total) by mouth 2 (two) times daily. Use prn bladder infection (Patient not taking: Reported on 04/13/2021) 6 capsule 1   loratadine (CLARITIN) 10 MG tablet Take 1 tablet (10 mg total) by mouth daily. 100 tablet 3   No facility-administered medications prior to visit.    ROS: Review of Systems  Constitutional:  Negative for activity change, appetite change, chills, fatigue and unexpected weight change.  HENT:  Negative for congestion, mouth sores and sinus pressure.   Eyes:  Negative for visual disturbance.  Respiratory:  Negative for cough and chest tightness.   Gastrointestinal:  Negative for abdominal pain and nausea.  Genitourinary:  Negative for difficulty urinating, frequency and vaginal pain.  Musculoskeletal:  Negative for back pain and gait problem.  Skin:  Negative for pallor and rash.  Neurological:  Negative for dizziness, tremors, weakness, numbness and  headaches.  Psychiatric/Behavioral:  Negative for confusion, sleep disturbance and suicidal ideas.    Objective:  BP (!) 148/90 (BP Location: Left Arm, Patient Position: Sitting, Cuff Size: Normal)   Pulse 91   Temp 98.6 F (37 C) (Oral)   Resp (!) 94   Ht '5\' 5"'$  (1.651 m)   Wt 190 lb 3.2 oz (86.3 kg)   BMI 31.65 kg/m   BP Readings from Last 3 Encounters:  04/13/21 (!) 148/90  01/12/21 (!) 144/70  10/14/20 (!) 148/82    Wt Readings from Last 3 Encounters:  04/13/21 190 lb 3.2 oz (86.3 kg)  01/12/21 190 lb 6.4 oz (86.4 kg)  10/14/20 184 lb 6.4 oz (83.6 kg)    Physical Exam Constitutional:      General: She is not in acute distress.    Appearance: She is well-developed.  HENT:     Head: Normocephalic.     Right Ear: External ear normal.     Left Ear: External ear normal.     Nose: Nose normal.  Eyes:     General:        Right eye: No discharge.        Left eye: No discharge.     Conjunctiva/sclera: Conjunctivae normal.     Pupils: Pupils are equal, round, and reactive to light.  Neck:     Thyroid: No thyromegaly.     Vascular: No JVD.     Trachea: No tracheal deviation.  Cardiovascular:     Rate and Rhythm: Normal rate and regular rhythm.  Heart sounds: Normal heart sounds.  Pulmonary:     Effort: No respiratory distress.     Breath sounds: No stridor. No wheezing.  Abdominal:     General: Bowel sounds are normal. There is no distension.     Palpations: Abdomen is soft. There is no mass.     Tenderness: There is no abdominal tenderness. There is no guarding or rebound.  Musculoskeletal:        General: No tenderness.     Cervical back: Normal range of motion and neck supple.  Lymphadenopathy:     Cervical: No cervical adenopathy.  Skin:    Findings: No erythema or rash.  Neurological:     Cranial Nerves: No cranial nerve deficit.     Motor: No abnormal muscle tone.     Coordination: Coordination normal.     Deep Tendon Reflexes: Reflexes normal.   Psychiatric:        Behavior: Behavior normal.        Thought Content: Thought content normal.        Judgment: Judgment normal.    Lab Results  Component Value Date   WBC 9.1 01/12/2021   HGB 15.2 (H) 01/12/2021   HCT 44.2 01/12/2021   PLT 216.0 01/12/2021   GLUCOSE 98 10/14/2020   CHOL 268 (H) 03/24/2020   TRIG 233.0 (H) 03/24/2020   HDL 56.40 03/24/2020   LDLDIRECT 169.0 03/24/2020   LDLCALC 172 (H) 10/24/2017   ALT 31 10/14/2020   AST 25 10/14/2020   NA 138 10/14/2020   K 3.7 10/14/2020   CL 102 10/14/2020   CREATININE 0.93 10/14/2020   BUN 17 10/14/2020   CO2 28 10/14/2020   TSH 3.57 10/14/2020   HGBA1C 6.2 03/24/2020    CT CARDIAC SCORING (SELF PAY ONLY)  Addendum Date: 03/12/2021   ADDENDUM REPORT: 03/12/2021 15:40 CLINICAL DATA:  Cardiovascular Disease Risk stratification EXAM: Coronary Calcium Score TECHNIQUE: A gated, non-contrast computed tomography scan of the heart was performed using 71m slice thickness. Axial images were analyzed on a dedicated workstation. Calcium scoring of the coronary arteries was performed using the Agatston method. FINDINGS: Coronary arteries: Normal origins. Coronary Calcium Score: Left main: 0 Left anterior descending artery: 0 Left circumflex artery: 0 Right coronary artery: 0 Total: 0 Percentile: 0 Pericardium: Normal. Ascending Aorta: Normal caliber. [facility]card[facility]: [facility] IMPRESSION: 1. Coronary calcium score of 0. This is a low risk study. 2. Aortic atherosclerosis. RECOMMENDATIONS: Coronary artery calcium (CAC) score is a strong predictor of incident coronary heart disease (CHD) and provides predictive information beyond traditional risk factors. CAC scoring is reasonable to use in the decision to withhold, postpone, or initiate statin therapy in intermediate-risk or selected borderline-risk asymptomatic adults (age 69-75years and LDL-C >=70 to <190 mg/dL) who do not have diabetes or established  atherosclerotic cardiovascular disease (ASCVD).* In intermediate-risk (10-year ASCVD risk >=7.5% to <20%) adults or selected borderline-risk (10-year ASCVD risk >=5% to <7.5%) adults in whom a CAC score is measured for the purpose of making a treatment decision the following recommendations have been made: If CAC=0, it is reasonable to withhold statin therapy and reassess in 5 to 10 years, as long as higher risk conditions are absent (diabetes mellitus, family history of premature CHD in first degree relatives (males <55 years; females <65 years), cigarette smoking, or LDL >=190 mg/dL). If CAC is 1 to 99, it is reasonable to initiate statin therapy for patients >=552years of age. If CAC is >=100 or >=  75th percentile, it is reasonable to initiate statin therapy at any age. Cardiology referral should be considered for patients with CAC scores >=400 or >=75th percentile. *2018 AHA/ACC/AACVPR/AAPA/ABC/ACPM/ADA/AGS/APhA/ASPC/NLA/PCNA Guideline on the Management of Blood Cholesterol: A Report of the American College of Cardiology/American Heart Association Task Force on Clinical Practice Guidelines. J Am Coll Cardiol. 2019;73(24):3168-3209. Lyman Bishop, MD Electronically Signed   By: Pixie Casino M.D.   On: 03/12/2021 15:40   Result Date: 03/12/2021 EXAM: OVER-READ INTERPRETATION  CT CHEST The following report is an over-read performed by radiologist Dr. Vinnie Langton of [facility], [place] on 03/12/2021. This over-read does not include interpretation of cardiac or coronary anatomy or pathology. The coronary calcium score interpretation by the cardiologist is attached. COMPARISON:  None. FINDINGS: Atherosclerotic calcifications in the thoracic aorta. Tiny calcified granuloma in the medial segment of the right middle lobe. Within the visualized portions of the thorax there are no suspicious appearing pulmonary nodules or masses, there is no acute consolidative airspace disease, no pleural effusions, no  pneumothorax and no lymphadenopathy. Visualized portions of the upper abdomen are unremarkable. There are no aggressive appearing lytic or blastic lesions noted in the visualized portions of the skeleton. IMPRESSION: 1.  Aortic Atherosclerosis (ICD10-I70.0). Electronically Signed: By: Vinnie Langton M.D. On: 03/12/2021 15:04    Assessment & Plan:    Walker Kehr, MD

## 2021-04-13 NOTE — Assessment & Plan Note (Addendum)
On Vit D.  Compliance encouraged

## 2021-04-13 NOTE — Assessment & Plan Note (Addendum)
The patient is not willing to take statins.  Start [facility]

## 2021-04-26 ENCOUNTER — Encounter: Payer: Self-pay | Admitting: Internal Medicine

## 2021-04-26 NOTE — Assessment & Plan Note (Signed)
Continue to monitor CBC. 

## 2021-04-26 NOTE — Assessment & Plan Note (Signed)
Will monitor TSH

## 2021-08-19 ENCOUNTER — Other Ambulatory Visit: Payer: Self-pay

## 2021-08-19 ENCOUNTER — Other Ambulatory Visit (INDEPENDENT_AMBULATORY_CARE_PROVIDER_SITE_OTHER): Payer: Medicare HMO

## 2021-08-19 DIAGNOSIS — I151 Hypertension secondary to other renal disorders: Secondary | ICD-10-CM

## 2021-08-19 DIAGNOSIS — N2889 Other specified disorders of kidney and ureter: Secondary | ICD-10-CM | POA: Diagnosis not present

## 2021-08-19 DIAGNOSIS — R739 Hyperglycemia, unspecified: Secondary | ICD-10-CM

## 2021-08-19 DIAGNOSIS — E559 Vitamin D deficiency, unspecified: Secondary | ICD-10-CM | POA: Diagnosis not present

## 2021-08-19 DIAGNOSIS — D751 Secondary polycythemia: Secondary | ICD-10-CM | POA: Diagnosis not present

## 2021-08-19 DIAGNOSIS — E034 Atrophy of thyroid (acquired): Secondary | ICD-10-CM | POA: Diagnosis not present

## 2021-08-19 DIAGNOSIS — E538 Deficiency of other specified B group vitamins: Secondary | ICD-10-CM

## 2021-08-19 DIAGNOSIS — I700 Atherosclerosis of aorta: Secondary | ICD-10-CM

## 2021-08-19 LAB — CBC WITH DIFFERENTIAL/PLATELET
Basophils Absolute: 0 10*3/uL (ref 0.0–0.1)
Basophils Relative: 0.6 % (ref 0.0–3.0)
Eosinophils Absolute: 0.2 10*3/uL (ref 0.0–0.7)
Eosinophils Relative: 2.2 % (ref 0.0–5.0)
HCT: 43.6 % (ref 36.0–46.0)
Hemoglobin: 14.6 g/dL (ref 12.0–15.0)
Lymphocytes Relative: 33.1 % (ref 12.0–46.0)
Lymphs Abs: 2.5 10*3/uL (ref 0.7–4.0)
MCHC: 33.4 g/dL (ref 30.0–36.0)
MCV: 84.6 fl (ref 78.0–100.0)
Monocytes Absolute: 0.6 10*3/uL (ref 0.1–1.0)
Monocytes Relative: 7.4 % (ref 3.0–12.0)
Neutro Abs: 4.3 10*3/uL (ref 1.4–7.7)
Neutrophils Relative %: 56.7 % (ref 43.0–77.0)
Platelets: 241 10*3/uL (ref 150.0–400.0)
RBC: 5.15 Mil/uL — ABNORMAL HIGH (ref 3.87–5.11)
RDW: 13.5 % (ref 11.5–15.5)
WBC: 7.6 10*3/uL (ref 4.0–10.5)

## 2021-08-19 LAB — COMPREHENSIVE METABOLIC PANEL
ALT: 40 U/L — ABNORMAL HIGH (ref 0–35)
AST: 29 U/L (ref 0–37)
Albumin: 4.1 g/dL (ref 3.5–5.2)
Alkaline Phosphatase: 88 U/L (ref 39–117)
BUN: 17 mg/dL (ref 6–23)
CO2: 29 mEq/L (ref 19–32)
Calcium: 9.5 mg/dL (ref 8.4–10.5)
Chloride: 105 mEq/L (ref 96–112)
Creatinine, Ser: 0.93 mg/dL (ref 0.40–1.20)
GFR: 62.73 mL/min (ref >60.00)
Glucose, Bld: 92 mg/dL (ref 70–99)
Potassium: 4.2 mEq/L (ref 3.5–5.1)
Sodium: 139 mEq/L (ref 135–145)
Total Bilirubin: 0.9 mg/dL (ref 0.2–1.2)
Total Protein: 7.4 g/dL (ref 6.0–8.3)

## 2021-08-19 LAB — LIPID PANEL
Cholesterol: 246 mg/dL — ABNORMAL HIGH (ref 0–200)
HDL: 52.9 mg/dL (ref >39.00)
NonHDL: 192.71
Total CHOL/HDL Ratio: 5
Triglycerides: 211 mg/dL — ABNORMAL HIGH (ref 0.0–149.0)
VLDL: 42.2 mg/dL — ABNORMAL HIGH (ref 0.0–40.0)

## 2021-08-19 LAB — VITAMIN D 25 HYDROXY (VIT D DEFICIENCY, FRACTURES): VITD: 13.34 ng/mL — ABNORMAL LOW (ref 30.00–100.00)

## 2021-08-19 LAB — TSH: TSH: 3.22 u[IU]/mL (ref 0.35–5.50)

## 2021-08-19 LAB — VITAMIN B12: Vitamin B-12: 335 pg/mL (ref 211–911)

## 2021-08-19 LAB — LDL CHOLESTEROL, DIRECT: Direct LDL: 174 mg/dL

## 2021-08-19 LAB — HEMOGLOBIN A1C: Hgb A1c MFr Bld: 6 % (ref 4.6–6.5)

## 2021-08-25 ENCOUNTER — Encounter: Payer: Self-pay | Admitting: Internal Medicine

## 2021-08-25 ENCOUNTER — Ambulatory Visit (INDEPENDENT_AMBULATORY_CARE_PROVIDER_SITE_OTHER): Payer: Medicare HMO | Admitting: Internal Medicine

## 2021-08-25 ENCOUNTER — Other Ambulatory Visit: Payer: Self-pay

## 2021-08-25 DIAGNOSIS — E559 Vitamin D deficiency, unspecified: Secondary | ICD-10-CM

## 2021-08-25 DIAGNOSIS — I10 Essential (primary) hypertension: Secondary | ICD-10-CM | POA: Diagnosis not present

## 2021-08-25 DIAGNOSIS — E039 Hypothyroidism, unspecified: Secondary | ICD-10-CM | POA: Diagnosis not present

## 2021-08-25 DIAGNOSIS — E538 Deficiency of other specified B group vitamins: Secondary | ICD-10-CM | POA: Diagnosis not present

## 2021-08-25 DIAGNOSIS — E785 Hyperlipidemia, unspecified: Secondary | ICD-10-CM | POA: Diagnosis not present

## 2021-08-25 MED ORDER — EXFORGE HCT 10-160-12.5 MG PO TABS: 1.0000 | ORAL_TABLET | Freq: Every day | ORAL | 3 refills | Status: DC

## 2021-08-25 NOTE — Patient Instructions (Signed)
For a mild COVID-19 case - take zinc 50 mg a day for 1 week, vitamin C 1000 mg daily for 1 week, vitamin D2 50,000 units weekly for 2 months (unless  taking vitamin D daily already), an antioxidant Quercetin 500 mg twice a day for 1 week (if you can get it quick enough). Take Allegra or Benadryl.  Maintain good oral hydration and take Tylenol for high fever.  Call if problems. Isolate for 5 days, then wear a mask for 5 days per CDC.  

## 2021-08-25 NOTE — Assessment & Plan Note (Signed)
Monitor HCT

## 2021-08-25 NOTE — Assessment & Plan Note (Signed)
On B12 

## 2021-08-25 NOTE — Assessment & Plan Note (Signed)
Coronary calcium score of 0. This is a low risk study. On Krill oil

## 2021-08-25 NOTE — Progress Notes (Signed)
Subjective:  Patient ID: Kayla Aguirre, female    DOB: 1950-12-02  Age: 70 y.o. MRN: [id number]  CC: Annual Exam   HPI Kayla Aguirre presents for HTN, dyslipidemia, Vit D def. Kayla Aguirre has not been taking vitamin D regularly for unknown reason.  Outpatient Medications Prior to Visit  Medication Sig Dispense Refill   Cholecalciferol (VITAMIN D3) 50 MCG (2000 UT) capsule Take 1 capsule (2,000 Units total) by mouth daily. 100 capsule 3   Cyanocobalamin (VITAMIN B-12) 1000 MCG SUBL Place 1 tablet (1,000 mcg total) under the tongue daily. 100 tablet 3   MegaRed Omega-3 Krill Oil 500 MG CAPS Take 1 capsule by mouth every morning. 100 capsule 3   amLODipine-valsartan (EXFORGE) 10-160 MG tablet Take 1 tablet by mouth daily. 90 tablet 3   hydrochlorothiazide (HYDRODIURIL) 25 MG tablet Take 1 tablet (25 mg total) by mouth daily. 90 tablet 3   loratadine (CLARITIN) 10 MG tablet Take 1 tablet (10 mg total) by mouth daily. 100 tablet 3   atorvastatin (LIPITOR) 10 MG tablet Take 1 tablet (10 mg total) by mouth daily. (Patient not taking: Reported on 10/14/2020) 90 tablet 3   cephALEXin (KEFLEX) 500 MG capsule Take 1 capsule (500 mg total) by mouth 2 (two) times daily. Use prn bladder infection (Patient not taking: Reported on 04/13/2021) 6 capsule 1   No facility-administered medications prior to visit.    ROS: Review of Systems  Constitutional:  Negative for activity change, appetite change, chills, fatigue and unexpected weight change.  HENT:  Negative for congestion, mouth sores and sinus pressure.   Eyes:  Negative for visual disturbance.  Respiratory:  Negative for cough and chest tightness.   Gastrointestinal:  Negative for abdominal pain and nausea.  Genitourinary:  Negative for difficulty urinating, frequency and vaginal pain.  Musculoskeletal:  Negative for back pain and gait problem.  Skin:  Negative for pallor and rash.  Neurological:  Negative for dizziness, tremors, weakness,  numbness and headaches.  Psychiatric/Behavioral:  Negative for confusion and sleep disturbance. The patient is not nervous/anxious.    Objective:  BP 140/78 (BP Location: Left Arm)   Pulse 96   Temp 97.7 F (36.5 C) (Oral)   Ht 5\' 5"  (1.651 m)   Wt 192 lb 3.2 oz (87.2 kg)   SpO2 96%   BMI 31.98 kg/m   BP Readings from Last 3 Encounters:  08/25/21 140/78  04/13/21 (!) 148/90  01/12/21 (!) 144/70    Wt Readings from Last 3 Encounters:  08/25/21 192 lb 3.2 oz (87.2 kg)  04/13/21 190 lb 3.2 oz (86.3 kg)  01/12/21 190 lb 6.4 oz (86.4 kg)    Physical Exam Constitutional:      General: She is not in acute distress.    Appearance: She is well-developed.  HENT:     Head: Normocephalic.     Right Ear: External ear normal.     Left Ear: External ear normal.     Nose: Nose normal.  Eyes:     General:        Right eye: No discharge.        Left eye: No discharge.     Conjunctiva/sclera: Conjunctivae normal.     Pupils: Pupils are equal, round, and reactive to light.  Neck:     Thyroid: No thyromegaly.     Vascular: No JVD.     Trachea: No tracheal deviation.  Cardiovascular:     Rate and Rhythm: Normal rate and regular rhythm.  Heart sounds: Normal heart sounds.  Pulmonary:     Effort: No respiratory distress.     Breath sounds: No stridor. No wheezing.  Abdominal:     General: Bowel sounds are normal. There is no distension.     Palpations: Abdomen is soft. There is no mass.     Tenderness: There is no abdominal tenderness. There is no guarding or rebound.  Musculoskeletal:        General: No tenderness.     Cervical back: Normal range of motion and neck supple. No rigidity.  Lymphadenopathy:     Cervical: No cervical adenopathy.  Skin:    Findings: No erythema or rash.  Neurological:     Mental Status: She is oriented to person, place, and time.     Cranial Nerves: No cranial nerve deficit.     Motor: No abnormal muscle tone.     Coordination: Coordination  normal.     Deep Tendon Reflexes: Reflexes normal.  Psychiatric:        Behavior: Behavior normal.        Thought Content: Thought content normal.        Judgment: Judgment normal.    Lab Results  Component Value Date   WBC 7.6 08/19/2021   HGB 14.6 08/19/2021   HCT 43.6 08/19/2021   PLT 241.0 08/19/2021   GLUCOSE 92 08/19/2021   CHOL 246 (H) 08/19/2021   TRIG 211.0 (H) 08/19/2021   HDL 52.90 08/19/2021   LDLDIRECT 174.0 08/19/2021   LDLCALC 172 (H) 10/24/2017   ALT 40 (H) 08/19/2021   AST 29 08/19/2021   NA 139 08/19/2021   K 4.2 08/19/2021   CL 105 08/19/2021   CREATININE 0.93 08/19/2021   BUN 17 08/19/2021   CO2 29 08/19/2021   TSH 3.22 08/19/2021   HGBA1C 6.0 08/19/2021    CT CARDIAC SCORING (SELF PAY ONLY)  Addendum Date: 03/12/2021   ADDENDUM REPORT: 03/12/2021 15:40 CLINICAL DATA:  Cardiovascular Disease Risk stratification EXAM: Coronary Calcium Score TECHNIQUE: A gated, non-contrast computed tomography scan of the heart was performed using 40mm slice thickness. Axial images were analyzed on a dedicated workstation. Calcium scoring of the coronary arteries was performed using the Agatston method. FINDINGS: Coronary arteries: Normal origins. Coronary Calcium Score: Left main: 0 Left anterior descending artery: 0 Left circumflex artery: 0 Right coronary artery: 0 Total: 0 Percentile: 0 Pericardium: Normal. Ascending Aorta: Normal caliber. Non-cardiac: See separate report from [facility]. IMPRESSION: 1. Coronary calcium score of 0. This is a low risk study. 2. Aortic atherosclerosis. RECOMMENDATIONS: Coronary artery calcium (CAC) score is a strong predictor of incident coronary heart disease (CHD) and provides predictive information beyond traditional risk factors. CAC scoring is reasonable to use in the decision to withhold, postpone, or initiate statin therapy in intermediate-risk or selected borderline-risk asymptomatic adults (age 25-75 years and LDL-C >=70 to <190  mg/dL) who do not have diabetes or established atherosclerotic cardiovascular disease (ASCVD).* In intermediate-risk (10-year ASCVD risk >=7.5% to <20%) adults or selected borderline-risk (10-year ASCVD risk >=5% to <7.5%) adults in whom a CAC score is measured for the purpose of making a treatment decision the following recommendations have been made: If CAC=0, it is reasonable to withhold statin therapy and reassess in 5 to 10 years, as long as higher risk conditions are absent (diabetes mellitus, family history of premature CHD in first degree relatives (males <55 years; females <65 years), cigarette smoking, or LDL >=190 mg/dL). If CAC is 1 to 99, it is  reasonable to initiate statin therapy for patients >=80 years of age. If CAC is >=100 or >=75th percentile, it is reasonable to initiate statin therapy at any age. Cardiology referral should be considered for patients with CAC scores >=400 or >=75th percentile. *2018 AHA/ACC/AACVPR/AAPA/ABC/ACPM/ADA/AGS/APhA/ASPC/NLA/PCNA Guideline on the Management of Blood Cholesterol: A Report of the American College of Cardiology/American Heart Association Task Force on Clinical Practice Guidelines. J Am Coll Cardiol. 2019;73(24):3168-3209. Lyman Bishop, MD Electronically Signed   By: Pixie Casino M.D.   On: 03/12/2021 15:40   Result Date: 03/12/2021 EXAM: OVER-READ INTERPRETATION  CT CHEST The following report is an over-read performed by radiologist Dr. Vinnie Langton of [facility], [place] on 03/12/2021. This over-read does not include interpretation of cardiac or coronary anatomy or pathology. The coronary calcium score interpretation by the cardiologist is attached. COMPARISON:  None. FINDINGS: Atherosclerotic calcifications in the thoracic aorta. Tiny calcified granuloma in the medial segment of the right middle lobe. Within the visualized portions of the thorax there are no suspicious appearing pulmonary nodules or masses, there is no acute consolidative  airspace disease, no pleural effusions, no pneumothorax and no lymphadenopathy. Visualized portions of the upper abdomen are unremarkable. There are no aggressive appearing lytic or blastic lesions noted in the visualized portions of the skeleton. IMPRESSION: 1.  Aortic Atherosclerosis (ICD10-I70.0). Electronically Signed: By: Vinnie Langton M.D. On: 03/12/2021 15:04    Assessment & Plan:   Problem List Items Addressed This Visit     B12 deficiency    On B12      Dyslipidemia    Coronary calcium score of 0. This is a low risk study. On Krill oil      HTN (hypertension)    Change on Exforge HCT      Relevant Medications   EXFORGE HCT 10-160-12.5 MG TABS   Hypothyroidism    Monitor HCT      Vitamin D deficiency    Low vit D Re-start Vit D Risks associated with treatment noncompliance were discussed. Compliance was encouraged.          Meds ordered this encounter  Medications   EXFORGE HCT 10-160-12.5 MG TABS    Sig: Take 1 tablet by mouth daily.    Dispense:  90 tablet    Refill:  3       Follow-up: Return in about 4 months (around 12/24/2021) for a follow-up visit.  Walker Kehr, MD

## 2021-08-25 NOTE — Assessment & Plan Note (Signed)
Change on Exforge HCT

## 2021-08-25 NOTE — Assessment & Plan Note (Signed)
Low vit D Re-start Vit D Risks associated with treatment noncompliance were discussed. Compliance was encouraged.

## 2021-10-21 ENCOUNTER — Ambulatory Visit: Payer: Medicare HMO

## 2021-11-23 ENCOUNTER — Other Ambulatory Visit: Payer: Self-pay

## 2021-11-23 ENCOUNTER — Encounter: Payer: Self-pay | Admitting: Internal Medicine

## 2021-11-23 ENCOUNTER — Ambulatory Visit (INDEPENDENT_AMBULATORY_CARE_PROVIDER_SITE_OTHER): Payer: Medicare HMO | Admitting: Internal Medicine

## 2021-11-23 VITALS — BP 132/82 | HR 99 | Temp 98.9°F | Ht 65.0 in | Wt 184.0 lb

## 2021-11-23 DIAGNOSIS — Z91199 Patient's noncompliance with other medical treatment and regimen due to unspecified reason: Secondary | ICD-10-CM | POA: Diagnosis not present

## 2021-11-23 DIAGNOSIS — I10 Essential (primary) hypertension: Secondary | ICD-10-CM | POA: Diagnosis not present

## 2021-11-23 DIAGNOSIS — R739 Hyperglycemia, unspecified: Secondary | ICD-10-CM | POA: Diagnosis not present

## 2021-11-23 DIAGNOSIS — K219 Gastro-esophageal reflux disease without esophagitis: Secondary | ICD-10-CM | POA: Diagnosis not present

## 2021-11-23 DIAGNOSIS — E538 Deficiency of other specified B group vitamins: Secondary | ICD-10-CM | POA: Diagnosis not present

## 2021-11-23 DIAGNOSIS — E039 Hypothyroidism, unspecified: Secondary | ICD-10-CM | POA: Diagnosis not present

## 2021-11-23 DIAGNOSIS — E785 Hyperlipidemia, unspecified: Secondary | ICD-10-CM | POA: Diagnosis not present

## 2021-11-23 MED ORDER — AMLODIPINE-VALSARTAN-HCTZ 10-160-12.5 MG PO TABS: 1.0000 | ORAL_TABLET | Freq: Every day | ORAL | 3 refills | Status: DC

## 2021-11-23 MED ORDER — FAMOTIDINE 40 MG PO TABS: 40.0000 mg | ORAL_TABLET | Freq: Every day | ORAL | 3 refills | Status: DC

## 2021-11-23 MED ORDER — CEPHALEXIN 500 MG PO CAPS: 500.0000 mg | ORAL_CAPSULE | Freq: Two times a day (BID) | ORAL | 2 refills | Status: DC

## 2021-11-23 NOTE — Assessment & Plan Note (Signed)
Doing better.   

## 2021-11-23 NOTE — Assessment & Plan Note (Signed)
Use Pepcid ?

## 2021-11-23 NOTE — Patient Instructions (Addendum)
For a mild COVID-19 case - take zinc 50 mg a day for 1 week, vitamin C 1000 mg daily for 1 week, vitamin D2 50,000 units weekly for 2 months (unless  taking vitamin D daily already), an antioxidant Quercetin 500 mg twice a day for 1 week (if you can get it quick enough). Take Allegra or Benadryl.  Maintain good oral hydration and take Tylenol for high fever.  ?Call if problems. Isolate for 5 days, then wear a mask for 5 days per CDC. ? ? ?You can use over-the-counter  "cold" medicines  such as "Afrin" nasal spray for nasal congestion as directed. Use " Delsym" or" Robitussin" cough syrup varietis for cough.  You can use plain "Tylenol" or "Advil" for fever, chills and achyness. Use Halls or Ricola cough drops. ? ? "Common cold" symptoms are usually triggered by a virus.  The antibiotics are usually not necessary. On average, a" viral cold" illness would take 4-7 days to resolve. ? ? Please, make an appointment if you are not better or if you're worse. ? ? ? ?Kayla Aguirre  ?

## 2021-11-23 NOTE — Assessment & Plan Note (Signed)
Risks associated with treatment noncompliance were discussed. Compliance was encouraged. 

## 2021-11-23 NOTE — Assessment & Plan Note (Signed)
Exforge and HCTZ ?Chronic  ?

## 2021-11-23 NOTE — Progress Notes (Signed)
Subjective:  Patient ID: Kayla Aguirre, female    DOB: [birth year]  Age: 71 y.o. MRN: [id number]  CC: No chief complaint on file.   HPI [place] presents for Vit B12 def, HTN, occ cystitis   Outpatient Medications Prior to Visit  Medication Sig Dispense Refill   Cholecalciferol (VITAMIN D3) 50 MCG (2000 UT) capsule Take 1 capsule (2,000 Units total) by mouth daily. 100 capsule 3   Cyanocobalamin (VITAMIN B-12) 1000 MCG SUBL Place 1 tablet (1,000 mcg total) under the tongue daily. 100 tablet 3   MegaRed Omega-3 Krill Oil 500 MG CAPS Take 1 capsule by mouth every morning. 100 capsule 3   EXFORGE HCT 10-160-12.5 MG TABS Take 1 tablet by mouth daily. 90 tablet 3   loratadine (CLARITIN) 10 MG tablet Take 1 tablet (10 mg total) by mouth daily. 100 tablet 3   No facility-administered medications prior to visit.    ROS: Review of Systems  Constitutional:  Negative for activity change, appetite change, chills, fatigue and unexpected weight change.  HENT:  Negative for congestion, mouth sores and sinus pressure.   Eyes:  Negative for visual disturbance.  Respiratory:  Negative for cough and chest tightness.   Gastrointestinal:  Negative for abdominal pain and nausea.  Genitourinary:  Negative for difficulty urinating, frequency and vaginal pain.  Musculoskeletal:  Negative for back pain and gait problem.  Skin:  Negative for pallor and rash.  Neurological:  Negative for dizziness, tremors, weakness, numbness and headaches.  Psychiatric/Behavioral:  Negative for confusion and sleep disturbance.    Objective:  BP 132/82 (BP Location: Left Arm, Patient Position: Sitting, Cuff Size: Large)    Pulse 99    Temp 98.9 F (37.2 C) (Oral)    Ht '5\' 5"'$  (1.651 m)    Wt 184 lb (83.5 kg)    SpO2 96%    BMI 30.62 kg/m   BP Readings from Last 3 Encounters:  11/23/21 132/82  08/25/21 140/78  04/13/21 (!) 148/90    Wt Readings from Last 3 Encounters:  11/23/21 184 lb (83.5 kg)   08/25/21 192 lb 3.2 oz (87.2 kg)  04/13/21 190 lb 3.2 oz (86.3 kg)    Physical Exam Constitutional:      General: She is not in acute distress.    Appearance: She is well-developed.  HENT:     Head: Normocephalic.     Right Ear: External ear normal.     Left Ear: External ear normal.     Nose: Nose normal.  Eyes:     General:        Right eye: No discharge.        Left eye: No discharge.     Conjunctiva/sclera: Conjunctivae normal.     Pupils: Pupils are equal, round, and reactive to light.  Neck:     Thyroid: No thyromegaly.     Vascular: No JVD.     Trachea: No tracheal deviation.  Cardiovascular:     Rate and Rhythm: Normal rate and regular rhythm.     Heart sounds: Normal heart sounds.  Pulmonary:     Effort: No respiratory distress.     Breath sounds: No stridor. No wheezing.  Abdominal:     General: Bowel sounds are normal. There is no distension.     Palpations: Abdomen is soft. There is no mass.     Tenderness: There is no abdominal tenderness. There is no guarding or rebound.  Musculoskeletal:  General: No tenderness.     Cervical back: Normal range of motion and neck supple. No rigidity.  Lymphadenopathy:     Cervical: No cervical adenopathy.  Skin:    Findings: No erythema or rash.  Neurological:     Cranial Nerves: No cranial nerve deficit.     Motor: No abnormal muscle tone.     Coordination: Coordination normal.     Deep Tendon Reflexes: Reflexes normal.  Psychiatric:        Behavior: Behavior normal.        Thought Content: Thought content normal.        Judgment: Judgment normal.    Lab Results  Component Value Date   WBC 7.6 08/19/2021   HGB 14.6 08/19/2021   HCT 43.6 08/19/2021   PLT 241.0 08/19/2021   GLUCOSE 92 08/19/2021   CHOL 246 (H) 08/19/2021   TRIG 211.0 (H) 08/19/2021   HDL 52.90 08/19/2021   LDLDIRECT 174.0 08/19/2021   LDLCALC 172 (H) 10/24/2017   ALT 40 (H) 08/19/2021   AST 29 08/19/2021   NA 139 08/19/2021   K  4.2 08/19/2021   CL 105 08/19/2021   CREATININE 0.93 08/19/2021   BUN 17 08/19/2021   CO2 29 08/19/2021   TSH 3.22 08/19/2021   HGBA1C 6.0 08/19/2021    CT CARDIAC SCORING (SELF PAY ONLY)  Addendum Date: 03/12/2021   ADDENDUM REPORT: 03/12/2021 15:40 CLINICAL DATA:  Cardiovascular Disease Risk stratification EXAM: Coronary Calcium Score TECHNIQUE: A gated, non-contrast computed tomography scan of the heart was performed using 72m slice thickness. Axial images were analyzed on a dedicated workstation. Calcium scoring of the coronary arteries was performed using the Agatston method. FINDINGS: Coronary arteries: Normal origins. Coronary Calcium Score: Left main: 0 Left anterior descending artery: 0 Left circumflex artery: 0 Right coronary artery: 0 Total: 0 Percentile: 0 Pericardium: Normal. Ascending Aorta: Normal caliber. [facility]-cardiac: [facility]. IMPRESSION: 1. Coronary calcium score of 0. This is a low risk study. 2. Aortic atherosclerosis. RECOMMENDATIONS: Coronary artery calcium (CAC) score is a strong predictor of incident coronary heart disease (CHD) and provides predictive information beyond traditional risk factors. CAC scoring is reasonable to use in the decision to withhold, postpone, or initiate statin therapy in intermediate-risk or selected borderline-risk asymptomatic adults (age 70-75years and LDL-C >=70 to <190 mg/dL) who do not have diabetes or established atherosclerotic cardiovascular disease (ASCVD).* In intermediate-risk (10-year ASCVD risk >=7.5% to <20%) adults or selected borderline-risk (10-year ASCVD risk >=5% to <7.5%) adults in whom a CAC score is measured for the purpose of making a treatment decision the following recommendations have been made: If CAC=0, it is reasonable to withhold statin therapy and reassess in 5 to 10 years, as long as higher risk conditions are absent (diabetes mellitus, family history of premature CHD in first degree  relatives (males <55 years; females <65 years), cigarette smoking, or LDL >=190 mg/dL). If CAC is 1 to 99, it is reasonable to initiate statin therapy for patients >=510years of age. If CAC is >=100 or >=75th percentile, it is reasonable to initiate statin therapy at any age. Cardiology referral should be considered for patients with CAC scores >=400 or >=75th percentile. *2018 AHA/ACC/AACVPR/AAPA/ABC/ACPM/ADA/AGS/APhA/ASPC/NLA/PCNA Guideline on the Management of Blood Cholesterol: A Report of the American College of Cardiology/American Heart Association Task Force on Clinical Practice Guidelines. J Am Coll Cardiol. 2019;73(24):3168-3209. KLyman Bishop MD Electronically Signed   By: KPixie CasinoM.D.   On: 03/12/2021 15:40  Result Date: 03/12/2021 EXAM: OVER-READ INTERPRETATION  CT CHEST The following report is an over-read performed by radiologist Dr. Vinnie Langton of [facility], [place] on 03/12/2021. This over-read does not include interpretation of cardiac or coronary anatomy or pathology. The coronary calcium score interpretation by the cardiologist is attached. COMPARISON:  None. FINDINGS: Atherosclerotic calcifications in the thoracic aorta. Tiny calcified granuloma in the medial segment of the right middle lobe. Within the visualized portions of the thorax there are no suspicious appearing pulmonary nodules or masses, there is no acute consolidative airspace disease, no pleural effusions, no pneumothorax and no lymphadenopathy. Visualized portions of the upper abdomen are unremarkable. There are no aggressive appearing lytic or blastic lesions noted in the visualized portions of the skeleton. IMPRESSION: 1.  Aortic Atherosclerosis (ICD10-I70.0). Electronically Signed: By: Vinnie Langton M.D. On: 03/12/2021 15:04    Assessment & Plan:   Problem List Items Addressed This Visit     B12 deficiency    Risks associated with treatment noncompliance were discussed. Compliance was encouraged.       Dyslipidemia - Primary   GERD (gastroesophageal reflux disease)    Use Pepcid      Relevant Medications   famotidine (PEPCID) 40 MG tablet   HTN (hypertension)    Exforge and HCTZ Chronic       Relevant Medications   amLODIPine-Valsartan-HCTZ (EXFORGE HCT) 10-160-12.5 MG TABS   Hypothyroidism   Poor compliance    Doing better      Other Visit Diagnoses     Hyperglycemia       Relevant Orders   TSH   CBC with Differential/Platelet   Comprehensive metabolic panel   Urinalysis   Hemoglobin A1c         Meds ordered this encounter  Medications   amLODIPine-Valsartan-HCTZ (EXFORGE HCT) 10-160-12.5 MG TABS    Sig: Take 1 tablet by mouth daily.    Dispense:  90 tablet    Refill:  3    Generic   cephALEXin (KEFLEX) 500 MG capsule    Sig: Take 1 capsule (500 mg total) by mouth 2 (two) times daily. Use prn bladder infection    Dispense:  12 capsule    Refill:  2    For UTI   famotidine (PEPCID) 40 MG tablet    Sig: Take 1 tablet (40 mg total) by mouth daily.    Dispense:  90 tablet    Refill:  3      Follow-up: Return in about 3 months (around 02/23/2022) for a follow-up visit.  Walker Kehr, MD

## 2022-02-04 ENCOUNTER — Other Ambulatory Visit: Payer: Self-pay

## 2022-02-04 ENCOUNTER — Emergency Department (HOSPITAL_COMMUNITY)
Admission: EM | Admit: 2022-02-04 | Discharge: 2022-02-04 | Disposition: A | Discharge status: Home / Self Care | Payer: Medicare HMO | Attending: Emergency Medicine | Admitting: Emergency Medicine

## 2022-02-04 ENCOUNTER — Encounter (HOSPITAL_COMMUNITY): Payer: Self-pay

## 2022-02-04 DIAGNOSIS — Z79899 Other long term (current) drug therapy: Secondary | ICD-10-CM | POA: Insufficient documentation

## 2022-02-04 DIAGNOSIS — I10 Essential (primary) hypertension: Secondary | ICD-10-CM | POA: Diagnosis not present

## 2022-02-04 DIAGNOSIS — R030 Elevated blood-pressure reading, without diagnosis of hypertension: Secondary | ICD-10-CM | POA: Diagnosis not present

## 2022-02-04 LAB — CBC WITH DIFFERENTIAL/PLATELET
Abs Immature Granulocytes: 0.03 10*3/uL (ref 0.00–0.07)
Basophils Absolute: 0.1 10*3/uL (ref 0.0–0.1)
Basophils Relative: 1 %
Eosinophils Absolute: 0.3 10*3/uL (ref 0.0–0.5)
Eosinophils Relative: 4 %
HCT: 48.9 % — ABNORMAL HIGH (ref 36.0–46.0)
Hemoglobin: 16.2 g/dL — ABNORMAL HIGH (ref 12.0–15.0)
Immature Granulocytes: 0 %
Lymphocytes Relative: 38 %
Lymphs Abs: 3.4 10*3/uL (ref 0.7–4.0)
MCH: 28.2 pg (ref 26.0–34.0)
MCHC: 33.1 g/dL (ref 30.0–36.0)
MCV: 85 fL (ref 80.0–100.0)
Monocytes Absolute: 0.7 10*3/uL (ref 0.1–1.0)
Monocytes Relative: 8 %
Neutro Abs: 4.4 10*3/uL (ref 1.7–7.7)
Neutrophils Relative %: 49 %
Platelets: 243 10*3/uL (ref 150–400)
RBC: 5.75 MIL/uL — ABNORMAL HIGH (ref 3.87–5.11)
RDW: 12.9 % (ref 11.5–15.5)
WBC: 8.9 10*3/uL (ref 4.0–10.5)
nRBC: 0 % (ref 0.0–0.2)

## 2022-02-04 LAB — COMPREHENSIVE METABOLIC PANEL
ALT: 43 U/L (ref 0–44)
AST: 40 U/L (ref 15–41)
Albumin: 4.1 g/dL (ref 3.5–5.0)
Alkaline Phosphatase: 77 U/L (ref 38–126)
Anion gap: 9 (ref 5–15)
BUN: 17 mg/dL (ref 8–23)
CO2: 24 mmol/L (ref 22–32)
Calcium: 9.5 mg/dL (ref 8.9–10.3)
Chloride: 106 mmol/L (ref 98–111)
Creatinine, Ser: 1 mg/dL (ref 0.44–1.00)
GFR, Estimated: >60 mL/min (ref >60)
Glucose, Bld: 156 mg/dL — ABNORMAL HIGH (ref 70–99)
Potassium: 4.2 mmol/L (ref 3.5–5.1)
Sodium: 139 mmol/L (ref 135–145)
Total Bilirubin: 1.9 mg/dL — ABNORMAL HIGH (ref 0.3–1.2)
Total Protein: 7.9 g/dL (ref 6.5–8.1)

## 2022-02-04 NOTE — ED Notes (Signed)
Patient said she felt like she was going unconscious. RN walked into room and her heart rate was 34. Patient moved to Recess.

## 2022-02-04 NOTE — Discharge Instructions (Signed)
Continue home medications as previously prescribed.  Keep a record of your blood pressure at home and take this with you to your next doctor's appointment.  Return to the emergency department if you develop severe headache, chest pain, difficulty breathing, sustained blood pressures greater than 446 systolic, or for other new and concerning symptoms.

## 2022-02-04 NOTE — ED Provider Notes (Signed)
[place] DEPT Provider Note   CSN: [id number] Arrival date & time: 02/04/22  0511     History  Chief Complaint  Patient presents with   Hypertension    Kayla Aguirre is a 71 y.o. female.  Patient is a 71 year old female with history of hypertension and GERD.  Patient presenting with complaints of elevated blood pressure.  Patient woke early this morning to catch a flight.  While she was packing, she checked her blood pressure and it was 180/108.  She presents to the ER for evaluation of this.  She denies any symptoms such as headache, chest pain, or shortness of breath.  She reports taking an extra dose of Exforge prior to coming here.  Blood pressure is now 125/75 during my exam.  Patient has no complaints or symptoms otherwise.  The history is provided by the patient.      Home Medications Prior to Admission medications   Medication Sig Start Date End Date Taking? Authorizing Provider  amLODIPine-Valsartan-HCTZ (EXFORGE HCT) 10-160-12.5 MG TABS Take 1 tablet by mouth daily. 11/23/21   Plotnikov, Evie Lacks, MD  cephALEXin (KEFLEX) 500 MG capsule Take 1 capsule (500 mg total) by mouth 2 (two) times daily. Use prn bladder infection 11/23/21   Plotnikov, Evie Lacks, MD  Cholecalciferol (VITAMIN D3) 50 MCG (2000 UT) capsule Take 1 capsule (2,000 Units total) by mouth daily. 10/01/19   Plotnikov, Evie Lacks, MD  Cyanocobalamin (VITAMIN B-12) 1000 MCG SUBL Place 1 tablet (1,000 mcg total) under the tongue daily. 10/01/19   Plotnikov, Evie Lacks, MD  famotidine (PEPCID) 40 MG tablet Take 1 tablet (40 mg total) by mouth daily. 11/23/21   Plotnikov, Evie Lacks, MD  MegaRed Omega-3 Krill Oil 500 MG CAPS Take 1 capsule by mouth every morning. 04/13/21   Plotnikov, Evie Lacks, MD      Allergies    Erythromycin, Erythromycin base, and Hydrochlorothiazide    Review of Systems   Review of Systems  All other systems reviewed and are negative.  Physical Exam Updated  Vital Signs BP 125/75   Pulse 73   Temp 98.6 F (37 C) (Oral)   Resp 20   Ht '5\' 5"'$  (1.651 m)   Wt 84 kg   SpO2 92%   BMI 30.82 kg/m  Physical Exam Vitals and nursing note reviewed.  Constitutional:      General: She is not in acute distress.    Appearance: She is well-developed. She is not diaphoretic.  HENT:     Head: Normocephalic and atraumatic.  Cardiovascular:     Rate and Rhythm: Normal rate and regular rhythm.     Heart sounds: No murmur heard.   No friction rub. No gallop.  Pulmonary:     Effort: Pulmonary effort is normal. No respiratory distress.     Breath sounds: Normal breath sounds. No wheezing.  Abdominal:     General: Bowel sounds are normal. There is no distension.     Palpations: Abdomen is soft.     Tenderness: There is no abdominal tenderness.  Musculoskeletal:        General: Normal range of motion.     Cervical back: Normal range of motion and neck supple.  Skin:    General: Skin is warm and dry.  Neurological:     General: No focal deficit present.     Mental Status: She is alert and oriented to person, place, and time.    ED Results / Procedures / Treatments  Labs (all labs ordered are listed, but only abnormal results are displayed) Labs Reviewed  CBC WITH DIFFERENTIAL/PLATELET - Abnormal; Notable for the following components:      Result Value   RBC 5.75 (*)    Hemoglobin 16.2 (*)    HCT 48.9 (*)    All other components within normal limits  COMPREHENSIVE METABOLIC PANEL - Abnormal; Notable for the following components:   Glucose, Bld 156 (*)    Total Bilirubin 1.9 (*)    All other components within normal limits    EKG EKG Interpretation  Date/Time:  Friday Feb 04 2022 05:22:04 EDT Ventricular Rate:  101 PR Interval:  150 QRS Duration: 94 QT Interval:  338 QTC Calculation: 439 R Axis:   -79 Text Interpretation: Sinus tachycardia Left anterior fascicular block Abnormal R-wave progression, late transition No significant change  from 10/16/2002 Confirmed by Veryl Speak [phone]) on 02/04/2022 6:17:29 AM  Radiology No results found.  Procedures Procedures    Medications Ordered in ED Medications - No data to display  ED Course/ Medical Decision Making/ A&P  Patient presenting here with complaints of elevated blood pressure.  She took an extra dose of Exforge prior to coming here and her blood pressure has now normalized.  She is symptom-free and laboratory studies and EKG are unremarkable.  I do not feel as though any further work-up is indicated and feel as though patient can safely be discharged with outpatient follow-up.  Final Clinical Impression(s) / ED Diagnoses Final diagnoses:  None    Rx / DC Orders ED Discharge Orders     None         Veryl Speak, MD 02/04/22 [phone]

## 2022-02-04 NOTE — ED Triage Notes (Addendum)
Patient said she took her BP and it was 181/122. She took amlodipine at 11pm and 4am. No headache. No blurred vision. A little nauseous and sweaty.

## 2022-02-04 NOTE — ED Notes (Signed)
Pt states understanding of dc instructions, importance of follow up. Pt denies questions or concerns and declined transportation assistance upon dc. Pt ambulated w/ a steady gait w/o need for assistance. No belongings left in room upon dc. ? ?

## 2022-02-22 ENCOUNTER — Other Ambulatory Visit (INDEPENDENT_AMBULATORY_CARE_PROVIDER_SITE_OTHER): Payer: Medicare HMO

## 2022-02-22 DIAGNOSIS — R739 Hyperglycemia, unspecified: Secondary | ICD-10-CM

## 2022-02-22 LAB — CBC WITH DIFFERENTIAL/PLATELET
Basophils Absolute: 0.1 10*3/uL (ref 0.0–0.1)
Basophils Relative: 0.6 % (ref 0.0–3.0)
Eosinophils Absolute: 0.3 10*3/uL (ref 0.0–0.7)
Eosinophils Relative: 3.2 % (ref 0.0–5.0)
HCT: 43.7 % (ref 36.0–46.0)
Hemoglobin: 14.6 g/dL (ref 12.0–15.0)
Lymphocytes Relative: 31.3 % (ref 12.0–46.0)
Lymphs Abs: 2.5 10*3/uL (ref 0.7–4.0)
MCHC: 33.5 g/dL (ref 30.0–36.0)
MCV: 84.8 fl (ref 78.0–100.0)
Monocytes Absolute: 0.6 10*3/uL (ref 0.1–1.0)
Monocytes Relative: 7.8 % (ref 3.0–12.0)
Neutro Abs: 4.5 10*3/uL (ref 1.4–7.7)
Neutrophils Relative %: 57.1 % (ref 43.0–77.0)
Platelets: 217 10*3/uL (ref 150.0–400.0)
RBC: 5.16 Mil/uL — ABNORMAL HIGH (ref 3.87–5.11)
RDW: 13.6 % (ref 11.5–15.5)
WBC: 8 10*3/uL (ref 4.0–10.5)

## 2022-02-22 LAB — URINALYSIS
Bilirubin Urine: NEGATIVE
Hgb urine dipstick: NEGATIVE
Ketones, ur: NEGATIVE
Leukocytes,Ua: NEGATIVE
Nitrite: NEGATIVE
Specific Gravity, Urine: 1.015 (ref 1.000–1.030)
Total Protein, Urine: NEGATIVE
Urine Glucose: NEGATIVE
Urobilinogen, UA: 0.2 (ref 0.0–1.0)
pH: 5.5 (ref 5.0–8.0)

## 2022-02-22 LAB — HEMOGLOBIN A1C: Hgb A1c MFr Bld: 6.5 % (ref 4.6–6.5)

## 2022-02-22 LAB — COMPREHENSIVE METABOLIC PANEL
ALT: 45 U/L — ABNORMAL HIGH (ref 0–35)
AST: 30 U/L (ref 0–37)
Albumin: 4 g/dL (ref 3.5–5.2)
Alkaline Phosphatase: 72 U/L (ref 39–117)
BUN: 18 mg/dL (ref 6–23)
CO2: 25 mEq/L (ref 19–32)
Calcium: 9.2 mg/dL (ref 8.4–10.5)
Chloride: 103 mEq/L (ref 96–112)
Creatinine, Ser: 0.86 mg/dL (ref 0.40–1.20)
GFR: 68.66 mL/min (ref >60.00)
Glucose, Bld: 126 mg/dL — ABNORMAL HIGH (ref 70–99)
Potassium: 4 mEq/L (ref 3.5–5.1)
Sodium: 138 mEq/L (ref 135–145)
Total Bilirubin: 1.2 mg/dL (ref 0.2–1.2)
Total Protein: 7.2 g/dL (ref 6.0–8.3)

## 2022-02-22 LAB — TSH: TSH: 5.75 u[IU]/mL — ABNORMAL HIGH (ref 0.35–5.50)

## 2022-02-23 ENCOUNTER — Encounter: Payer: Self-pay | Admitting: Internal Medicine

## 2022-02-23 ENCOUNTER — Ambulatory Visit (INDEPENDENT_AMBULATORY_CARE_PROVIDER_SITE_OTHER): Payer: Medicare HMO | Admitting: Internal Medicine

## 2022-02-23 VITALS — BP 134/74 | HR 96 | Temp 97.8°F | Ht 65.0 in | Wt 195.6 lb

## 2022-02-23 DIAGNOSIS — I10 Essential (primary) hypertension: Secondary | ICD-10-CM | POA: Diagnosis not present

## 2022-02-23 DIAGNOSIS — R739 Hyperglycemia, unspecified: Secondary | ICD-10-CM | POA: Diagnosis not present

## 2022-02-23 DIAGNOSIS — E538 Deficiency of other specified B group vitamins: Secondary | ICD-10-CM

## 2022-02-23 DIAGNOSIS — D751 Secondary polycythemia: Secondary | ICD-10-CM | POA: Diagnosis not present

## 2022-02-23 DIAGNOSIS — E039 Hypothyroidism, unspecified: Secondary | ICD-10-CM | POA: Diagnosis not present

## 2022-02-23 NOTE — Assessment & Plan Note (Signed)
Risks associated with treatment noncompliance were discussed. Compliance was encouraged. 

## 2022-02-23 NOTE — Assessment & Plan Note (Signed)
Monitor TSH 

## 2022-02-23 NOTE — Assessment & Plan Note (Signed)
Loose wt Re-check A1c

## 2022-02-23 NOTE — Progress Notes (Signed)
Subjective:  Patient ID: Kayla Aguirre, female    DOB: 02/24/1951  Age: 71 y.o. MRN: [id number]  CC: 3 month f/u (She would like to discuss her visit with ED for her blood pressure. )   HPI Kayla Aguirre presents for HTN ER visit on 02/04/22. BP was high - she took an extra Exforge, was tired, did not sleep for 2 nights - passed out in a chair  Outpatient Medications Prior to Visit  Medication Sig Dispense Refill   amLODIPine-Valsartan-HCTZ (EXFORGE HCT) 10-160-12.5 MG TABS Take 1 tablet by mouth daily. 90 tablet 3   cephALEXin (KEFLEX) 500 MG capsule Take 1 capsule (500 mg total) by mouth 2 (two) times daily. Use prn bladder infection 12 capsule 2   Cholecalciferol (VITAMIN D3) 50 MCG (2000 UT) capsule Take 1 capsule (2,000 Units total) by mouth daily. 100 capsule 3   Cyanocobalamin (VITAMIN B-12) 1000 MCG SUBL Place 1 tablet (1,000 mcg total) under the tongue daily. 100 tablet 3   famotidine (PEPCID) 40 MG tablet Take 1 tablet (40 mg total) by mouth daily. 90 tablet 3   MegaRed Omega-3 Krill Oil 500 MG CAPS Take 1 capsule by mouth every morning. 100 capsule 3   No facility-administered medications prior to visit.    ROS: Review of Systems  Constitutional:  Negative for activity change, appetite change, chills, fatigue and unexpected weight change.  HENT:  Negative for congestion, mouth sores and sinus pressure.   Eyes:  Negative for visual disturbance.  Respiratory:  Negative for cough and chest tightness.   Gastrointestinal:  Negative for abdominal pain and nausea.  Genitourinary:  Negative for difficulty urinating, frequency and vaginal pain.  Musculoskeletal:  Positive for arthralgias. Negative for back pain and gait problem.  Skin:  Negative for pallor and rash.  Neurological:  Negative for dizziness, tremors, weakness, numbness and headaches.  Psychiatric/Behavioral:  Negative for confusion and sleep disturbance.    Objective:  BP 134/74   Pulse 96   Temp 97.8 F  (36.6 C) (Oral)   Ht '5\' 5"'$  (1.651 m)   Wt 195 lb 9.6 oz (88.7 kg)   SpO2 96%   BMI 32.55 kg/m   BP Readings from Last 3 Encounters:  02/23/22 134/74  02/04/22 125/75  11/23/21 132/82    Wt Readings from Last 3 Encounters:  02/23/22 195 lb 9.6 oz (88.7 kg)  02/04/22 185 lb 3 oz (84 kg)  11/23/21 184 lb (83.5 kg)    Physical Exam Constitutional:      General: She is not in acute distress.    Appearance: She is well-developed. She is obese.  HENT:     Head: Normocephalic.     Right Ear: External ear normal.     Left Ear: External ear normal.     Nose: Nose normal.  Eyes:     General:        Right eye: No discharge.        Left eye: No discharge.     Conjunctiva/sclera: Conjunctivae normal.     Pupils: Pupils are equal, round, and reactive to light.  Neck:     Thyroid: No thyromegaly.     Vascular: No JVD.     Trachea: No tracheal deviation.  Cardiovascular:     Rate and Rhythm: Normal rate and regular rhythm.     Heart sounds: Normal heart sounds.  Pulmonary:     Effort: No respiratory distress.     Breath sounds: No stridor. No wheezing.  Abdominal:     General: Bowel sounds are normal. There is no distension.     Palpations: Abdomen is soft. There is no mass.     Tenderness: There is no abdominal tenderness. There is no guarding or rebound.  Musculoskeletal:        General: No tenderness.     Cervical back: Normal range of motion and neck supple. No rigidity.  Lymphadenopathy:     Cervical: No cervical adenopathy.  Skin:    Findings: No erythema or rash.  Neurological:     Cranial Nerves: No cranial nerve deficit.     Motor: No abnormal muscle tone.     Coordination: Coordination normal.     Deep Tendon Reflexes: Reflexes normal.  Psychiatric:        Behavior: Behavior normal.        Thought Content: Thought content normal.        Judgment: Judgment normal.    Lab Results  Component Value Date   WBC 8.0 02/22/2022   HGB 14.6 02/22/2022   HCT 43.7  02/22/2022   PLT 217.0 02/22/2022   GLUCOSE 126 (H) 02/22/2022   CHOL 246 (H) 08/19/2021   TRIG 211.0 (H) 08/19/2021   HDL 52.90 08/19/2021   LDLDIRECT 174.0 08/19/2021   LDLCALC 172 (H) 10/24/2017   ALT 45 (H) 02/22/2022   AST 30 02/22/2022   NA 138 02/22/2022   K 4.0 02/22/2022   CL 103 02/22/2022   CREATININE 0.86 02/22/2022   BUN 18 02/22/2022   CO2 25 02/22/2022   TSH 5.75 (H) 02/22/2022   HGBA1C 6.5 02/22/2022    No results found.  Assessment & Plan:   Problem List Items Addressed This Visit     B12 deficiency    Risks associated with treatment noncompliance were discussed. Compliance was encouraged.      HTN (hypertension)   Relevant Orders   CBC with Differential/Platelet   Comprehensive metabolic panel   Hemoglobin A1c   Hyperglycemia - Primary    Loose wt Re-check A1c       Relevant Orders   Comprehensive metabolic panel   Hemoglobin A1c   Hypothyroidism    Monitor TSH       Polycythemia    Monitor CBC Baby ASA Denies OSA       Relevant Orders   CBC with Differential/Platelet      No orders of the defined types were placed in this encounter.     Follow-up: Return in about 3 months (around 05/26/2022).  Walker Kehr, MD

## 2022-02-23 NOTE — Assessment & Plan Note (Signed)
Monitor CBC Baby ASA Denies OSA

## 2022-05-25 ENCOUNTER — Other Ambulatory Visit (INDEPENDENT_AMBULATORY_CARE_PROVIDER_SITE_OTHER): Payer: Medicare HMO

## 2022-05-25 DIAGNOSIS — D751 Secondary polycythemia: Secondary | ICD-10-CM | POA: Diagnosis not present

## 2022-05-25 DIAGNOSIS — I10 Essential (primary) hypertension: Secondary | ICD-10-CM

## 2022-05-25 DIAGNOSIS — R739 Hyperglycemia, unspecified: Secondary | ICD-10-CM | POA: Diagnosis not present

## 2022-05-25 LAB — COMPREHENSIVE METABOLIC PANEL
ALT: 57 U/L — ABNORMAL HIGH (ref 0–35)
AST: 43 U/L — ABNORMAL HIGH (ref 0–37)
Albumin: 4 g/dL (ref 3.5–5.2)
Alkaline Phosphatase: 81 U/L (ref 39–117)
BUN: 17 mg/dL (ref 6–23)
CO2: 26 mEq/L (ref 19–32)
Calcium: 9.6 mg/dL (ref 8.4–10.5)
Chloride: 104 mEq/L (ref 96–112)
Creatinine, Ser: 1.04 mg/dL (ref 0.40–1.20)
GFR: 54.56 mL/min — ABNORMAL LOW (ref >60.00)
Glucose, Bld: 120 mg/dL — ABNORMAL HIGH (ref 70–99)
Potassium: 4.2 mEq/L (ref 3.5–5.1)
Sodium: 139 mEq/L (ref 135–145)
Total Bilirubin: 1.3 mg/dL — ABNORMAL HIGH (ref 0.2–1.2)
Total Protein: 7.4 g/dL (ref 6.0–8.3)

## 2022-05-25 LAB — CBC WITH DIFFERENTIAL/PLATELET
Basophils Absolute: 0.1 10*3/uL (ref 0.0–0.1)
Basophils Relative: 0.7 % (ref 0.0–3.0)
Eosinophils Absolute: 0.2 10*3/uL (ref 0.0–0.7)
Eosinophils Relative: 2.5 % (ref 0.0–5.0)
HCT: 44.8 % (ref 36.0–46.0)
Hemoglobin: 15.3 g/dL — ABNORMAL HIGH (ref 12.0–15.0)
Lymphocytes Relative: 31.1 % (ref 12.0–46.0)
Lymphs Abs: 2.2 10*3/uL (ref 0.7–4.0)
MCHC: 34.1 g/dL (ref 30.0–36.0)
MCV: 84.6 fl (ref 78.0–100.0)
Monocytes Absolute: 0.6 10*3/uL (ref 0.1–1.0)
Monocytes Relative: 7.7 % (ref 3.0–12.0)
Neutro Abs: 4.1 10*3/uL (ref 1.4–7.7)
Neutrophils Relative %: 58 % (ref 43.0–77.0)
Platelets: 223 10*3/uL (ref 150.0–400.0)
RBC: 5.3 Mil/uL — ABNORMAL HIGH (ref 3.87–5.11)
RDW: 13.4 % (ref 11.5–15.5)
WBC: 7.1 10*3/uL (ref 4.0–10.5)

## 2022-05-25 LAB — HEMOGLOBIN A1C: Hgb A1c MFr Bld: 6.5 % (ref 4.6–6.5)

## 2022-05-26 ENCOUNTER — Ambulatory Visit: Payer: Medicare HMO | Admitting: Internal Medicine

## 2022-06-09 ENCOUNTER — Ambulatory Visit (INDEPENDENT_AMBULATORY_CARE_PROVIDER_SITE_OTHER): Payer: Medicare HMO | Admitting: Internal Medicine

## 2022-06-09 ENCOUNTER — Encounter: Payer: Self-pay | Admitting: Internal Medicine

## 2022-06-09 DIAGNOSIS — I10 Essential (primary) hypertension: Secondary | ICD-10-CM

## 2022-06-09 DIAGNOSIS — R739 Hyperglycemia, unspecified: Secondary | ICD-10-CM | POA: Diagnosis not present

## 2022-06-09 DIAGNOSIS — E559 Vitamin D deficiency, unspecified: Secondary | ICD-10-CM | POA: Diagnosis not present

## 2022-06-09 DIAGNOSIS — N3090 Cystitis, unspecified without hematuria: Secondary | ICD-10-CM | POA: Diagnosis not present

## 2022-06-09 DIAGNOSIS — E538 Deficiency of other specified B group vitamins: Secondary | ICD-10-CM

## 2022-06-09 DIAGNOSIS — E039 Hypothyroidism, unspecified: Secondary | ICD-10-CM | POA: Diagnosis not present

## 2022-06-09 DIAGNOSIS — D751 Secondary polycythemia: Secondary | ICD-10-CM

## 2022-06-09 MED ORDER — CEPHALEXIN 500 MG PO CAPS: 500.0000 mg | ORAL_CAPSULE | Freq: Two times a day (BID) | ORAL | 2 refills | Status: DC

## 2022-06-09 MED ORDER — MOLNUPIRAVIR EUA 200MG CAPSULE: 4.0000 | ORAL_CAPSULE | Freq: Two times a day (BID) | ORAL | 0 refills | Status: AC

## 2022-06-09 MED ORDER — AMLODIPINE-VALSARTAN-HCTZ 10-160-12.5 MG PO TABS: 1.0000 | ORAL_TABLET | Freq: Every day | ORAL | 3 refills | Status: DC

## 2022-06-09 NOTE — Patient Instructions (Signed)
For a mild COVID-19 case - take zinc 50 mg a day for 1 week, vitamin C 1000 mg daily for 1 week, vitamin D2 50,000 units weekly for 2 months (unless  taking vitamin D daily already), an antioxidant Quercetin 500 mg twice a day for 1 week (if you can get it quick enough). Take Allegra or Benadryl.  Maintain good oral hydration and take Tylenol for high fever.  Call if problems. Isolate for 5 days, then wear a mask for 5 days per CDC.  

## 2022-06-09 NOTE — Assessment & Plan Note (Signed)
We discussed compliance, wt loss On Exforge HCT

## 2022-06-09 NOTE — Progress Notes (Signed)
Subjective:  Patient ID: Kayla Aguirre, female    DOB: 1951-01-09  Age: 71 y.o. MRN: [id number]  CC: Follow-up (3 month f/u)   HPI Kayla Aguirre presents for fatty liver, HTN, dyslipidemia  Outpatient Medications Prior to Visit  Medication Sig Dispense Refill   Cholecalciferol (VITAMIN D3) 50 MCG (2000 UT) capsule Take 1 capsule (2,000 Units total) by mouth daily. 100 capsule 3   Cyanocobalamin (VITAMIN B-12) 1000 MCG SUBL Place 1 tablet (1,000 mcg total) under the tongue daily. 100 tablet 3   famotidine (PEPCID) 40 MG tablet Take 1 tablet (40 mg total) by mouth daily. 90 tablet 3   MegaRed Omega-3 Krill Oil 500 MG CAPS Take 1 capsule by mouth every morning. 100 capsule 3   amLODIPine-Valsartan-HCTZ (EXFORGE HCT) 10-160-12.5 MG TABS Take 1 tablet by mouth daily. 90 tablet 3   cephALEXin (KEFLEX) 500 MG capsule Take 1 capsule (500 mg total) by mouth 2 (two) times daily. Use prn bladder infection 12 capsule 2   No facility-administered medications prior to visit.    ROS: Review of Systems  Constitutional:  Negative for activity change, appetite change, chills, fatigue and unexpected weight change.  HENT:  Negative for congestion, mouth sores and sinus pressure.   Eyes:  Negative for visual disturbance.  Respiratory:  Negative for cough and chest tightness.   Gastrointestinal:  Negative for abdominal pain and nausea.  Genitourinary:  Negative for difficulty urinating, frequency and vaginal pain.  Musculoskeletal:  Positive for gait problem. Negative for back pain.  Skin:  Negative for pallor and rash.  Neurological:  Negative for dizziness, tremors, weakness, numbness and headaches.  Psychiatric/Behavioral:  Negative for confusion, sleep disturbance and suicidal ideas.     Objective:  BP 118/70 (BP Location: Left Arm)   Pulse 83   Temp 98.2 F (36.8 C) (Oral)   Ht '5\' 5"'$  (1.651 m)   Wt 197 lb 6.4 oz (89.5 kg)   SpO2 95%   BMI 32.85 kg/m   BP Readings from Last 3  Encounters:  06/09/22 118/70  02/23/22 134/74  02/04/22 125/75    Wt Readings from Last 3 Encounters:  06/09/22 197 lb 6.4 oz (89.5 kg)  02/23/22 195 lb 9.6 oz (88.7 kg)  02/04/22 185 lb 3 oz (84 kg)    Physical Exam Constitutional:      General: She is not in acute distress.    Appearance: She is well-developed.  HENT:     Head: Normocephalic.     Right Ear: External ear normal.     Left Ear: External ear normal.     Nose: Nose normal.  Eyes:     General:        Right eye: No discharge.        Left eye: No discharge.     Conjunctiva/sclera: Conjunctivae normal.     Pupils: Pupils are equal, round, and reactive to light.  Neck:     Thyroid: No thyromegaly.     Vascular: No JVD.     Trachea: No tracheal deviation.  Cardiovascular:     Rate and Rhythm: Normal rate and regular rhythm.     Heart sounds: Normal heart sounds.  Pulmonary:     Effort: No respiratory distress.     Breath sounds: No stridor. No wheezing.  Abdominal:     General: Bowel sounds are normal. There is no distension.     Palpations: Abdomen is soft. There is no mass.     Tenderness: There  is no abdominal tenderness. There is no guarding or rebound.  Musculoskeletal:        General: No tenderness.     Cervical back: Normal range of motion and neck supple. No rigidity.  Lymphadenopathy:     Cervical: No cervical adenopathy.  Skin:    Findings: No erythema or rash.  Neurological:     Cranial Nerves: No cranial nerve deficit.     Motor: No abnormal muscle tone.     Coordination: Coordination normal.     Deep Tendon Reflexes: Reflexes normal.  Psychiatric:        Behavior: Behavior normal.        Thought Content: Thought content normal.        Judgment: Judgment normal.     Lab Results  Component Value Date   WBC 7.1 05/25/2022   HGB 15.3 (H) 05/25/2022   HCT 44.8 05/25/2022   PLT 223.0 05/25/2022   GLUCOSE 120 (H) 05/25/2022   CHOL 246 (H) 08/19/2021   TRIG 211.0 (H) 08/19/2021   HDL  52.90 08/19/2021   LDLDIRECT 174.0 08/19/2021   LDLCALC 172 (H) 10/24/2017   ALT 57 (H) 05/25/2022   AST 43 (H) 05/25/2022   NA 139 05/25/2022   K 4.2 05/25/2022   CL 104 05/25/2022   CREATININE 1.04 05/25/2022   BUN 17 05/25/2022   CO2 26 05/25/2022   TSH 5.75 (H) 02/22/2022   HGBA1C 6.5 05/25/2022    No results found.  Assessment & Plan:   Problem List Items Addressed This Visit     B12 deficiency    On B12      Cystitis    Recurrent Keflex prn      HTN (hypertension)    We discussed compliance, wt loss On Exforge HCT      Relevant Medications   amLODIPine-Valsartan-HCTZ (EXFORGE HCT) 10-160-12.5 MG TABS   Hyperglycemia    Continue with diet, weight loss, exercise.  Check A1c every 3 months      Hypothyroidism    Continue to monitor TSH      Polycythemia    Denies OSA.  Sleep study sug      Vitamin D deficiency    Continue with vitamin D.  Take vitamin D consistently.         Meds ordered this encounter  Medications   molnupiravir EUA (LAGEVRIO) 200 mg CAPS capsule    Sig: Take 4 capsules (800 mg total) by mouth 2 (two) times daily for 5 days.    Dispense:  40 capsule    Refill:  0   amLODIPine-Valsartan-HCTZ (EXFORGE HCT) 10-160-12.5 MG TABS    Sig: Take 1 tablet by mouth daily.    Dispense:  90 tablet    Refill:  3    Generic   cephALEXin (KEFLEX) 500 MG capsule    Sig: Take 1 capsule (500 mg total) by mouth 2 (two) times daily. Use prn bladder infection    Dispense:  12 capsule    Refill:  2    For UTI      Follow-up: Return in about 3 months (around 09/08/2022) for a follow-up visit.  Walker Kehr, MD

## 2022-06-09 NOTE — Assessment & Plan Note (Signed)
On B12 

## 2022-06-12 NOTE — Assessment & Plan Note (Signed)
Recurrent Keflex prn

## 2022-06-12 NOTE — Assessment & Plan Note (Signed)
Continue with diet, weight loss, exercise.  Check A1c every 3 months

## 2022-06-12 NOTE — Assessment & Plan Note (Signed)
Continue with vitamin D.  Take vitamin D consistently.

## 2022-06-12 NOTE — Assessment & Plan Note (Signed)
Continue to monitor TSH

## 2022-06-12 NOTE — Assessment & Plan Note (Signed)
Denies OSA.  Sleep study sug

## 2022-06-23 ENCOUNTER — Ambulatory Visit (INDEPENDENT_AMBULATORY_CARE_PROVIDER_SITE_OTHER): Payer: Medicare HMO

## 2022-06-23 VITALS — Ht 65.0 in | Wt 198.0 lb

## 2022-06-23 DIAGNOSIS — Z0000 Encounter for general adult medical examination without abnormal findings: Secondary | ICD-10-CM

## 2022-06-23 NOTE — Patient Instructions (Signed)
Kayla Aguirre , Thank you for taking time to come for your Medicare Wellness Visit. I appreciate your ongoing commitment to your health goals. Please review the following plan we discussed and let me know if I can assist you in the future.   These are the goals we discussed:  Goals      My goal is to lose 10-20 pounds.        This is a list of the screening recommended for you and due dates:  Health Maintenance  Topic Date Due   Colon Cancer Screening  Never done   Zoster (Shingles) Vaccine (1 of 2) Never done   Pneumonia Vaccine (1 - PCV) Never done   DEXA scan (bone density measurement)  Never done   Mammogram  08/19/2017   Tetanus Vaccine  03/25/2024   Hepatitis C Screening: USPSTF Recommendation to screen - Ages 18-79 yo.  Completed   HPV Vaccine  Aged Out   Flu Shot  Discontinued   COVID-19 Vaccine  Discontinued    Advanced directives: No  Conditions/risks identified: Yes  Next appointment: Follow up in one year for your annual wellness visit.   Preventive Care 32 Years and Older, Female Preventive care refers to lifestyle choices and visits with your health care provider that can promote health and wellness. What does preventive care include? A yearly physical exam. This is also called an annual well check. Dental exams once or twice a year. Routine eye exams. Ask your health care provider how often you should have your eyes checked. Personal lifestyle choices, including: Daily care of your teeth and gums. Regular physical activity. Eating a healthy diet. Avoiding tobacco and drug use. Limiting alcohol use. Practicing safe sex. Taking low-dose aspirin every day. Taking vitamin and mineral supplements as recommended by your health care provider. What happens during an annual well check? The services and screenings done by your health care provider during your annual well check will depend on your age, overall health, lifestyle risk factors, and family history of  disease. Counseling  Your health care provider may ask you questions about your: Alcohol use. Tobacco use. Drug use. Emotional well-being. Home and relationship well-being. Sexual activity. Eating habits. History of falls. Memory and ability to understand (cognition). Work and work Statistician. Reproductive health. Screening  You may have the following tests or measurements: Height, weight, and BMI. Blood pressure. Lipid and cholesterol levels. These may be checked every 5 years, or more frequently if you are over 21 years old. Skin check. Lung cancer screening. You may have this screening every year starting at age 24 if you have a 30-pack-year history of smoking and currently smoke or have quit within the past 15 years. Fecal occult blood test (FOBT) of the stool. You may have this test every year starting at age 60. Flexible sigmoidoscopy or colonoscopy. You may have a sigmoidoscopy every 5 years or a colonoscopy every 10 years starting at age 41. Hepatitis C blood test. Hepatitis B blood test. Sexually transmitted disease (STD) testing. Diabetes screening. This is done by checking your blood sugar (glucose) after you have not eaten for a while (fasting). You may have this done every 1-3 years. Bone density scan. This is done to screen for osteoporosis. You may have this done starting at age 25. Mammogram. This may be done every 1-2 years. Talk to your health care provider about how often you should have regular mammograms. Talk with your health care provider about your test results, treatment options, and  if necessary, the need for more tests. Vaccines  Your health care provider may recommend certain vaccines, such as: Influenza vaccine. This is recommended every year. Tetanus, diphtheria, and acellular pertussis (Tdap, Td) vaccine. You may need a Td booster every 10 years. Zoster vaccine. You may need this after age 30. Pneumococcal 13-valent conjugate (PCV13) vaccine. One  dose is recommended after age 36. Pneumococcal polysaccharide (PPSV23) vaccine. One dose is recommended after age 79. Talk to your health care provider about which screenings and vaccines you need and how often you need them. This information is not intended to replace advice given to you by your health care provider. Make sure you discuss any questions you have with your health care provider. Document Released: 10/02/2015 Document Revised: 05/25/2016 Document Reviewed: 07/07/2015 Elsevier Interactive Patient Education  2017 [place] Prevention in the Home Falls can cause injuries. They can happen to people of all ages. There are many things you can do to make your home safe and to help prevent falls. What can I do on the outside of my home? Regularly fix the edges of walkways and driveways and fix any cracks. Remove anything that might make you trip as you walk through a door, such as a raised step or threshold. Trim any bushes or trees on the path to your home. Use bright outdoor lighting. Clear any walking paths of anything that might make someone trip, such as rocks or tools. Regularly check to see if handrails are loose or broken. Make sure that both sides of any steps have handrails. Any raised decks and porches should have guardrails on the edges. Have any leaves, snow, or ice cleared regularly. Use sand or salt on walking paths during winter. Clean up any spills in your garage right away. This includes oil or grease spills. What can I do in the bathroom? Use night lights. Install grab bars by the toilet and in the tub and shower. Do not use towel bars as grab bars. Use non-skid mats or decals in the tub or shower. If you need to sit down in the shower, use a plastic, non-slip stool. Keep the floor dry. Clean up any water that spills on the floor as soon as it happens. Remove soap buildup in the tub or shower regularly. Attach bath mats securely with double-sided  non-slip rug tape. Do not have throw rugs and other things on the floor that can make you trip. What can I do in the bedroom? Use night lights. Make sure that you have a light by your bed that is easy to reach. Do not use any sheets or blankets that are too big for your bed. They should not hang down onto the floor. Have a firm chair that has side arms. You can use this for support while you get dressed. Do not have throw rugs and other things on the floor that can make you trip. What can I do in the kitchen? Clean up any spills right away. Avoid walking on wet floors. Keep items that you use a lot in easy-to-reach places. If you need to reach something above you, use a strong step stool that has a grab bar. Keep electrical cords out of the way. Do not use floor polish or wax that makes floors slippery. If you must use wax, use non-skid floor wax. Do not have throw rugs and other things on the floor that can make you trip. What can I do with my stairs? Do not leave any  items on the stairs. Make sure that there are handrails on both sides of the stairs and use them. Fix handrails that are broken or loose. Make sure that handrails are as long as the stairways. Check any carpeting to make sure that it is firmly attached to the stairs. Fix any carpet that is loose or worn. Avoid having throw rugs at the top or bottom of the stairs. If you do have throw rugs, attach them to the floor with carpet tape. Make sure that you have a light switch at the top of the stairs and the bottom of the stairs. If you do not have them, ask someone to add them for you. What else can I do to help prevent falls? Wear shoes that: Do not have high heels. Have rubber bottoms. Are comfortable and fit you well. Are closed at the toe. Do not wear sandals. If you use a stepladder: Make sure that it is fully opened. Do not climb a closed stepladder. Make sure that both sides of the stepladder are locked into place. Ask  someone to hold it for you, if possible. Clearly mark and make sure that you can see: Any grab bars or handrails. First and last steps. Where the edge of each step is. Use tools that help you move around (mobility aids) if they are needed. These include: Canes. Walkers. Scooters. Crutches. Turn on the lights when you go into a dark area. Replace any light bulbs as soon as they burn out. Set up your furniture so you have a clear path. Avoid moving your furniture around. If any of your floors are uneven, fix them. If there are any pets around you, be aware of where they are. Review your medicines with your doctor. Some medicines can make you feel dizzy. This can increase your chance of falling. Ask your doctor what other things that you can do to help prevent falls. This information is not intended to replace advice given to you by your health care provider. Make sure you discuss any questions you have with your health care provider. Document Released: 07/02/2009 Document Revised: 02/11/2016 Document Reviewed: 10/10/2014 Elsevier Interactive Patient Education  2017 [employer].

## 2022-06-23 NOTE — Progress Notes (Signed)
Subjective:   Kayla Aguirre is a 71 y.o. female who presents for an Initial Medicare Annual Wellness Visit.  Review of Systems     Cardiac Risk Factors include: advanced age (>19mn, >>59women)     Objective:    Today's Vitals   06/23/22 1543  Height: '5\' 5"'$  (1.651 m)   Body mass index is 32.85 kg/m.     02/04/2022    5:18 AM 04/05/2020    5:14 AM  Advanced Directives  Does Patient Have a Medical Advance Directive? No No  Would patient like information on creating a medical advance directive? No - Patient declined No - Patient declined    Current Medications (verified) Outpatient Encounter Medications as of 06/23/2022  Medication Sig   amLODIPine-Valsartan-HCTZ (EXFORGE HCT) 10-160-12.5 MG TABS Take 1 tablet by mouth daily.   cephALEXin (KEFLEX) 500 MG capsule Take 1 capsule (500 mg total) by mouth 2 (two) times daily. Use prn bladder infection   Cholecalciferol (VITAMIN D3) 50 MCG (2000 UT) capsule Take 1 capsule (2,000 Units total) by mouth daily.   Cyanocobalamin (VITAMIN B-12) 1000 MCG SUBL Place 1 tablet (1,000 mcg total) under the tongue daily.   famotidine (PEPCID) 40 MG tablet Take 1 tablet (40 mg total) by mouth daily.   MegaRed Omega-3 Krill Oil 500 MG CAPS Take 1 capsule by mouth every morning.   No facility-administered encounter medications on file as of 06/23/2022.    Allergies (verified) Erythromycin, Erythromycin base, and Hydrochlorothiazide   History: Past Medical History:  Diagnosis Date   Hypertension    No past surgical history on file. Family History  Problem Relation Age of Onset   Diabetes Father    Stroke Father    Myelodysplastic syndrome Mother    Cancer Mother        MM   Colon cancer Neg Hx    Colon polyps Neg Hx    Kidney disease Neg Hx    Social History   Socioeconomic History   Marital status: Married    Spouse name: Not on file   Number of children: 1   Years of education: Not on file   Highest education level: Not  on file  Occupational History   Occupation: Retired  Tobacco Use   Smoking status: Never   Smokeless tobacco: Never  Substance and Sexual Activity   Alcohol use: No   Drug use: No   Sexual activity: Yes  Other Topics Concern   Not on file  Social History Narrative   Not on file   Social Determinants of Health   Financial Resource Strain: Low Risk  (06/23/2022)   Overall Financial Resource Strain (CARDIA)    Difficulty of Paying Living Expenses: Not hard at all  Food Insecurity: No Food Insecurity (06/23/2022)   Hunger Vital Sign    Worried About Running Out of Food in the Last Year: Never true    RGrahamin the Last Year: Never true  Transportation Needs: No Transportation Needs (06/23/2022)   PRAPARE - THydrologist(Medical): No    Lack of Transportation (Non-Medical): No  Physical Activity: Not on file  Stress: No Stress Concern Present (06/23/2022)   FWales   Feeling of Stress : Not at all  Social Connections: Not on file    Tobacco Counseling Counseling given: Not Answered   Clinical Intake:  Pre-visit preparation completed: Yes  Pain : No/denies  pain     Nutritional Risks: None Diabetes: No  How often do you need to have someone help you when you read instructions, pamphlets, or other written materials from your doctor or pharmacy?: 1 - Never What is the last grade level you completed in school?: HSG; COLLEGE DEGREE  Diabetic? no  Interpreter Needed?: No  Information entered by :: Lisette Abu, LPN.   Activities of Daily Living    06/23/2022    3:45 PM  In your present state of health, do you have any difficulty performing the following activities:  Hearing? 0  Vision? 0  Difficulty concentrating or making decisions? 0  Walking or climbing stairs? 0  Dressing or bathing? 0  Doing errands, shopping? 0  Preparing Food and eating ? N   Using the Toilet? N  In the past six months, have you accidently leaked urine? N  Do you have problems with loss of bowel control? N  Managing your Medications? N  Managing your Finances? N  Housekeeping or managing your Housekeeping? N    Patient Care Team: Cassandria Anger, MD as PCP - Hillery Aldo, MD as Consulting Physician (Nephrology) Olga Millers, MD as Consulting Physician (Obstetrics and Gynecology)  Indicate any recent Medical Services you may have received from other than [facility] providers in the past year (date may be approximate).     Assessment:   This is a routine wellness examination for [name].  Hearing/Vision screen Hearing Screening - Comments:: Denies hearing difficulties    Dietary issues and exercise activities discussed:     Goals Addressed   None   Depression Screen    06/23/2022    3:44 PM 02/23/2022    3:04 PM 11/23/2021    2:58 PM 10/14/2020    2:38 PM 07/01/2019    2:24 PM 10/24/2017    2:36 PM  PHQ 2/9 Scores  PHQ - 2 Score 0 0 0 0 0 0  PHQ- 9 Score  2        Fall Risk    02/23/2022    3:05 PM 11/23/2021    2:58 PM 10/14/2020    2:39 PM 07/01/2019    2:24 PM 11/28/2018    1:34 PM  [place] in the past year? 0 0 0 0 0  Number falls in past yr: 0 0 0    Injury with Fall? 0 0 0    Risk for fall due to :  No Fall Risks     Follow up  Falls evaluation completed  Falls evaluation completed Falls evaluation completed    [place]:  Any stairs in or around the home? Yes  If so, are there any without handrails? No  Home free of loose throw rugs in walkways, pet beds, electrical cords, etc? Yes  Adequate lighting in your home to reduce risk of falls? Yes   ASSISTIVE DEVICES UTILIZED TO PREVENT FALLS:  Life alert? No  Use of a cane, walker or w/c? No  Grab bars in the bathroom? No  Shower chair or bench in shower? No  Elevated toilet seat or a handicapped toilet? No   TIMED UP AND  GO:  Was the test performed? Yes .  Length of time to ambulate 10 feet: 6 sec.   Gait steady and fast without use of assistive device  Cognitive Function:        06/23/2022    3:45 PM  6CIT Screen  What Year? 0 points  What month? 0 points  What time? 0 points  Count back from 20 0 points  Months in reverse 0 points  Repeat phrase 0 points  Total Score 0 points    Immunizations Immunization History  Administered Date(s) Administered   Influenza, High Dose Seasonal PF 10/24/2017   Influenza,inj,Quad PF,6+ Mos 09/15/2014, 07/16/2015   Influenza,inj,quad, With Preservative 10/20/2017   Tdap 03/25/2014    TDAP status: Up to date  Flu Vaccine status: Declined, Education has been provided regarding the importance of this vaccine but patient still declined. Advised may receive this vaccine at local pharmacy or Health Dept. Aware to provide a copy of the vaccination record if obtained from local pharmacy or Health Dept. Verbalized acceptance and understanding.  Pneumococcal vaccine status: Declined,  Education has been provided regarding the importance of this vaccine but patient still declined. Advised may receive this vaccine at local pharmacy or Health Dept. Aware to provide a copy of the vaccination record if obtained from local pharmacy or Health Dept. Verbalized acceptance and understanding.   Covid-19 vaccine status: Declined, Education has been provided regarding the importance of this vaccine but patient still declined. Advised may receive this vaccine at local pharmacy or Health Dept.or vaccine clinic. Aware to provide a copy of the vaccination record if obtained from local pharmacy or Health Dept. Verbalized acceptance and understanding.  Qualifies for Shingles Vaccine? Yes   Zostavax completed No   Shingrix Completed?: No.    Education has been provided regarding the importance of this vaccine. Patient has been advised to call insurance company to determine out of pocket  expense if they have not yet received this vaccine. Advised may also receive vaccine at local pharmacy or Health Dept. Verbalized acceptance and understanding.  Screening Tests Health Maintenance  Topic Date Due   COLONOSCOPY (Pts 45-67yr Insurance coverage will need to be confirmed)  Never done   Zoster Vaccines- Shingrix (1 of 2) Never done   Pneumonia Vaccine 70 Years old (1 - PCV) Never done   DEXA SCAN  Never done   MAMMOGRAM  08/19/2017   TETANUS/TDAP  03/25/2024   Hepatitis C Screening  Completed   HPV VACCINES  Aged Out   INFLUENZA VACCINE  Discontinued   COVID-19 Vaccine  Discontinued    Health Maintenance  Health Maintenance Due  Topic Date Due   COLONOSCOPY (Pts 45-438yrInsurance coverage will need to be confirmed)  Never done   Zoster Vaccines- Shingrix (1 of 2) Never done   Pneumonia Vaccine 6551Years old (1 - PCV) Never done   DEXA SCAN  Never done   MAMMOGRAM  08/19/2017    Colorectal cancer screening: Patient declined.  Mammogram status: Completed 12/16/2020. Repeat every year  Bone Density status: Never done  Lung Cancer Screening: (Low Dose CT Chest recommended if Age 70-80ears, 30 pack-year currently smoking OR have quit w/in 15years.) does not qualify.   Lung Cancer Screening Referral: no  Additional Screening:  Hepatitis C Screening: does qualify; Completed 03/17/2016  Vision Screening: Recommended annual ophthalmology exams for early detection of glaucoma and other disorders of the eye. Is the patient up to date with their annual eye exam?  No  Who is the provider or what is the name of the office in which the patient attends annual eye exams? Patient has to check insurance If pt is not established with a provider, would they like to be referred to a provider to establish care? No .  Dental Screening: Recommended annual dental exams for proper oral hygiene  Community Resource Referral / Chronic Care Management: CRR required this visit?  No    CCM required this visit?  No      Plan:     I have personally reviewed and noted the following in the patient's chart:   Medical and social history Use of alcohol, tobacco or illicit drugs  Current medications and supplements including opioid prescriptions. Patient is not currently taking opioid prescriptions. Functional ability and status Nutritional status Physical activity Advanced directives List of other physicians Hospitalizations, surgeries, and ER visits in previous 12 months Vitals Screenings to include cognitive, depression, and falls Referrals and appointments  In addition, I have reviewed and discussed with patient certain preventive protocols, quality metrics, and best practice recommendations. A written personalized care plan for preventive services as well as general preventive health recommendations were provided to patient.     Sheral Flow, LPN   75/09/256   Nurse Notes:  Patient declined vital signs.

## 2022-09-08 ENCOUNTER — Ambulatory Visit (INDEPENDENT_AMBULATORY_CARE_PROVIDER_SITE_OTHER): Payer: Medicare HMO | Admitting: Internal Medicine

## 2022-09-08 ENCOUNTER — Encounter: Payer: Self-pay | Admitting: Internal Medicine

## 2022-09-08 VITALS — BP 122/80 | HR 75 | Temp 98.4°F | Ht 65.0 in | Wt 196.0 lb

## 2022-09-08 DIAGNOSIS — I10 Essential (primary) hypertension: Secondary | ICD-10-CM

## 2022-09-08 DIAGNOSIS — E039 Hypothyroidism, unspecified: Secondary | ICD-10-CM

## 2022-09-08 DIAGNOSIS — E785 Hyperlipidemia, unspecified: Secondary | ICD-10-CM | POA: Diagnosis not present

## 2022-09-08 DIAGNOSIS — E538 Deficiency of other specified B group vitamins: Secondary | ICD-10-CM | POA: Diagnosis not present

## 2022-09-08 DIAGNOSIS — R739 Hyperglycemia, unspecified: Secondary | ICD-10-CM

## 2022-09-08 DIAGNOSIS — E559 Vitamin D deficiency, unspecified: Secondary | ICD-10-CM

## 2022-09-08 DIAGNOSIS — M25561 Pain in right knee: Secondary | ICD-10-CM

## 2022-09-08 MED ORDER — TRIAMCINOLONE ACETONIDE 0.1 % EX CREA: 1.0000 | TOPICAL_CREAM | Freq: Two times a day (BID) | CUTANEOUS | 3 refills | Status: AC | PRN

## 2022-09-08 MED ORDER — METHYLPREDNISOLONE ACETATE 80 MG/ML IJ SUSP
80.0000 mg | Freq: Once | INTRAMUSCULAR | Status: AC
  Administered 2022-09-08: 40 mg via INTRAMUSCULAR

## 2022-09-08 MED ORDER — METHYLPREDNISOLONE ACETATE 40 MG/ML IJ SUSP: 40.0000 mg | Freq: Once | INTRAMUSCULAR | Status: DC

## 2022-09-08 NOTE — Progress Notes (Addendum)
Office was out of 40 mg vial so I gave 1/2 '80mg'$ ../l,mb   Medical screening examination/treatment/procedure(s) were performed by non-physician practitioner and as supervising physician I was immediately available for consultation/collaboration.  I agree with above. Lew Dawes, MD

## 2022-09-08 NOTE — Progress Notes (Signed)
Subjective:  Patient ID: Kayla Aguirre, female    DOB: Aug 30, 1951  Age: 71 y.o. MRN: [id number]  CC: Follow-up   HPI Niharika P Scalf presents for HTN, dyslipidemia,  CRI    Outpatient Medications Prior to Visit  Medication Sig Dispense Refill   amLODIPine-Valsartan-HCTZ (EXFORGE HCT) 10-160-12.5 MG TABS Take 1 tablet by mouth daily. 90 tablet 3   cephALEXin (KEFLEX) 500 MG capsule Take 1 capsule (500 mg total) by mouth 2 (two) times daily. Use prn bladder infection 12 capsule 2   Cholecalciferol (VITAMIN D3) 50 MCG (2000 UT) capsule Take 1 capsule (2,000 Units total) by mouth daily. 100 capsule 3   Cyanocobalamin (VITAMIN B-12) 1000 MCG SUBL Place 1 tablet (1,000 mcg total) under the tongue daily. 100 tablet 3   famotidine (PEPCID) 40 MG tablet Take 1 tablet (40 mg total) by mouth daily. 90 tablet 3   MegaRed Omega-3 Krill Oil 500 MG CAPS Take 1 capsule by mouth every morning. 100 capsule 3   amLODipine-valsartan (EXFORGE) 10-160 MG tablet Take 1 tablet by mouth daily.     cefUROXime (CEFTIN) 250 MG tablet      oxyCODONE-acetaminophen (PERCOCET/ROXICET) 5-325 MG tablet Take 2 tablets by mouth every 6 (six) hours as needed.     promethazine-codeine (PHENERGAN WITH CODEINE) 6.25-10 MG/5ML syrup Take 5 mLs by mouth every 4 (four) hours as needed.     tamsulosin (FLOMAX) 0.4 MG CAPS capsule Take 0.4 mg by mouth daily.     No facility-administered medications prior to visit.    ROS: Review of Systems  Constitutional:  Negative for activity change, appetite change, chills, fatigue and unexpected weight change.  HENT:  Negative for congestion, mouth sores and sinus pressure.   Eyes:  Negative for visual disturbance.  Respiratory:  Negative for cough and chest tightness.   Gastrointestinal:  Negative for abdominal pain and nausea.  Genitourinary:  Negative for difficulty urinating, frequency and vaginal pain.  Musculoskeletal:  Negative for back pain and gait problem.  Skin:   Negative for pallor and rash.  Neurological:  Negative for dizziness, tremors, weakness, numbness and headaches.  Psychiatric/Behavioral:  Negative for confusion and sleep disturbance.     Objective:  BP 122/80 (BP Location: Right Arm, Patient Position: Sitting, Cuff Size: Normal)   Pulse 75   Temp 98.4 F (36.9 C) (Oral)   Ht _0  (1.651 m)   Wt 196 lb (88.9 kg)   SpO2 98%   BMI 32.62 kg/m   BP Readings from Last 3 Encounters:  09/08/22 122/80  06/09/22 118/70  02/23/22 134/74    Wt Readings from Last 3 Encounters:  09/08/22 196 lb (88.9 kg)  06/23/22 198 lb (89.8 kg)  06/09/22 197 lb 6.4 oz (89.5 kg)    Physical Exam Constitutional:      General: She is not in acute distress.    Appearance: She is well-developed. She is obese.  HENT:     Head: Normocephalic.     Right Ear: External ear normal.     Left Ear: External ear normal.     Nose: Nose normal.  Eyes:     General:        Right eye: No discharge.        Left eye: No discharge.     Conjunctiva/sclera: Conjunctivae normal.     Pupils: Pupils are equal, round, and reactive to light.  Neck:     Thyroid: No thyromegaly.     Vascular: No JVD.  Trachea: No tracheal deviation.  Cardiovascular:     Rate and Rhythm: Normal rate and regular rhythm.     Heart sounds: Normal heart sounds.  Pulmonary:     Effort: No respiratory distress.     Breath sounds: No stridor. No wheezing.  Abdominal:     General: Bowel sounds are normal. There is no distension.     Palpations: Abdomen is soft. There is no mass.     Tenderness: There is no abdominal tenderness. There is no guarding or rebound.  Musculoskeletal:        General: No tenderness.     Cervical back: Normal range of motion and neck supple. No rigidity.  Lymphadenopathy:     Cervical: No cervical adenopathy.  Skin:    Findings: No erythema or rash.  Neurological:     Cranial Nerves: No cranial nerve deficit.     Motor: No abnormal muscle tone.      Coordination: Coordination normal.     Deep Tendon Reflexes: Reflexes normal.  Psychiatric:        Behavior: Behavior normal.        Thought Content: Thought content normal.        Judgment: Judgment normal.     Lab Results  Component Value Date   WBC 8.5 09/08/2022   HGB 15.9 (H) 09/08/2022   HCT 47.3 (H) 09/08/2022   PLT 259.0 09/08/2022   GLUCOSE 87 09/08/2022   CHOL 246 (H) 08/19/2021   TRIG 211.0 (H) 08/19/2021   HDL 52.90 08/19/2021   LDLDIRECT 174.0 08/19/2021   LDLCALC 172 (H) 10/24/2017   ALT 56 (H) 09/08/2022   AST 40 (H) 09/08/2022   NA 136 09/08/2022   K 3.9 09/08/2022   CL 100 09/08/2022   CREATININE 0.92 09/08/2022   BUN 14 09/08/2022   CO2 28 09/08/2022   TSH 5.75 (H) 02/22/2022   HGBA1C 6.4 09/08/2022    No results found.  Assessment & Plan:   Problem List Items Addressed This Visit     Vitamin D deficiency   Knee pain, right - Primary   Hypothyroidism    Monitor TSH      Relevant Orders   Comprehensive metabolic panel (Completed)   CBC with Differential/Platelet (Completed)   Hemoglobin A1c (Completed)   Hyperglycemia   Relevant Orders   Comprehensive metabolic panel (Completed)   CBC with Differential/Platelet (Completed)   Hemoglobin A1c (Completed)   HTN (hypertension)    Controlled.  Continue with Exforge HCT.  Periodic c-Met check      Relevant Orders   Comprehensive metabolic panel (Completed)   CBC with Differential/Platelet (Completed)   Hemoglobin A1c (Completed)   Dyslipidemia   Relevant Orders   Comprehensive metabolic panel (Completed)   CBC with Differential/Platelet (Completed)   Hemoglobin A1c (Completed)   B12 deficiency    Continue with vitamin B12         Meds ordered this encounter  Medications   DISCONTD: methylPREDNISolone acetate (DEPO-MEDROL) injection 40 mg   methylPREDNISolone acetate (DEPO-MEDROL) injection 80 mg   triamcinolone cream (KENALOG) 0.1 %    Sig: Apply 1 Application topically 2 (two)  times daily as needed.    Dispense:  80 g    Refill:  3      Follow-up: Return in about 3 months (around 12/08/2022) for a follow-up visit.  Walker Kehr, MD

## 2022-09-08 NOTE — Patient Instructions (Signed)
Try GLYTONE exfoliating cream or  lotion (free acid value 17.5)

## 2022-09-09 LAB — CBC WITH DIFFERENTIAL/PLATELET
Basophils Absolute: 0.1 10*3/uL (ref 0.0–0.1)
Basophils Relative: 1 % (ref 0.0–3.0)
Eosinophils Absolute: 0.3 10*3/uL (ref 0.0–0.7)
Eosinophils Relative: 3 % (ref 0.0–5.0)
HCT: 47.3 % — ABNORMAL HIGH (ref 36.0–46.0)
Hemoglobin: 15.9 g/dL — ABNORMAL HIGH (ref 12.0–15.0)
Lymphocytes Relative: 30.7 % (ref 12.0–46.0)
Lymphs Abs: 2.6 10*3/uL (ref 0.7–4.0)
MCHC: 33.5 g/dL (ref 30.0–36.0)
MCV: 85.1 fl (ref 78.0–100.0)
Monocytes Absolute: 0.7 10*3/uL (ref 0.1–1.0)
Monocytes Relative: 8.4 % (ref 3.0–12.0)
Neutro Abs: 4.8 10*3/uL (ref 1.4–7.7)
Neutrophils Relative %: 56.9 % (ref 43.0–77.0)
Platelets: 259 10*3/uL (ref 150.0–400.0)
RBC: 5.55 Mil/uL — ABNORMAL HIGH (ref 3.87–5.11)
RDW: 13.5 % (ref 11.5–15.5)
WBC: 8.5 10*3/uL (ref 4.0–10.5)

## 2022-09-09 LAB — COMPREHENSIVE METABOLIC PANEL
ALT: 56 U/L — ABNORMAL HIGH (ref 0–35)
AST: 40 U/L — ABNORMAL HIGH (ref 0–37)
Albumin: 4.3 g/dL (ref 3.5–5.2)
Alkaline Phosphatase: 85 U/L (ref 39–117)
BUN: 14 mg/dL (ref 6–23)
CO2: 28 mEq/L (ref 19–32)
Calcium: 9.7 mg/dL (ref 8.4–10.5)
Chloride: 100 mEq/L (ref 96–112)
Creatinine, Ser: 0.92 mg/dL (ref 0.40–1.20)
GFR: 63.08 mL/min (ref >60.00)
Glucose, Bld: 87 mg/dL (ref 70–99)
Potassium: 3.9 mEq/L (ref 3.5–5.1)
Sodium: 136 mEq/L (ref 135–145)
Total Bilirubin: 1 mg/dL (ref 0.2–1.2)
Total Protein: 7.4 g/dL (ref 6.0–8.3)

## 2022-09-09 LAB — HEMOGLOBIN A1C: Hgb A1c MFr Bld: 6.4 % (ref 4.6–6.5)

## 2022-09-13 NOTE — Assessment & Plan Note (Signed)
Continue with vitamin B12 

## 2022-09-13 NOTE — Assessment & Plan Note (Signed)
Controlled.  Continue with Exforge HCT.  Periodic c-Met check

## 2022-09-13 NOTE — Assessment & Plan Note (Signed)
Monitor TSH 

## 2022-09-15 NOTE — Assessment & Plan Note (Addendum)
Options discussed See procedure

## 2022-11-27 IMAGING — MG MM DIGITAL DIAGNOSTIC UNILAT*L* W/ TOMO W/ CAD
8 series · 9 of 24 positions shown · non-contrast
Comparison: Previous exam(s).

CLINICAL DATA: 69-year-old female recalled from screening mammogram
dated 11/25/2020 for a possible left breast mass.

EXAM:
DIGITAL DIAGNOSTIC UNILATERAL LEFT MAMMOGRAM WITH TOMOSYNTHESIS AND
CAD
TECHNIQUE: Left digital diagnostic mammography and breast tomosynthesis was
performed. The images were evaluated with computer-aided detection.

[L ML synth-2D]
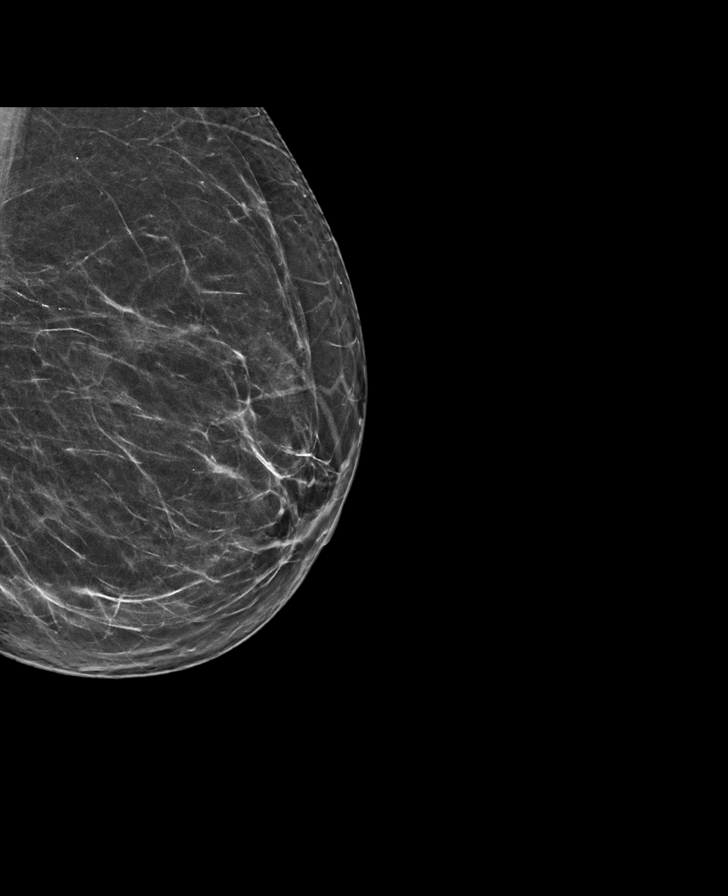

[L MLO synth-2D]
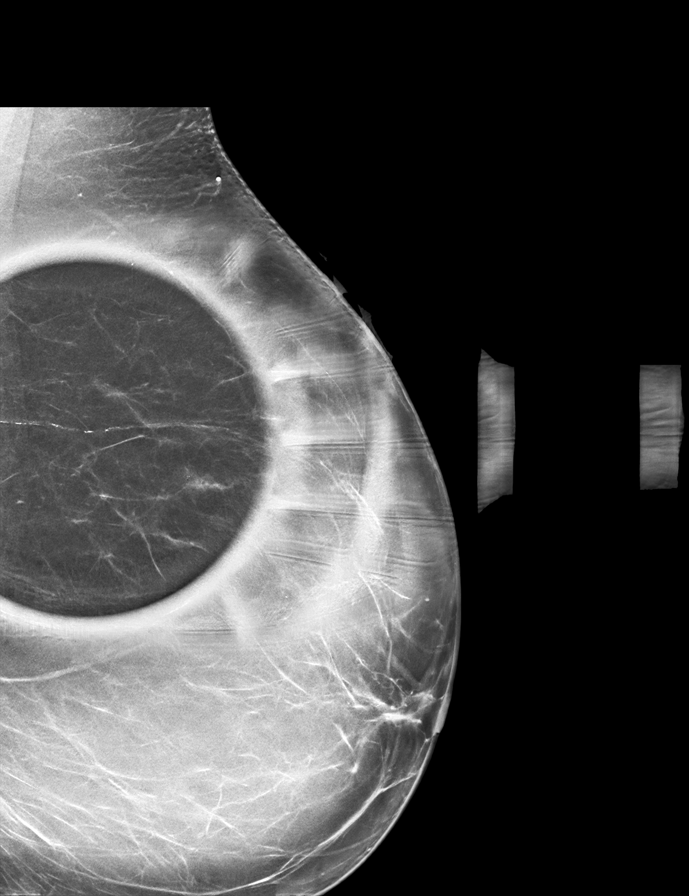

[L CC synth-2D (1 of 2)]
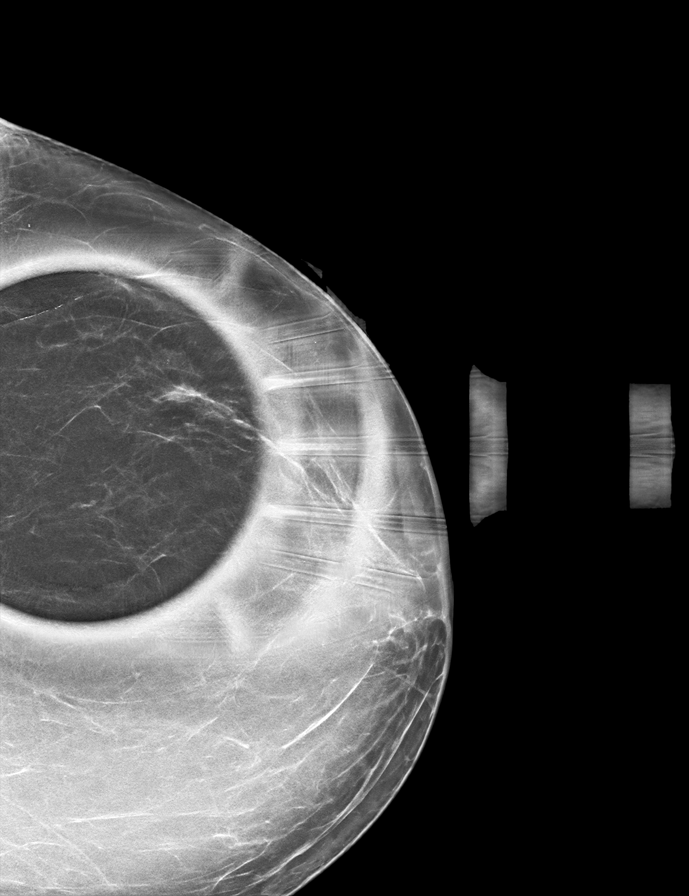

[L CC synth-2D (2 of 2)]
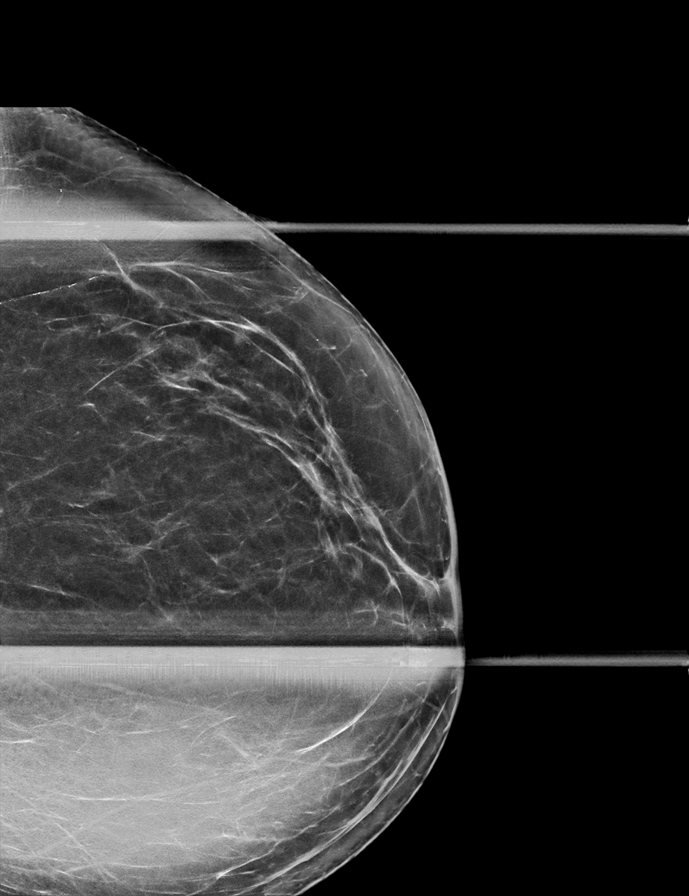

[L MLO tomo · 2 of 65 frames shown]
[frame 21/65]
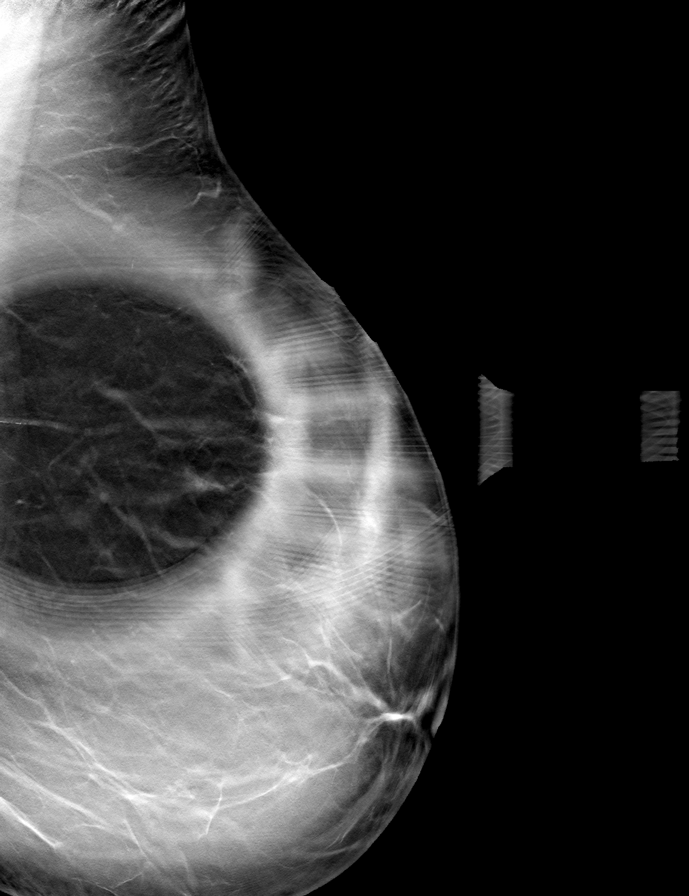
[frame 33/65]
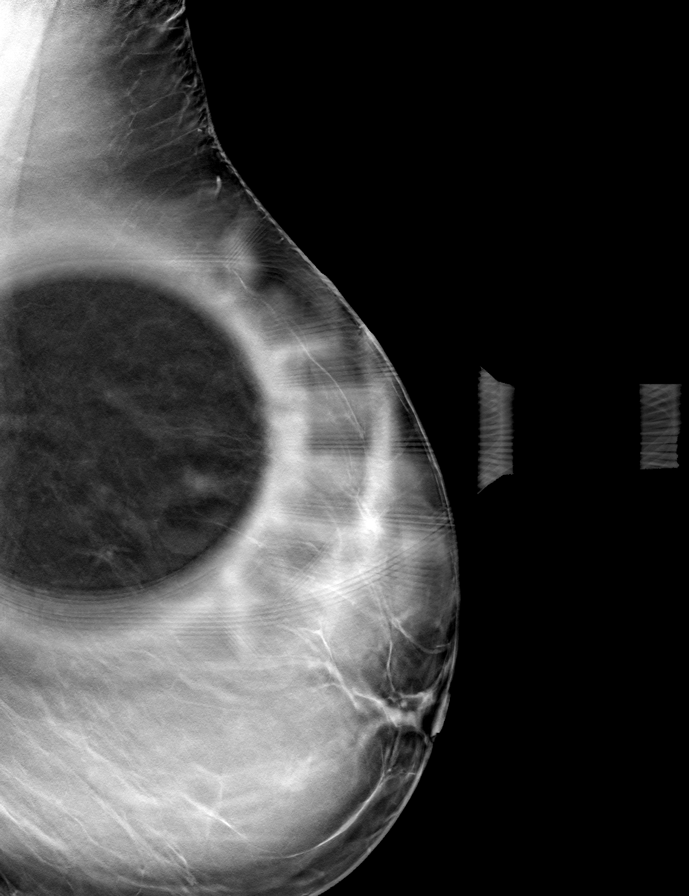

[L ML tomo · tomo slice 35/69.0]
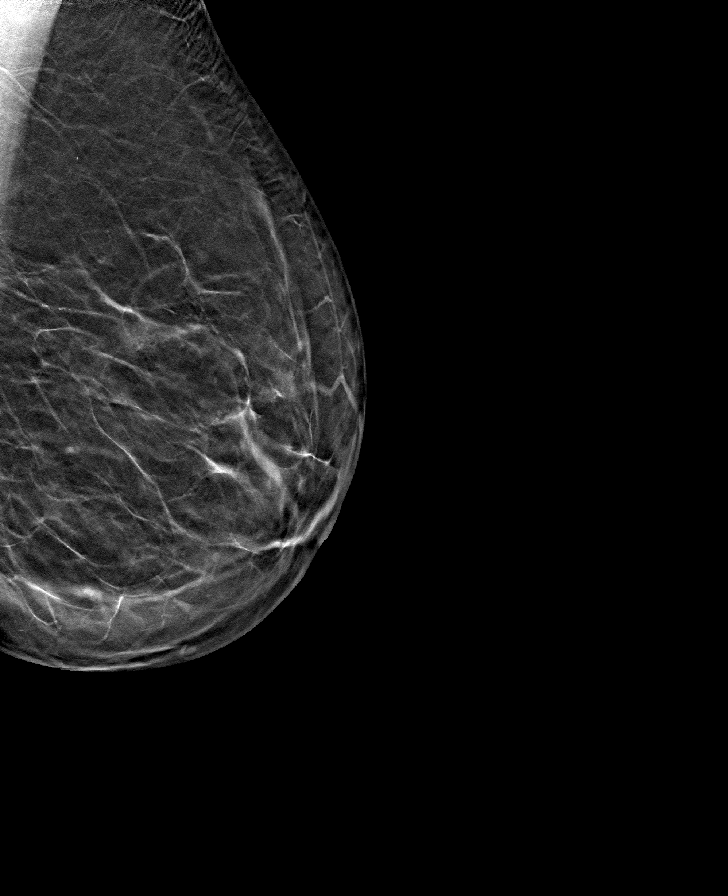

[L CC tomo (1 of 2) · tomo slice 34/67.0]
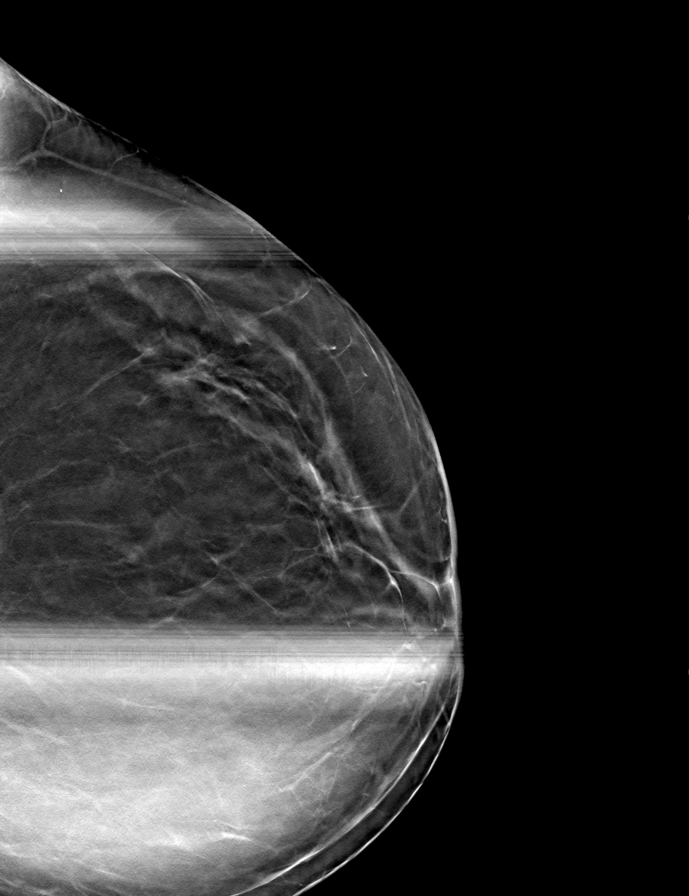

[L CC tomo (2 of 2) · tomo slice 30/59.0]
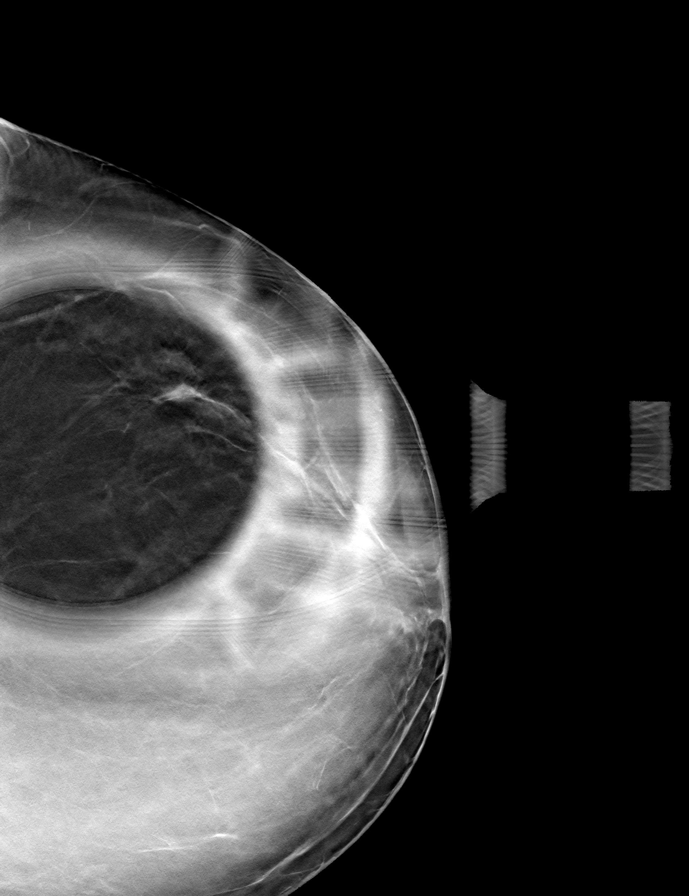

[9 of 24 positions shown; findings below may reference images not displayed]

ACR Breast Density Category b: There are scattered areas of
fibroglandular density.
FINDINGS: Previously described possible mass in the upper outer left breast at
middle depth does not persist on today's additional views. This area
resolves into well dispersed fibroglandular tissue. No suspicious
findings identified.
IMPRESSION: No mammographic evidence of malignancy.

RECOMMENDATION:
Screening mammogram in one year.(Code:BU-7-4I1)

I have discussed the findings and recommendations with the patient.
If applicable, a reminder letter will be sent to the patient
regarding the next appointment.

BI-RADS CATEGORY  1: Negative.

## 2022-12-08 ENCOUNTER — Encounter: Payer: Self-pay | Admitting: Internal Medicine

## 2022-12-08 ENCOUNTER — Ambulatory Visit (INDEPENDENT_AMBULATORY_CARE_PROVIDER_SITE_OTHER): Payer: Medicare HMO | Admitting: Internal Medicine

## 2022-12-08 VITALS — BP 118/80 | HR 85 | Temp 98.6°F | Ht 65.0 in | Wt 200.0 lb

## 2022-12-08 DIAGNOSIS — E039 Hypothyroidism, unspecified: Secondary | ICD-10-CM | POA: Diagnosis not present

## 2022-12-08 DIAGNOSIS — E538 Deficiency of other specified B group vitamins: Secondary | ICD-10-CM

## 2022-12-08 DIAGNOSIS — K760 Fatty (change of) liver, not elsewhere classified: Secondary | ICD-10-CM | POA: Diagnosis not present

## 2022-12-08 DIAGNOSIS — E559 Vitamin D deficiency, unspecified: Secondary | ICD-10-CM | POA: Diagnosis not present

## 2022-12-08 DIAGNOSIS — N3090 Cystitis, unspecified without hematuria: Secondary | ICD-10-CM | POA: Diagnosis not present

## 2022-12-08 DIAGNOSIS — R739 Hyperglycemia, unspecified: Secondary | ICD-10-CM | POA: Diagnosis not present

## 2022-12-08 DIAGNOSIS — R7989 Other specified abnormal findings of blood chemistry: Secondary | ICD-10-CM

## 2022-12-08 DIAGNOSIS — D508 Other iron deficiency anemias: Secondary | ICD-10-CM | POA: Diagnosis not present

## 2022-12-08 DIAGNOSIS — I10 Essential (primary) hypertension: Secondary | ICD-10-CM

## 2022-12-08 MED ORDER — CEPHALEXIN 500 MG PO CAPS: 500.0000 mg | ORAL_CAPSULE | Freq: Two times a day (BID) | ORAL | 2 refills | Status: DC

## 2022-12-08 NOTE — Assessment & Plan Note (Signed)
Monitor TSH 

## 2022-12-08 NOTE — Assessment & Plan Note (Signed)
We discussed compliance, wt loss On Exforge HCT 

## 2022-12-08 NOTE — Assessment & Plan Note (Signed)
Vit D3 and K2 

## 2022-12-08 NOTE — Assessment & Plan Note (Addendum)
Check CBC Iron rich foods

## 2022-12-08 NOTE — Patient Instructions (Addendum)
Vit D3 and K2  Milk thistle

## 2022-12-08 NOTE — Assessment & Plan Note (Addendum)
On B12 Compliance encouraged

## 2022-12-08 NOTE — Assessment & Plan Note (Signed)
Cont w/wt loss 

## 2022-12-08 NOTE — Assessment & Plan Note (Addendum)
Check LFTs Start Milk thistle  Continue with weight loss

## 2022-12-08 NOTE — Assessment & Plan Note (Signed)
A1c

## 2022-12-08 NOTE — Progress Notes (Signed)
Subjective:  Patient ID: Kayla Aguirre, female    DOB: [birth year]  Age: 72 y.o. MRN: [id number]  CC: Follow-up (3 mnth f/u)   HPI Kayla Aguirre presents for hyperglycemia, on B12, fatty liver  Outpatient Medications Prior to Visit  Medication Sig Dispense Refill   amLODIPine-Valsartan-HCTZ (EXFORGE HCT) 10-160-12.5 MG TABS Take 1 tablet by mouth daily. 90 tablet 3   Cholecalciferol (VITAMIN D3) 50 MCG (2000 UT) capsule Take 1 capsule (2,000 Units total) by mouth daily. 100 capsule 3   Cyanocobalamin (VITAMIN B-12) 1000 MCG SUBL Place 1 tablet (1,000 mcg total) under the tongue daily. 100 tablet 3   famotidine (PEPCID) 40 MG tablet Take 1 tablet (40 mg total) by mouth daily. 90 tablet 3   MegaRed Omega-3 Krill Oil 500 MG CAPS Take 1 capsule by mouth every morning. 100 capsule 3   triamcinolone cream (KENALOG) 0.1 % Apply 1 Application topically 2 (two) times daily as needed. 80 g 3   cephALEXin (KEFLEX) 500 MG capsule Take 1 capsule (500 mg total) by mouth 2 (two) times daily. Use prn bladder infection 12 capsule 2   No facility-administered medications prior to visit.    ROS: Review of Systems  Constitutional:  Negative for activity change, appetite change, chills, fatigue and unexpected weight change.  HENT:  Negative for congestion, mouth sores and sinus pressure.   Eyes:  Negative for visual disturbance.  Respiratory:  Negative for cough and chest tightness.   Gastrointestinal:  Negative for abdominal pain and nausea.  Genitourinary:  Negative for difficulty urinating, frequency and vaginal pain.  Musculoskeletal:  Negative for back pain and gait problem.  Skin:  Negative for pallor and rash.  Neurological:  Negative for dizziness, tremors, weakness, numbness and headaches.  Psychiatric/Behavioral:  Negative for confusion and sleep disturbance.     Objective:  BP 118/80 (BP Location: Left Arm, Patient Position: Sitting, Cuff Size: Large)   Pulse 85   Temp 98.6 F  (37 C) (Oral)   Ht 5\' 5"  (1.651 m)   Wt 200 lb (90.7 kg)   SpO2 94%   BMI 33.28 kg/m   BP Readings from Last 3 Encounters:  12/08/22 118/80  09/08/22 122/80  06/09/22 118/70    Wt Readings from Last 3 Encounters:  12/08/22 200 lb (90.7 kg)  09/08/22 196 lb (88.9 kg)  06/23/22 198 lb (89.8 kg)    Physical Exam Constitutional:      General: She is not in acute distress.    Appearance: She is well-developed. She is obese.  HENT:     Head: Normocephalic.     Right Ear: External ear normal.     Left Ear: External ear normal.     Nose: Nose normal.  Eyes:     General:        Right eye: No discharge.        Left eye: No discharge.     Conjunctiva/sclera: Conjunctivae normal.     Pupils: Pupils are equal, round, and reactive to light.  Neck:     Thyroid: No thyromegaly.     Vascular: No JVD.     Trachea: No tracheal deviation.  Cardiovascular:     Rate and Rhythm: Normal rate and regular rhythm.     Heart sounds: Normal heart sounds.  Pulmonary:     Effort: No respiratory distress.     Breath sounds: No stridor. No wheezing.  Abdominal:     General: Bowel sounds are normal. There is no  distension.     Palpations: Abdomen is soft. There is no mass.     Tenderness: There is no abdominal tenderness. There is no guarding or rebound.  Musculoskeletal:        General: No tenderness.     Cervical back: Normal range of motion and neck supple. No rigidity.  Lymphadenopathy:     Cervical: No cervical adenopathy.  Skin:    Findings: No erythema or rash.  Neurological:     Cranial Nerves: No cranial nerve deficit.     Motor: No abnormal muscle tone.     Coordination: Coordination normal.     Deep Tendon Reflexes: Reflexes normal.  Psychiatric:        Behavior: Behavior normal.        Thought Content: Thought content normal.        Judgment: Judgment normal.     Lab Results  Component Value Date   WBC 8.5 09/08/2022   HGB 15.9 (H) 09/08/2022   HCT 47.3 (H)  09/08/2022   PLT 259.0 09/08/2022   GLUCOSE 87 09/08/2022   CHOL 246 (H) 08/19/2021   TRIG 211.0 (H) 08/19/2021   HDL 52.90 08/19/2021   LDLDIRECT 174.0 08/19/2021   LDLCALC 172 (H) 10/24/2017   ALT 56 (H) 09/08/2022   AST 40 (H) 09/08/2022   NA 136 09/08/2022   K 3.9 09/08/2022   CL 100 09/08/2022   CREATININE 0.92 09/08/2022   BUN 14 09/08/2022   CO2 28 09/08/2022   TSH 5.75 (H) 02/22/2022   HGBA1C 6.4 09/08/2022    No results found.  Assessment & Plan:   Problem List Items Addressed This Visit       Cardiovascular and Mediastinum   HTN (hypertension)    We discussed compliance, wt loss On Exforge HCT      Relevant Orders   CBC with Differential/Platelet   Comprehensive metabolic panel   Iron, TIBC and Ferritin Panel   VITAMIN D 25 Hydroxy (Vit-D Deficiency, Fractures)   Vitamin B12   Hemoglobin A1c   Lipid panel     Digestive   Fatty liver    Check LFTs Start Milk thistle  Continue with weight loss      Relevant Orders   CBC with Differential/Platelet   Comprehensive metabolic panel   Iron, TIBC and Ferritin Panel   VITAMIN D 25 Hydroxy (Vit-D Deficiency, Fractures)   Vitamin B12   Hemoglobin A1c   Lipid panel     Endocrine   Hypothyroidism    Monitor TSH      Relevant Orders   CBC with Differential/Platelet   Comprehensive metabolic panel   Iron, TIBC and Ferritin Panel   VITAMIN D 25 Hydroxy (Vit-D Deficiency, Fractures)   Vitamin B12   Hemoglobin A1c   Lipid panel     Other   Vitamin D deficiency    Vit D3 and K2      Iron deficiency anemia    Check CBC Iron rich foods      Relevant Orders   CBC with Differential/Platelet   Comprehensive metabolic panel   Iron, TIBC and Ferritin Panel   VITAMIN D 25 Hydroxy (Vit-D Deficiency, Fractures)   Vitamin B12   Hemoglobin A1c   Lipid panel   Hyperglycemia    A1c      Relevant Orders   CBC with Differential/Platelet   Comprehensive metabolic panel   Iron, TIBC and Ferritin  Panel   VITAMIN D 25 Hydroxy (Vit-D Deficiency, Fractures)   Vitamin  B12   Hemoglobin A1c   Lipid panel   Elevated LFTs    Cont w/wt loss      B12 deficiency    On B12 Compliance encouraged       Other Visit Diagnoses     Bladder infection    -  Primary   Relevant Medications   cephALEXin (KEFLEX) 500 MG capsule         Meds ordered this encounter  Medications   cephALEXin (KEFLEX) 500 MG capsule    Sig: Take 1 capsule (500 mg total) by mouth 2 (two) times daily. Use prn bladder infection    Dispense:  12 capsule    Refill:  2    For UTI      Follow-up: Return in about 3 months (around 03/10/2023) for a follow-up visit.  Walker Kehr, MD

## 2023-03-16 ENCOUNTER — Ambulatory Visit: Payer: Medicare HMO | Admitting: Internal Medicine

## 2023-03-16 ENCOUNTER — Encounter: Payer: Self-pay | Admitting: Internal Medicine

## 2023-03-16 VITALS — BP 122/82 | HR 75 | Temp 98.6°F | Ht 65.0 in | Wt 192.0 lb

## 2023-03-16 DIAGNOSIS — R739 Hyperglycemia, unspecified: Secondary | ICD-10-CM

## 2023-03-16 DIAGNOSIS — E039 Hypothyroidism, unspecified: Secondary | ICD-10-CM | POA: Diagnosis not present

## 2023-03-16 DIAGNOSIS — E559 Vitamin D deficiency, unspecified: Secondary | ICD-10-CM

## 2023-03-16 DIAGNOSIS — D751 Secondary polycythemia: Secondary | ICD-10-CM | POA: Diagnosis not present

## 2023-03-16 DIAGNOSIS — E538 Deficiency of other specified B group vitamins: Secondary | ICD-10-CM | POA: Diagnosis not present

## 2023-03-16 MED ORDER — AMLODIPINE-VALSARTAN-HCTZ 10-160-12.5 MG PO TABS: 1.0000 | ORAL_TABLET | Freq: Every day | ORAL | 3 refills | Status: DC

## 2023-03-16 MED ORDER — FAMOTIDINE 40 MG PO TABS: 40.0000 mg | ORAL_TABLET | Freq: Every day | ORAL | 3 refills | Status: DC

## 2023-03-16 NOTE — Assessment & Plan Note (Signed)
Vit D3 and K2 

## 2023-03-16 NOTE — Assessment & Plan Note (Signed)
A1c

## 2023-03-16 NOTE — Assessment & Plan Note (Signed)
On B12 

## 2023-03-16 NOTE — Assessment & Plan Note (Signed)
Chronic - not taking Rx Monitor TSH

## 2023-03-16 NOTE — Assessment & Plan Note (Signed)
Check CBC 

## 2023-03-16 NOTE — Progress Notes (Signed)
Subjective:  Patient ID: Kayla Aguirre, female    DOB: 1951/09/17  Age: 72 y.o. MRN: [id number]  CC: Follow-up (3 mnth f/u)   HPI Kayla Aguirre presents for HTN, DM, dyslipidemia   Outpatient Medications Prior to Visit  Medication Sig Dispense Refill   Cholecalciferol (VITAMIN D3) 50 MCG (2000 UT) capsule Take 1 capsule (2,000 Units total) by mouth daily. 100 capsule 3   Cyanocobalamin (VITAMIN B-12) 1000 MCG SUBL Place 1 tablet (1,000 mcg total) under the tongue daily. 100 tablet 3   MegaRed Omega-3 Krill Oil 500 MG CAPS Take 1 capsule by mouth every morning. 100 capsule 3   triamcinolone cream (KENALOG) 0.1 % Apply 1 Application topically 2 (two) times daily as needed. 80 g 3   amLODIPine-Valsartan-HCTZ (EXFORGE HCT) 10-160-12.5 MG TABS Take 1 tablet by mouth daily. 90 tablet 3   famotidine (PEPCID) 40 MG tablet Take 1 tablet (40 mg total) by mouth daily. 90 tablet 3   cephALEXin (KEFLEX) 500 MG capsule Take 1 capsule (500 mg total) by mouth 2 (two) times daily. Use prn bladder infection (Patient not taking: Reported on 03/16/2023) 12 capsule 2   No facility-administered medications prior to visit.    ROS: Review of Systems  Constitutional:  Negative for activity change, appetite change, chills, fatigue and unexpected weight change.  HENT:  Negative for congestion, mouth sores and sinus pressure.   Eyes:  Negative for visual disturbance.  Respiratory:  Negative for cough and chest tightness.   Gastrointestinal:  Negative for abdominal pain and nausea.  Genitourinary:  Negative for difficulty urinating, frequency and vaginal pain.  Musculoskeletal:  Negative for back pain and gait problem.  Skin:  Negative for pallor and rash.  Neurological:  Negative for dizziness, tremors, weakness, numbness and headaches.  Psychiatric/Behavioral:  Negative for confusion and sleep disturbance.     Objective:  BP 122/82 (BP Location: Left Arm, Patient Position: Sitting, Cuff Size:  Large)   Pulse 75   Temp 98.6 F (37 C) (Oral)   Ht 5\' 5"  (1.651 m)   Wt 192 lb (87.1 kg)   SpO2 96%   BMI 31.95 kg/m   BP Readings from Last 3 Encounters:  03/16/23 122/82  12/08/22 118/80  09/08/22 122/80    Wt Readings from Last 3 Encounters:  03/16/23 192 lb (87.1 kg)  12/08/22 200 lb (90.7 kg)  09/08/22 196 lb (88.9 kg)    Physical Exam Constitutional:      General: She is not in acute distress.    Appearance: Normal appearance. She is well-developed.  HENT:     Head: Normocephalic.     Right Ear: External ear normal.     Left Ear: External ear normal.     Nose: Nose normal.  Eyes:     General:        Right eye: No discharge.        Left eye: No discharge.     Conjunctiva/sclera: Conjunctivae normal.     Pupils: Pupils are equal, round, and reactive to light.  Neck:     Thyroid: No thyromegaly.     Vascular: No JVD.     Trachea: No tracheal deviation.  Cardiovascular:     Rate and Rhythm: Normal rate and regular rhythm.     Heart sounds: Normal heart sounds.  Pulmonary:     Effort: No respiratory distress.     Breath sounds: No stridor. No wheezing.  Abdominal:     General: Bowel sounds are  normal. There is no distension.     Palpations: Abdomen is soft. There is no mass.     Tenderness: There is no abdominal tenderness. There is no guarding or rebound.  Musculoskeletal:        General: No tenderness.     Cervical back: Normal range of motion and neck supple. No rigidity.  Lymphadenopathy:     Cervical: No cervical adenopathy.  Skin:    Findings: No erythema or rash.  Neurological:     Cranial Nerves: No cranial nerve deficit.     Motor: No abnormal muscle tone.     Coordination: Coordination normal.     Deep Tendon Reflexes: Reflexes normal.  Psychiatric:        Behavior: Behavior normal.        Thought Content: Thought content normal.        Judgment: Judgment normal.     Lab Results  Component Value Date   WBC 6.3 03/17/2023   HGB 15.2  (H) 03/17/2023   HCT 45.4 03/17/2023   PLT 235.0 03/17/2023   GLUCOSE 133 (H) 03/17/2023   CHOL 247 (H) 03/17/2023   TRIG 211.0 (H) 03/17/2023   HDL 49.20 03/17/2023   LDLDIRECT 169.0 03/17/2023   LDLCALC 172 (H) 10/24/2017   ALT 54 (H) 03/17/2023   AST 40 (H) 03/17/2023   NA 137 03/17/2023   K 4.5 03/17/2023   CL 103 03/17/2023   CREATININE 1.01 03/17/2023   BUN 18 03/17/2023   CO2 28 03/17/2023   TSH 5.75 (H) 02/22/2022   HGBA1C 6.4 03/17/2023    No results found.  Assessment & Plan:   Problem List Items Addressed This Visit     Hypothyroidism - Primary    Chronic - not taking Rx Monitor TSH      B12 deficiency    On B12      Vitamin D deficiency    Vit D3 and K2      Polycythemia    Check CBC      Hyperglycemia    A1c         Meds ordered this encounter  Medications   amLODIPine-Valsartan-HCTZ (EXFORGE HCT) 10-160-12.5 MG TABS    Sig: Take 1 tablet by mouth daily.    Dispense:  90 tablet    Refill:  3    Generic   famotidine (PEPCID) 40 MG tablet    Sig: Take 1 tablet (40 mg total) by mouth daily.    Dispense:  90 tablet    Refill:  3      Follow-up: Return in about 3 months (around 06/16/2023) for a follow-up visit.  Sonda Primes, MD

## 2023-03-17 ENCOUNTER — Other Ambulatory Visit (INDEPENDENT_AMBULATORY_CARE_PROVIDER_SITE_OTHER): Payer: Medicare HMO

## 2023-03-17 DIAGNOSIS — E039 Hypothyroidism, unspecified: Secondary | ICD-10-CM | POA: Diagnosis not present

## 2023-03-17 DIAGNOSIS — D508 Other iron deficiency anemias: Secondary | ICD-10-CM | POA: Diagnosis not present

## 2023-03-17 DIAGNOSIS — R739 Hyperglycemia, unspecified: Secondary | ICD-10-CM | POA: Diagnosis not present

## 2023-03-17 DIAGNOSIS — K760 Fatty (change of) liver, not elsewhere classified: Secondary | ICD-10-CM

## 2023-03-17 DIAGNOSIS — I10 Essential (primary) hypertension: Secondary | ICD-10-CM

## 2023-03-17 LAB — COMPREHENSIVE METABOLIC PANEL
ALT: 54 U/L — ABNORMAL HIGH (ref 0–35)
AST: 40 U/L — ABNORMAL HIGH (ref 0–37)
Albumin: 3.9 g/dL (ref 3.5–5.2)
Alkaline Phosphatase: 79 U/L (ref 39–117)
BUN: 18 mg/dL (ref 6–23)
CO2: 28 mEq/L (ref 19–32)
Calcium: 9.4 mg/dL (ref 8.4–10.5)
Chloride: 103 mEq/L (ref 96–112)
Creatinine, Ser: 1.01 mg/dL (ref 0.40–1.20)
GFR: 56.19 mL/min — ABNORMAL LOW (ref >60.00)
Glucose, Bld: 133 mg/dL — ABNORMAL HIGH (ref 70–99)
Potassium: 4.5 mEq/L (ref 3.5–5.1)
Sodium: 137 mEq/L (ref 135–145)
Total Bilirubin: 1.4 mg/dL — ABNORMAL HIGH (ref 0.2–1.2)
Total Protein: 6.9 g/dL (ref 6.0–8.3)

## 2023-03-17 LAB — CBC WITH DIFFERENTIAL/PLATELET
Basophils Absolute: 0.1 10*3/uL (ref 0.0–0.1)
Basophils Relative: 0.8 % (ref 0.0–3.0)
Eosinophils Absolute: 0.1 10*3/uL (ref 0.0–0.7)
Eosinophils Relative: 2.2 % (ref 0.0–5.0)
HCT: 45.4 % (ref 36.0–46.0)
Hemoglobin: 15.2 g/dL — ABNORMAL HIGH (ref 12.0–15.0)
Lymphocytes Relative: 35 % (ref 12.0–46.0)
Lymphs Abs: 2.2 10*3/uL (ref 0.7–4.0)
MCHC: 33.4 g/dL (ref 30.0–36.0)
MCV: 85.3 fl (ref 78.0–100.0)
Monocytes Absolute: 0.5 10*3/uL (ref 0.1–1.0)
Monocytes Relative: 7.3 % (ref 3.0–12.0)
Neutro Abs: 3.4 10*3/uL (ref 1.4–7.7)
Neutrophils Relative %: 54.7 % (ref 43.0–77.0)
Platelets: 235 10*3/uL (ref 150.0–400.0)
RBC: 5.32 Mil/uL — ABNORMAL HIGH (ref 3.87–5.11)
RDW: 13.3 % (ref 11.5–15.5)
WBC: 6.3 10*3/uL (ref 4.0–10.5)

## 2023-03-17 LAB — LIPID PANEL
Cholesterol: 247 mg/dL — ABNORMAL HIGH (ref 0–200)
HDL: 49.2 mg/dL (ref >39.00)
NonHDL: 198.11
Total CHOL/HDL Ratio: 5
Triglycerides: 211 mg/dL — ABNORMAL HIGH (ref 0.0–149.0)
VLDL: 42.2 mg/dL — ABNORMAL HIGH (ref 0.0–40.0)

## 2023-03-17 LAB — LDL CHOLESTEROL, DIRECT: Direct LDL: 169 mg/dL

## 2023-03-17 LAB — HEMOGLOBIN A1C: Hgb A1c MFr Bld: 6.4 % (ref 4.6–6.5)

## 2023-03-17 LAB — VITAMIN D 25 HYDROXY (VIT D DEFICIENCY, FRACTURES): VITD: 12.91 ng/mL — ABNORMAL LOW (ref 30.00–100.00)

## 2023-03-17 LAB — VITAMIN B12: Vitamin B-12: 367 pg/mL (ref 211–911)

## 2023-03-18 LAB — IRON,TIBC AND FERRITIN PANEL
%SAT: 38 % (calc) (ref 16–45)
Ferritin: 107 ng/mL (ref 16–288)
Iron: 124 ug/dL (ref 45–160)
TIBC: 328 mcg/dL (calc) (ref 250–450)

## 2023-05-04 ENCOUNTER — Encounter (INDEPENDENT_AMBULATORY_CARE_PROVIDER_SITE_OTHER): Payer: Self-pay

## 2023-06-19 ENCOUNTER — Ambulatory Visit (INDEPENDENT_AMBULATORY_CARE_PROVIDER_SITE_OTHER): Payer: Medicare HMO | Admitting: Internal Medicine

## 2023-06-19 ENCOUNTER — Encounter: Payer: Self-pay | Admitting: Internal Medicine

## 2023-06-19 VITALS — BP 120/80 | HR 88 | Temp 98.0°F | Ht 65.0 in | Wt 192.0 lb

## 2023-06-19 DIAGNOSIS — E559 Vitamin D deficiency, unspecified: Secondary | ICD-10-CM | POA: Diagnosis not present

## 2023-06-19 DIAGNOSIS — E538 Deficiency of other specified B group vitamins: Secondary | ICD-10-CM | POA: Diagnosis not present

## 2023-06-19 DIAGNOSIS — I10 Essential (primary) hypertension: Secondary | ICD-10-CM

## 2023-06-19 DIAGNOSIS — N3090 Cystitis, unspecified without hematuria: Secondary | ICD-10-CM | POA: Diagnosis not present

## 2023-06-19 DIAGNOSIS — G4709 Other insomnia: Secondary | ICD-10-CM | POA: Diagnosis not present

## 2023-06-19 DIAGNOSIS — R739 Hyperglycemia, unspecified: Secondary | ICD-10-CM | POA: Diagnosis not present

## 2023-06-19 DIAGNOSIS — K760 Fatty (change of) liver, not elsewhere classified: Secondary | ICD-10-CM

## 2023-06-19 DIAGNOSIS — E785 Hyperlipidemia, unspecified: Secondary | ICD-10-CM

## 2023-06-19 DIAGNOSIS — E039 Hypothyroidism, unspecified: Secondary | ICD-10-CM

## 2023-06-19 MED ORDER — CEPHALEXIN 500 MG PO CAPS
500.0000 mg | ORAL_CAPSULE | Freq: Two times a day (BID) | ORAL | 2 refills | Status: DC
Start: 2023-06-19 — End: 2023-08-05

## 2023-06-19 NOTE — Assessment & Plan Note (Signed)
A1c

## 2023-06-19 NOTE — Assessment & Plan Note (Signed)
Coronary calcium score of 0. This is a low risk study. On Krill oil

## 2023-06-19 NOTE — Progress Notes (Signed)
Subjective:  Patient ID: Kayla Aguirre, female    DOB: 07-Sep-1951  Age: 72 y.o. MRN: [id number]  CC: Medical Management of Chronic Issues (3 month f/u )   HPI Shey P Colville presents for HTN, fatty liver, GERD  Outpatient Medications Prior to Visit  Medication Sig Dispense Refill   amLODIPine-Valsartan-HCTZ (EXFORGE HCT) 10-160-12.5 MG TABS Take 1 tablet by mouth daily. 90 tablet 3   Cholecalciferol (VITAMIN D3) 50 MCG (2000 UT) capsule Take 1 capsule (2,000 Units total) by mouth daily. 100 capsule 3   Cyanocobalamin (VITAMIN B-12) 1000 MCG SUBL Place 1 tablet (1,000 mcg total) under the tongue daily. 100 tablet 3   famotidine (PEPCID) 40 MG tablet Take 1 tablet (40 mg total) by mouth daily. 90 tablet 3   MegaRed Omega-3 Krill Oil 500 MG CAPS Take 1 capsule by mouth every morning. 100 capsule 3   triamcinolone cream (KENALOG) 0.1 % Apply 1 Application topically 2 (two) times daily as needed. 80 g 3   cephALEXin (KEFLEX) 500 MG capsule Take 1 capsule (500 mg total) by mouth 2 (two) times daily. Use prn bladder infection (Patient not taking: Reported on 03/16/2023) 12 capsule 2   No facility-administered medications prior to visit.    ROS: Review of Systems  Constitutional:  Negative for activity change, appetite change, chills, fatigue and unexpected weight change.  HENT:  Negative for congestion, mouth sores and sinus pressure.   Eyes:  Negative for visual disturbance.  Respiratory:  Negative for cough and chest tightness.   Gastrointestinal:  Negative for abdominal pain and nausea.  Genitourinary:  Negative for difficulty urinating, frequency and vaginal pain.  Musculoskeletal:  Negative for back pain and gait problem.  Skin:  Negative for pallor and rash.  Neurological:  Negative for dizziness, tremors, weakness, numbness and headaches.  Psychiatric/Behavioral:  Negative for confusion and sleep disturbance.     Objective:  BP 120/80 (BP Location: Left Arm, Patient  Position: Sitting, Cuff Size: Normal)   Pulse 88   Temp 98 F (36.7 C) (Oral)   Ht 5\' 5"  (1.651 m)   Wt 192 lb (87.1 kg)   SpO2 95%   BMI 31.95 kg/m   BP Readings from Last 3 Encounters:  06/19/23 120/80  03/16/23 122/82  12/08/22 118/80    Wt Readings from Last 3 Encounters:  06/19/23 192 lb (87.1 kg)  03/16/23 192 lb (87.1 kg)  12/08/22 200 lb (90.7 kg)    Physical Exam Constitutional:      General: She is not in acute distress.    Appearance: She is well-developed.  HENT:     Head: Normocephalic.     Right Ear: External ear normal.     Left Ear: External ear normal.     Nose: Nose normal.  Eyes:     General:        Right eye: No discharge.        Left eye: No discharge.     Conjunctiva/sclera: Conjunctivae normal.     Pupils: Pupils are equal, round, and reactive to light.  Neck:     Thyroid: No thyromegaly.     Vascular: No JVD.     Trachea: No tracheal deviation.  Cardiovascular:     Rate and Rhythm: Normal rate and regular rhythm.     Heart sounds: Normal heart sounds.  Pulmonary:     Effort: No respiratory distress.     Breath sounds: No stridor. No wheezing.  Abdominal:     General:  Bowel sounds are normal. There is no distension.     Palpations: Abdomen is soft. There is no mass.     Tenderness: There is no abdominal tenderness. There is no guarding or rebound.  Musculoskeletal:        General: No tenderness.     Cervical back: Normal range of motion and neck supple. No rigidity.  Lymphadenopathy:     Cervical: No cervical adenopathy.  Skin:    Findings: No erythema or rash.  Neurological:     Cranial Nerves: No cranial nerve deficit.     Motor: No abnormal muscle tone.     Coordination: Coordination normal.     Deep Tendon Reflexes: Reflexes normal.  Psychiatric:        Behavior: Behavior normal.        Thought Content: Thought content normal.        Judgment: Judgment normal.     Lab Results  Component Value Date   WBC 6.3 03/17/2023    HGB 15.2 (H) 03/17/2023   HCT 45.4 03/17/2023   PLT 235.0 03/17/2023   GLUCOSE 133 (H) 03/17/2023   CHOL 247 (H) 03/17/2023   TRIG 211.0 (H) 03/17/2023   HDL 49.20 03/17/2023   LDLDIRECT 169.0 03/17/2023   LDLCALC 172 (H) 10/24/2017   ALT 54 (H) 03/17/2023   AST 40 (H) 03/17/2023   NA 137 03/17/2023   K 4.5 03/17/2023   CL 103 03/17/2023   CREATININE 1.01 03/17/2023   BUN 18 03/17/2023   CO2 28 03/17/2023   TSH 5.75 (H) 02/22/2022   HGBA1C 6.4 03/17/2023    No results found.  Assessment & Plan:   Problem List Items Addressed This Visit     Hypothyroidism    Chronic - not taking Rx Monitor TSH      HTN (hypertension)    We discussed compliance, wt loss On Exforge HCT      Relevant Orders   Comprehensive metabolic panel   CBC with Differential/Platelet   Hemoglobin A1c   TSH   T4, free   VITAMIN D 25 Hydroxy (Vit-D Deficiency, Fractures)   Dyslipidemia    Coronary calcium score of 0. This is a low risk study. On Krill oil      Relevant Orders   Comprehensive metabolic panel   CBC with Differential/Platelet   Hemoglobin A1c   TSH   T4, free   VITAMIN D 25 Hydroxy (Vit-D Deficiency, Fractures)   B12 deficiency    On B12      Relevant Orders   Vitamin B12   Vitamin D deficiency    Vit D3 and K2      Relevant Orders   Vitamin B12   Insomnia    Vit D      Fatty liver - Primary    Check LFTs Start Milk thistle  Continue with weight loss      Relevant Orders   US Abdomen Complete   Hyperglycemia    A1c      Relevant Orders   Comprehensive metabolic panel   CBC with Differential/Platelet   Hemoglobin A1c   TSH   T4, free   VITAMIN D 25 Hydroxy (Vit-D Deficiency, Fractures)   Other Visit Diagnoses     Bladder infection       Relevant Medications   cephALEXin (KEFLEX) 500 MG capsule   Other Relevant Orders   Comprehensive metabolic panel   CBC with Differential/Platelet   Hemoglobin A1c   TSH   T4, free  VITAMIN D 25  Hydroxy (Vit-D Deficiency, Fractures)   US Abdomen Complete         Meds ordered this encounter  Medications   cephALEXin (KEFLEX) 500 MG capsule    Sig: Take 1 capsule (500 mg total) by mouth 2 (two) times daily. Use prn bladder infection    Dispense:  12 capsule    Refill:  2    For UTI      Follow-up: Return in about 3 months (around 09/18/2023) for a follow-up visit.  Sonda Primes, MD

## 2023-06-19 NOTE — Assessment & Plan Note (Signed)
Chronic - not taking Rx Monitor TSH

## 2023-06-19 NOTE — Assessment & Plan Note (Signed)
Vit D 

## 2023-06-19 NOTE — Assessment & Plan Note (Signed)
Vit D3 and K2 

## 2023-06-19 NOTE — Assessment & Plan Note (Signed)
We discussed compliance, wt loss On Exforge HCT

## 2023-06-19 NOTE — Assessment & Plan Note (Signed)
Check LFTs Start Milk thistle  Continue with weight loss

## 2023-06-19 NOTE — Assessment & Plan Note (Signed)
On B12 

## 2023-06-23 ENCOUNTER — Other Ambulatory Visit: Payer: Self-pay | Admitting: Internal Medicine

## 2023-06-23 DIAGNOSIS — Z1212 Encounter for screening for malignant neoplasm of rectum: Secondary | ICD-10-CM

## 2023-06-23 DIAGNOSIS — Z1211 Encounter for screening for malignant neoplasm of colon: Secondary | ICD-10-CM

## 2023-06-29 ENCOUNTER — Other Ambulatory Visit: Payer: Medicare HMO

## 2023-06-30 ENCOUNTER — Ambulatory Visit
Admission: RE | Admit: 2023-06-30 | Discharge: 2023-06-30 | Disposition: A | Discharge status: Home / Self Care | Payer: Medicare HMO | Source: Ambulatory Visit | Attending: Internal Medicine | Admitting: Internal Medicine

## 2023-06-30 DIAGNOSIS — K8020 Calculus of gallbladder without cholecystitis without obstruction: Secondary | ICD-10-CM | POA: Diagnosis not present

## 2023-06-30 DIAGNOSIS — K769 Liver disease, unspecified: Secondary | ICD-10-CM | POA: Diagnosis not present

## 2023-08-04 ENCOUNTER — Encounter (HOSPITAL_COMMUNITY): Payer: Self-pay | Admitting: Emergency Medicine

## 2023-08-04 ENCOUNTER — Emergency Department (HOSPITAL_COMMUNITY): Payer: Medicare HMO

## 2023-08-04 ENCOUNTER — Observation Stay (HOSPITAL_COMMUNITY)
Admission: EM | Admit: 2023-08-04 | Discharge: 2023-08-05 | Disposition: A | Discharge status: Home / Self Care | Payer: Medicare HMO | Attending: Internal Medicine | Admitting: Internal Medicine

## 2023-08-04 ENCOUNTER — Other Ambulatory Visit: Payer: Self-pay

## 2023-08-04 ENCOUNTER — Observation Stay (HOSPITAL_COMMUNITY): Payer: Medicare HMO

## 2023-08-04 DIAGNOSIS — E039 Hypothyroidism, unspecified: Secondary | ICD-10-CM | POA: Diagnosis not present

## 2023-08-04 DIAGNOSIS — B179 Acute viral hepatitis, unspecified: Secondary | ICD-10-CM | POA: Insufficient documentation

## 2023-08-04 DIAGNOSIS — I672 Cerebral atherosclerosis: Secondary | ICD-10-CM | POA: Diagnosis not present

## 2023-08-04 DIAGNOSIS — Z6832 Body mass index (BMI) 32.0-32.9, adult: Secondary | ICD-10-CM | POA: Diagnosis not present

## 2023-08-04 DIAGNOSIS — Z743 Need for continuous supervision: Secondary | ICD-10-CM | POA: Diagnosis not present

## 2023-08-04 DIAGNOSIS — I160 Hypertensive urgency: Secondary | ICD-10-CM | POA: Insufficient documentation

## 2023-08-04 DIAGNOSIS — R110 Nausea: Secondary | ICD-10-CM | POA: Diagnosis not present

## 2023-08-04 DIAGNOSIS — D171 Benign lipomatous neoplasm of skin and subcutaneous tissue of trunk: Secondary | ICD-10-CM | POA: Diagnosis not present

## 2023-08-04 DIAGNOSIS — R079 Chest pain, unspecified: Secondary | ICD-10-CM | POA: Diagnosis not present

## 2023-08-04 DIAGNOSIS — R42 Dizziness and giddiness: Principal | ICD-10-CM

## 2023-08-04 DIAGNOSIS — E669 Obesity, unspecified: Secondary | ICD-10-CM | POA: Diagnosis not present

## 2023-08-04 DIAGNOSIS — I639 Cerebral infarction, unspecified: Secondary | ICD-10-CM | POA: Diagnosis not present

## 2023-08-04 DIAGNOSIS — R7989 Other specified abnormal findings of blood chemistry: Secondary | ICD-10-CM | POA: Diagnosis not present

## 2023-08-04 DIAGNOSIS — I6782 Cerebral ischemia: Secondary | ICD-10-CM | POA: Diagnosis not present

## 2023-08-04 DIAGNOSIS — K759 Inflammatory liver disease, unspecified: Secondary | ICD-10-CM

## 2023-08-04 DIAGNOSIS — R0989 Other specified symptoms and signs involving the circulatory and respiratory systems: Secondary | ICD-10-CM | POA: Diagnosis not present

## 2023-08-04 DIAGNOSIS — Z79899 Other long term (current) drug therapy: Secondary | ICD-10-CM | POA: Diagnosis not present

## 2023-08-04 DIAGNOSIS — I6523 Occlusion and stenosis of bilateral carotid arteries: Secondary | ICD-10-CM | POA: Diagnosis not present

## 2023-08-04 DIAGNOSIS — I10 Essential (primary) hypertension: Secondary | ICD-10-CM | POA: Diagnosis not present

## 2023-08-04 DIAGNOSIS — R29818 Other symptoms and signs involving the nervous system: Secondary | ICD-10-CM | POA: Diagnosis not present

## 2023-08-04 LAB — CBC WITH DIFFERENTIAL/PLATELET
Abs Immature Granulocytes: 0.03 10*3/uL (ref 0.00–0.07)
Basophils Absolute: 0.1 10*3/uL (ref 0.0–0.1)
Basophils Relative: 1 %
Eosinophils Absolute: 0.1 10*3/uL (ref 0.0–0.5)
Eosinophils Relative: 2 %
HCT: 45 % (ref 36.0–46.0)
Hemoglobin: 15.5 g/dL — ABNORMAL HIGH (ref 12.0–15.0)
Immature Granulocytes: 0 %
Lymphocytes Relative: 18 %
Lymphs Abs: 1.5 10*3/uL (ref 0.7–4.0)
MCH: 29 pg (ref 26.0–34.0)
MCHC: 34.4 g/dL (ref 30.0–36.0)
MCV: 84.3 fL (ref 80.0–100.0)
Monocytes Absolute: 0.3 10*3/uL (ref 0.1–1.0)
Monocytes Relative: 4 %
Neutro Abs: 6.4 10*3/uL (ref 1.7–7.7)
Neutrophils Relative %: 75 %
Platelets: 227 10*3/uL (ref 150–400)
RBC: 5.34 MIL/uL — ABNORMAL HIGH (ref 3.87–5.11)
RDW: 12.7 % (ref 11.5–15.5)
WBC: 8.5 10*3/uL (ref 4.0–10.5)
nRBC: 0 % (ref 0.0–0.2)

## 2023-08-04 LAB — I-STAT CHEM 8, ED
BUN: 16 mg/dL (ref 8–23)
Calcium, Ion: 1.15 mmol/L (ref 1.15–1.40)
Chloride: 104 mmol/L (ref 98–111)
Creatinine, Ser: 0.9 mg/dL (ref 0.44–1.00)
Glucose, Bld: 168 mg/dL — ABNORMAL HIGH (ref 70–99)
HCT: 47 % — ABNORMAL HIGH (ref 36.0–46.0)
Hemoglobin: 16 g/dL — ABNORMAL HIGH (ref 12.0–15.0)
Potassium: 3.7 mmol/L (ref 3.5–5.1)
Sodium: 137 mmol/L (ref 135–145)
TCO2: 24 mmol/L (ref 22–32)

## 2023-08-04 LAB — URINALYSIS, ROUTINE W REFLEX MICROSCOPIC
Bilirubin Urine: NEGATIVE
Glucose, UA: NEGATIVE mg/dL
Hgb urine dipstick: NEGATIVE
Ketones, ur: NEGATIVE mg/dL
Leukocytes,Ua: NEGATIVE
Nitrite: NEGATIVE
Protein, ur: NEGATIVE mg/dL
Specific Gravity, Urine: 1.016 (ref 1.005–1.030)
pH: 6 (ref 5.0–8.0)

## 2023-08-04 LAB — COMPREHENSIVE METABOLIC PANEL
ALT: 61 U/L — ABNORMAL HIGH (ref 0–44)
AST: 49 U/L — ABNORMAL HIGH (ref 15–41)
Albumin: 4.2 g/dL (ref 3.5–5.0)
Alkaline Phosphatase: 86 U/L (ref 38–126)
Anion gap: 11 (ref 5–15)
BUN: 16 mg/dL (ref 8–23)
CO2: 23 mmol/L (ref 22–32)
Calcium: 9 mg/dL (ref 8.9–10.3)
Chloride: 100 mmol/L (ref 98–111)
Creatinine, Ser: 0.79 mg/dL (ref 0.44–1.00)
GFR, Estimated: >60 mL/min (ref >60)
Glucose, Bld: 166 mg/dL — ABNORMAL HIGH (ref 70–99)
Potassium: 3.5 mmol/L (ref 3.5–5.1)
Sodium: 134 mmol/L — ABNORMAL LOW (ref 135–145)
Total Bilirubin: 1.1 mg/dL (ref <1.2)
Total Protein: 7.5 g/dL (ref 6.5–8.1)

## 2023-08-04 LAB — RAPID URINE DRUG SCREEN, HOSP PERFORMED
Amphetamines: NOT DETECTED
Barbiturates: NOT DETECTED
Benzodiazepines: NOT DETECTED
Cocaine: NOT DETECTED
Opiates: NOT DETECTED
Tetrahydrocannabinol: NOT DETECTED

## 2023-08-04 LAB — TROPONIN I (HIGH SENSITIVITY)
Troponin I (High Sensitivity): 3 ng/L (ref <18)
Troponin I (High Sensitivity): 3 ng/L (ref <18)

## 2023-08-04 LAB — CBC
HCT: 45.4 % (ref 36.0–46.0)
Hemoglobin: 15.6 g/dL — ABNORMAL HIGH (ref 12.0–15.0)
MCH: 28.7 pg (ref 26.0–34.0)
MCHC: 34.4 g/dL (ref 30.0–36.0)
MCV: 83.5 fL (ref 80.0–100.0)
Platelets: 224 10*3/uL (ref 150–400)
RBC: 5.44 MIL/uL — ABNORMAL HIGH (ref 3.87–5.11)
RDW: 12.5 % (ref 11.5–15.5)
WBC: 9.7 10*3/uL (ref 4.0–10.5)
nRBC: 0 % (ref 0.0–0.2)

## 2023-08-04 LAB — CBG MONITORING, ED: Glucose-Capillary: 144 mg/dL — ABNORMAL HIGH (ref 70–99)

## 2023-08-04 LAB — ETHANOL: Alcohol, Ethyl (B): <10 mg/dL (ref <10)

## 2023-08-04 LAB — CREATININE, SERUM
Creatinine, Ser: 0.73 mg/dL (ref 0.44–1.00)
GFR, Estimated: >60 mL/min (ref >60)

## 2023-08-04 LAB — PROTIME-INR
INR: 0.9 (ref 0.8–1.2)
Prothrombin Time: 12.8 s (ref 11.4–15.2)

## 2023-08-04 LAB — APTT: aPTT: 30 s (ref 24–36)

## 2023-08-04 MED ORDER — MECLIZINE HCL 25 MG PO TABS
25.0000 mg | ORAL_TABLET | Freq: Three times a day (TID) | ORAL | Status: DC
  Administered 2023-08-04 – 2023-08-05 (×2): 25 mg via ORAL
  Filled 2023-08-04 (×2): qty 1

## 2023-08-04 MED ORDER — LACTATED RINGERS IV BOLUS
1000.0000 mL | Freq: Once | INTRAVENOUS | Status: AC
  Administered 2023-08-04: 1000 mL via INTRAVENOUS

## 2023-08-04 MED ORDER — SENNOSIDES-DOCUSATE SODIUM 8.6-50 MG PO TABS: 1.0000 | ORAL_TABLET | Freq: Every evening | ORAL | Status: DC | PRN

## 2023-08-04 MED ORDER — IOHEXOL 350 MG/ML SOLN
80.0000 mL | Freq: Once | INTRAVENOUS | Status: AC | PRN
  Administered 2023-08-04: 80 mL via INTRAVENOUS

## 2023-08-04 MED ORDER — ONDANSETRON HCL 4 MG/2ML IJ SOLN
4.0000 mg | Freq: Once | INTRAMUSCULAR | Status: AC
  Administered 2023-08-04: 4 mg via INTRAVENOUS
  Filled 2023-08-04: qty 2

## 2023-08-04 MED ORDER — ACETAMINOPHEN 650 MG RE SUPP: 650.0000 mg | RECTAL | Status: DC | PRN

## 2023-08-04 MED ORDER — ENOXAPARIN SODIUM 40 MG/0.4ML IJ SOSY
40.0000 mg | PREFILLED_SYRINGE | INTRAMUSCULAR | Status: DC
  Administered 2023-08-05: 40 mg via SUBCUTANEOUS
  Filled 2023-08-04: qty 0.4

## 2023-08-04 MED ORDER — ACETAMINOPHEN 160 MG/5ML PO SOLN: 650.0000 mg | ORAL | Status: DC | PRN

## 2023-08-04 MED ORDER — STROKE: EARLY STAGES OF RECOVERY BOOK
Freq: Once | Status: DC
  Filled 2023-08-04: qty 1

## 2023-08-04 MED ORDER — ACETAMINOPHEN 325 MG PO TABS: 650.0000 mg | ORAL_TABLET | ORAL | Status: DC | PRN

## 2023-08-04 MED ORDER — FAMOTIDINE 20 MG PO TABS
40.0000 mg | ORAL_TABLET | Freq: Every day | ORAL | Status: DC
  Administered 2023-08-04 – 2023-08-05 (×2): 40 mg via ORAL
  Filled 2023-08-04 (×2): qty 2

## 2023-08-04 NOTE — ED Triage Notes (Signed)
Patient presents with dizziness. She feels that the room is spinning and is nauseous. She had an episode of this years ago but did not get a diagnosis.    EMS vitals: 180/90 BP 98 HR 98% SPO2 on room air 114 CBG

## 2023-08-04 NOTE — Consult Note (Signed)
[facility] Telemedicine Consult   Requesting Provider: Dr. Earlene Plater  Chief Complaint: vertigo, dizziness, nausea  HPI:  72 year old female with history of hypertension, hyperlipidemia, hypothyroidism, B12 deficiency presented to ED for code stroke.  Per patient around 2 PM, she was on computer, had a sudden onset dizziness, spinning sensation, with nausea but no vomiting.  She checked her BP 196/107.  She had difficulty walking, her husband has to hold her to walk.  She also complaining of some numbness in the lips and the bilateral jaw area.  Denies headache but felt heart palpitation.  EMS called and she was sent to ER for evaluation.  CT no acute abnormality.  On my examination, patient lethargic still has dizziness with head motion, however no nystagmus, limb ataxia or eye discoloration.  Cannot walk but sitting up in the bed no truncal ataxia.  Patient not taking blood thinner at home.  LKW: 2 PM tpa given?: No, no focal deficit IR Thrombectomy? No, no sign of LVO Modified Rankin Scale: 0-Completely asymptomatic and back to baseline post- stroke   Exam: Vitals:   08/04/23 1656 08/04/23 1700  BP: (!) 168/106 (!) 159/93  Pulse: 89 87  Resp: 17 20  Temp:    SpO2: 96% 95%     Temp:  [97.8 F (36.6 C)] 97.8 F (36.6 C) (11/15 1646) Pulse Rate:  [87-89] 87 (11/15 1700) Resp:  [17-20] 20 (11/15 1700) BP: (159-168)/(93-106) 159/93 (11/15 1700) SpO2:  [95 %-98 %] 95 % (11/15 1700)  General - Well nourished, well developed, in no apparent distress, mild lethargy.  Ophthalmologic - fundi not visualized due to noncooperation.  Cardiovascular - Regular rhythm and rate.  Neuro - awake, alert, eyes open, orientated to age, place, time. No aphasia, fluent language, following all simple commands. Able to name and repeat. No gaze palsy, tracking bilaterally, visual field full, PERRL, no nystagmus or eye dislocation. No facial droop. Tongue midline. Bilateral UEs 5/5, no drift.  Bilaterally LEs 5/5, no drift. Sensation symmetrical bilaterally, b/l FTN intact, patient not willing to get out of bed walking due to dizziness, however sitting up straight in bed, no total ataxia.  NIH Stroke Scale = 0  Imaging Reviewed:  CT HEAD CODE STROKE WO CONTRAST  Result Date: 08/04/2023 CLINICAL DATA:  Code stroke. Neuro deficit, acute, stroke suspected. Patient feels the room is spinning. She is nauseated. EXAM: CT HEAD WITHOUT CONTRAST TECHNIQUE: Contiguous axial images were obtained from the base of the skull through the vertex without intravenous contrast. RADIATION DOSE REDUCTION: This exam was performed according to the departmental dose-optimization program which includes automated exposure control, adjustment of the mA and/or kV according to patient size and/or use of iterative reconstruction technique. COMPARISON:  None Available. FINDINGS: Brain: No acute infarct, hemorrhage, or mass lesion is present. No significant white matter lesions are present. Deep brain nuclei are within normal limits. The ventricles are of normal size. No significant extraaxial fluid collection is present. The brainstem and cerebellum are within normal limits. Midline structures are within normal limits. Vascular: No hyperdense vessel or unexpected calcification. Skull: No significant extracranial soft tissue lesion is present. Calvarium is intact. No focal lytic or blastic lesions are present. Hyperostosis is noted. Sinuses/Orbits: The paranasal sinuses and mastoid air cells are clear. The globes and orbits are within normal limits. ASPECTS [facility] Stroke Program Early CT Score) - Ganglionic level infarction (caudate, lentiform nuclei, internal capsule, insula, M1-M3 cortex): 7/7 - Supraganglionic infarction (M4-M6 cortex): 3/3 Total score (0-10 with 10 being  normal): 10/10 IMPRESSION: 1. Normal head CT. 2. Aspects is 10/10. The above was relayed via text pager to Dr. Roda Shutters on 08/04/2023 at 16:50 . Electronically  Signed   By: Marin Roberts M.D.   On: 08/04/2023 16:52     Labs reviewed in epic and pertinent values follow: AST/ALT 49/61, platelet 2027, hemoglobin 15.5  Assessment:  72 year old female with history of hypertension, hyperlipidemia, hypothyroidism, B12 deficiency presented to ED for sudden onset dizziness, spinning sensation, with nausea but no vomiting, and difficulty walking. She also complaining of some numbness in the lips and the bilateral jaw area.  Denies headache but felt heart palpitation. On my examination, patient lethargic still has dizziness with head motion, however no nystagmus, limb ataxia or eye discoloration.  Cannot walk but sitting up in the bed no truncal ataxia.  NIHSS = 0. CT no acute abnormality.   Not TNK candidate due to NIHSS = 0, no focal deficit on exam. Not IR candidate given no sign of LVO. Will recommend further workup with routine CT head and neck and MRI, as well as 2D echo.  Recommend medicine admission, ideally transferred to [facility].  Recommend aspirin.  Recommendations:  Continue further stroke work up  Frequent neuro checks Telemetry monitoring MRI brain  CTA head and neck Echocardiogram  UDS, fasting lipid panel and HgbA1C PT/OT/speech consult Permissive hypertension (only treat if BP > 220/120 unless a lower blood pressure is clinically necessary) for 24-48 hours post stroke onset GI and DVT prophylaxis  ASA PR 300 mg if no po access or ASA 325 mg if passed swallow screening Stroke risk factor modification Discussed with Dr. Earlene Plater ED physician We will follow   Consult Participants: RN, patient, me Location of the provider: [facility] Location of the patient: [facility]  Time Code Stroke Page received: 4:32 PM Time neurologist arrived: 4:35 PM Time NIHSS completed: 4:51 PM  This consult was provided via telemedicine with 2-way video and audio communication. The patient/family was informed that care would be provided in this way and agreed to receive  care in this manner.   This patient is receiving care for possible acute neurological changes. There was 60 minutes of care by this provider at the time of service, including time for direct evaluation via telemedicine, review of medical records, imaging studies and discussion of findings with providers, the patient and/or family.  Marvel Plan, MD PhD Stroke Neurology 08/04/2023 6:09 PM

## 2023-08-04 NOTE — Progress Notes (Signed)
Telestroke cart was activated at 1629. Per treatment team, pt's LKW was at 1400 with c/o dizziness. Dr. Earlene Plater, EDP at bedside assessing patient prior to cart activation. Pt transported to CT at 1632 and returned to room at 1640. [facility] was paged for code stroke at 1630. [facility] received call from Dr. Iver Nestle to page Dr. Roda Shutters. [facility] page Dr. Roda Shutters at [phone]. Dr. Roda Shutters, [facility] appeared on telestroke cart at 1636 to assess the patient. Based on [facility]'s assessment, pt does not meet criteria for emergent interventions at this time 2/2 no focal deficits. [facility] to f/u with EDP regarding recommendations. No further needs from Telestroke RN at this time. Telestroke cart disconnected at 1706.    MRS- 0

## 2023-08-04 NOTE — Assessment & Plan Note (Signed)
Minimal chronic. Outpatient follow up.

## 2023-08-04 NOTE — Assessment & Plan Note (Addendum)
Second episode in about 10 years, acute onset.  At this time I will treat with meclizine.  Need to rule out central nervous lesion.  Will order MRI stat.  Code stroke was called in the ER, neurology has been engaged by ER provider.  I will order stroke protocol/order set.  However I will not investigate this further unless we confirm a CNS lesion with MRI.  Till then I will liberalize HTN control

## 2023-08-04 NOTE — H&P (Signed)
History and Physical    Patient: Kayla Aguirre XBJ:[id number] DOB: 1951/09/18 DOA: 08/04/2023 DOS: the patient was seen and examined on 08/04/2023 PCP: Plotnikov, Georgina Quint, MD  Patient coming from: Home  Chief Complaint:  Chief Complaint  Patient presents with   Dizziness   HPI: Kayla Aguirre is a 72 y.o. female with medical history significant of hypretension, reported hypothyroidism and dyslipidemia.  Patient reports being in her usual state of health till approximately 2 PM when she was resting in her chair, almost asleep, when she woke up she had a sudden sensation of spinning, and extreme nausea but no vomiting.  She had no headache no vision changes, patient reports a subjective numbness/tingling sensation periorally.  No ear pain or discharge or hearing changes.  Patient reports that movement changes since then have caused her to have recurrent episodes of severe vertigo.  There is no focal weakness no loss of consciousness reported.  Patient came to [facility] long ER, where patient had severe nausea, retching as well as vertiginous episodes.  Since then patient is keeping very still to avoid having recurrent vertigo.  Otherwise at this time no complatins.  Patient recalls similar episode approximately in the year 2009 which was self-limited. Review of Systems: As mentioned in the history of present illness. All other systems reviewed and are negative. Past Medical History:  Diagnosis Date   Hypertension    History reviewed. No pertinent surgical history. Social History:  reports that she has never smoked. She has never used smokeless tobacco. She reports that she does not drink alcohol and does not use drugs.  Allergies  Allergen Reactions   Erythromycin    Erythromycin Base    Hydrochlorothiazide     Cramps on 25 mg, ok on 12.5 mg    Family History  Problem Relation Age of Onset   Diabetes Father    Stroke Father    Myelodysplastic syndrome Mother    Cancer  Mother        MM   Colon cancer Neg Hx    Colon polyps Neg Hx    Kidney disease Neg Hx     Prior to Admission medications   Medication Sig Start Date End Date Taking? Authorizing Provider  amLODIPine-Valsartan-HCTZ (EXFORGE HCT) 10-160-12.5 MG TABS Take 1 tablet by mouth daily. 03/16/23  Yes Plotnikov, Georgina Quint, MD  famotidine (PEPCID) 40 MG tablet Take 1 tablet (40 mg total) by mouth daily. 03/16/23  Yes Plotnikov, Georgina Quint, MD  triamcinolone cream (KENALOG) 0.1 % Apply 1 Application topically 2 (two) times daily as needed. Patient taking differently: Apply 1 Application topically 2 (two) times daily as needed (insect bites). 09/08/22  Yes Plotnikov, Georgina Quint, MD  cephALEXin (KEFLEX) 500 MG capsule Take 1 capsule (500 mg total) by mouth 2 (two) times daily. Use prn bladder infection Patient not taking: Reported on 08/04/2023 06/19/23   Plotnikov, Georgina Quint, MD  Cholecalciferol (VITAMIN D3) 50 MCG (2000 UT) capsule Take 1 capsule (2,000 Units total) by mouth daily. Patient not taking: Reported on 08/04/2023 10/01/19   Plotnikov, Georgina Quint, MD  Cyanocobalamin (VITAMIN B-12) 1000 MCG SUBL Place 1 tablet (1,000 mcg total) under the tongue daily. Patient not taking: Reported on 08/04/2023 10/01/19   Plotnikov, Georgina Quint, MD  MegaRed Omega-3 Krill Oil 500 MG CAPS Take 1 capsule by mouth every morning. Patient not taking: Reported on 08/04/2023 04/13/21   Plotnikov, Georgina Quint, MD    Physical Exam: Vitals:   08/04/23 1646 08/04/23 1656  08/04/23 1700 08/04/23 1815  BP: (!) 168/106 (!) 168/106 (!) 159/93 (!) 163/93  Pulse: 89 89 87 91  Resp: 20 17 20 14   Temp: 97.8 F (36.6 C)     TempSrc: Oral     SpO2: 98% 96% 95% 96%   General: Patient is alert and awake holding her head pretty still in the bed.  No distress apparent.  Husband at the bedside Respiratory exam: Bilateral intravesicular, patient examined and recommend position Cardiovascular exam S1-S2 normal Abdomen all quadrants soft  nontender Extremities warm without edema Neuromuscular exam no focal motor deficit in extremities, no nystagmus symmetric facies.  I did not set the patient up in order to avoid precipitating nystagmus/vertigo. Data Reviewed:  Labs on Admission:  Results for orders placed or performed during the hospital encounter of 08/04/23 (from the past 24 hour(s))  POC CBG, ED     Status: Abnormal   Collection Time: 08/04/23  4:51 PM  Result Value Ref Range   Glucose-Capillary 144 (H) 70 - 99 mg/dL  I-stat chem 8, ED     Status: Abnormal   Collection Time: 08/04/23  5:12 PM  Result Value Ref Range   Sodium 137 135 - 145 mmol/L   Potassium 3.7 3.5 - 5.1 mmol/L   Chloride 104 98 - 111 mmol/L   BUN 16 8 - 23 mg/dL   Creatinine, Ser 6.57 0.44 - 1.00 mg/dL   Glucose, Bld 846 (H) 70 - 99 mg/dL   Calcium, Ion 9.62 9.52 - 1.40 mmol/L   TCO2 24 22 - 32 mmol/L   Hemoglobin 16.0 (H) 12.0 - 15.0 g/dL   HCT 84.1 (H) 32.4 - 40.1 %  Ethanol     Status: None   Collection Time: 08/04/23  5:12 PM  Result Value Ref Range   Alcohol, Ethyl (B) <10 <10 mg/dL  Protime-INR     Status: None   Collection Time: 08/04/23  5:16 PM  Result Value Ref Range   Prothrombin Time 12.8 11.4 - 15.2 seconds   INR 0.9 0.8 - 1.2  APTT     Status: None   Collection Time: 08/04/23  5:16 PM  Result Value Ref Range   aPTT 30 24 - 36 seconds  Comprehensive metabolic panel     Status: Abnormal   Collection Time: 08/04/23  5:16 PM  Result Value Ref Range   Sodium 134 (L) 135 - 145 mmol/L   Potassium 3.5 3.5 - 5.1 mmol/L   Chloride 100 98 - 111 mmol/L   CO2 23 22 - 32 mmol/L   Glucose, Bld 166 (H) 70 - 99 mg/dL   BUN 16 8 - 23 mg/dL   Creatinine, Ser 0.27 0.44 - 1.00 mg/dL   Calcium 9.0 8.9 - 25.3 mg/dL   Total Protein 7.5 6.5 - 8.1 g/dL   Albumin 4.2 3.5 - 5.0 g/dL   AST 49 (H) 15 - 41 U/L   ALT 61 (H) 0 - 44 U/L   Alkaline Phosphatase 86 38 - 126 U/L   Total Bilirubin 1.1 <1.2 mg/dL   GFR, Estimated >66 >44 mL/min    Anion gap 11 5 - 15  CBC WITH DIFFERENTIAL     Status: Abnormal   Collection Time: 08/04/23  5:16 PM  Result Value Ref Range   WBC 8.5 4.0 - 10.5 K/uL   RBC 5.34 (H) 3.87 - 5.11 MIL/uL   Hemoglobin 15.5 (H) 12.0 - 15.0 g/dL   HCT 03.4 74.2 - 59.5 %   MCV  84.3 80.0 - 100.0 fL   MCH 29.0 26.0 - 34.0 pg   MCHC 34.4 30.0 - 36.0 g/dL   RDW 40.9 81.1 - 91.4 %   Platelets 227 150 - 400 K/uL   nRBC 0.0 0.0 - 0.2 %   Neutrophils Relative % 75 %   Neutro Abs 6.4 1.7 - 7.7 K/uL   Lymphocytes Relative 18 %   Lymphs Abs 1.5 0.7 - 4.0 K/uL   Monocytes Relative 4 %   Monocytes Absolute 0.3 0.1 - 1.0 K/uL   Eosinophils Relative 2 %   Eosinophils Absolute 0.1 0.0 - 0.5 K/uL   Basophils Relative 1 %   Basophils Absolute 0.1 0.0 - 0.1 K/uL   Immature Granulocytes 0 %   Abs Immature Granulocytes 0.03 0.00 - 0.07 K/uL  Troponin I (High Sensitivity)     Status: None   Collection Time: 08/04/23  5:16 PM  Result Value Ref Range   Troponin I (High Sensitivity) 3 <18 ng/L  Urinalysis, Routine w reflex microscopic -Urine, Clean Catch     Status: Abnormal   Collection Time: 08/04/23  7:22 PM  Result Value Ref Range   Color, Urine STRAW (A) YELLOW   APPearance CLEAR CLEAR   Specific Gravity, Urine 1.016 1.005 - 1.030   pH 6.0 5.0 - 8.0   Glucose, UA NEGATIVE NEGATIVE mg/dL   Hgb urine dipstick NEGATIVE NEGATIVE   Bilirubin Urine NEGATIVE NEGATIVE   Ketones, ur NEGATIVE NEGATIVE mg/dL   Protein, ur NEGATIVE NEGATIVE mg/dL   Nitrite NEGATIVE NEGATIVE   Leukocytes,Ua NEGATIVE NEGATIVE   Basic Metabolic Panel: Recent Labs  Lab 08/04/23 1712 08/04/23 1716  NA 137 134*  K 3.7 3.5  CL 104 100  CO2  --  23  GLUCOSE 168* 166*  BUN 16 16  CREATININE 0.90 0.79  CALCIUM  --  9.0   Liver Function Tests: Recent Labs  Lab 08/04/23 1716  AST 49*  ALT 61*  ALKPHOS 86  BILITOT 1.1  PROT 7.5  ALBUMIN 4.2   No results for input(s): "LIPASE", "AMYLASE" in the last 168 hours. No results for  input(s): "AMMONIA" in the last 168 hours. CBC: Recent Labs  Lab 08/04/23 1712 08/04/23 1716  WBC  --  8.5  NEUTROABS  --  6.4  HGB 16.0* 15.5*  HCT 47.0* 45.0  MCV  --  84.3  PLT  --  227   Cardiac Enzymes: Recent Labs  Lab 08/04/23 1716  TROPONINIHS 3    BNP (last 3 results) No results for input(s): "PROBNP" in the last 8760 hours. CBG: Recent Labs  Lab 08/04/23 1651  GLUCAP 144*    Radiological Exams on Admission:  DG CHEST PORT 1 VIEW  Result Date: 08/04/2023 CLINICAL DATA:  Chest pain, dizziness, and nausea EXAM: PORTABLE CHEST 1 VIEW COMPARISON:  None Available. FINDINGS: Single frontal view of the chest demonstrates an unremarkable cardiac silhouette. Lung volumes are diminished, with crowding the central vasculature. No acute airspace disease, effusion, or pneumothorax. No acute bony abnormality. IMPRESSION: 1. Low lung volumes.  No acute airspace disease. Electronically Signed   By: Sharlet Salina M.D.   On: 08/04/2023 19:23   CT Angio Head Neck W WO CM  Result Date: 08/04/2023 CLINICAL DATA:  Neuro deficit, stroke suspected. Dizziness. Patient feels like rim is pending. Nausea. EXAM: CT ANGIOGRAPHY HEAD AND NECK WITH AND WITHOUT CONTRAST TECHNIQUE: Multidetector CT imaging of the head and neck was performed using the standard protocol during bolus administration of  intravenous contrast. Multiplanar CT image reconstructions and MIPs were obtained to evaluate the vascular anatomy. Carotid stenosis measurements (when applicable) are obtained utilizing NASCET criteria, using the distal internal carotid diameter as the denominator. RADIATION DOSE REDUCTION: This exam was performed according to the departmental dose-optimization program which includes automated exposure control, adjustment of the mA and/or kV according to patient size and/or use of iterative reconstruction technique. CONTRAST:  80mL OMNIPAQUE IOHEXOL 350 MG/ML SOLN COMPARISON:  None Available. FINDINGS: CTA  NECK FINDINGS Aortic arch: Atherosclerotic calcifications are present in the distal aortic arch beyond the great vessel origins. No aneurysm or stenosis is present. Right carotid system: The right common carotid artery is within normal limits. Atherosclerotic changes are present at the proximal right ICA without significant stenosis relative to the more distal vessel. Cervical right ICA is otherwise normal. Left carotid system: The left common carotid artery is within normal limits. Irregular noncalcified plaque is present proximal left ICA without a significant stenosis relative to the more distal vessel. The more distal cervical left ICA is normal. Vertebral arteries: The vertebral arteries are codominant. Both vertebral arteries originate from the subclavian arteries. No significant stenosis is present in either vertebral artery in the neck. The vertebral body heights and alignment are normal. Skeleton: Vertebral body heights and alignment are normal. No focal osseous lesions are present. Other neck: Soft tissues the neck are otherwise unremarkable. Salivary glands are within normal limits. Thyroid is normal. No significant adenopathy is present. No focal mucosal or submucosal lesions are present. Upper chest: A subpleural lipoma is noted at the right lung apex. Review of the MIP images confirms the above findings CTA HEAD FINDINGS Anterior circulation: The internal carotid arteries are within normal limits from the skull base to the ICA termini. The A1 and M1 segments are normal. MCA bifurcations are within normal limits. The ACA and MCA branch vessels are normal. No aneurysm is present. Posterior circulation: The vertebral arteries are codominant. Vertebrobasilar junction and basilar artery normal. The superior cerebellar arteries are patent bilaterally. The right posterior cerebral artery originates from basilar tip. The left posterior cerebral artery is of fetal type. PCA branch vessels are within normal  limits bilaterally. No aneurysm is present. Venous sinuses: The dural sinuses are patent. The straight sinus and deep cerebral veins are intact. Cortical veins are within normal limits. No significant vascular malformation is evident. Anatomic variants: Fetal type left posterior cerebral artery. Review of the MIP images confirms the above findings IMPRESSION: 1. No emergent large vessel occlusion. 2. Atherosclerotic changes at the proximal internal carotid arteries bilaterally without significant stenosis relative to the more distal vessels. 3. Normal variant CTA Circle of Willis without significant proximal stenosis, aneurysm, or branch vessel occlusion. 4.  Aortic Atherosclerosis (ICD10-I70.0). Electronically Signed   By: Marin Roberts M.D.   On: 08/04/2023 19:18   CT HEAD CODE STROKE WO CONTRAST  Result Date: 08/04/2023 CLINICAL DATA:  Code stroke. Neuro deficit, acute, stroke suspected. Patient feels the room is spinning. She is nauseated. EXAM: CT HEAD WITHOUT CONTRAST TECHNIQUE: Contiguous axial images were obtained from the base of the skull through the vertex without intravenous contrast. RADIATION DOSE REDUCTION: This exam was performed according to the departmental dose-optimization program which includes automated exposure control, adjustment of the mA and/or kV according to patient size and/or use of iterative reconstruction technique. COMPARISON:  None Available. FINDINGS: Brain: No acute infarct, hemorrhage, or mass lesion is present. No significant white matter lesions are present. Deep brain nuclei are within  normal limits. The ventricles are of normal size. No significant extraaxial fluid collection is present. The brainstem and cerebellum are within normal limits. Midline structures are within normal limits. Vascular: No hyperdense vessel or unexpected calcification. Skull: No significant extracranial soft tissue lesion is present. Calvarium is intact. No focal lytic or blastic lesions  are present. Hyperostosis is noted. Sinuses/Orbits: The paranasal sinuses and mastoid air cells are clear. The globes and orbits are within normal limits. ASPECTS [facility] Stroke Program Early CT Score) - Ganglionic level infarction (caudate, lentiform nuclei, internal capsule, insula, M1-M3 cortex): 7/7 - Supraganglionic infarction (M4-M6 cortex): 3/3 Total score (0-10 with 10 being normal): 10/10 IMPRESSION: 1. Normal head CT. 2. Aspects is 10/10. The above was relayed via text pager to Dr. Roda Shutters on 08/04/2023 at 16:50 . Electronically Signed   By: Marin Roberts M.D.   On: 08/04/2023 16:52    EKG: Independently reviewed. NSR   Assessment and Plan: * Vertigo Second episode in about 10 years, acute onset.  At this time I will treat with meclizine.  Need to rule out central nervous lesion.  Will order MRI stat.  Code stroke was called in the ER, neurology has been engaged by ER provider.  I will order stroke protocol/order set.  However I will not investigate this further unless we confirm a CNS lesion with MRI.  Till then I will liberalize HTN control  Hepatitis Minimal chronic. Outpatient follow up.      Advance Care Planning:   Code Status: Not on file fulll code.  Consults: code stroke protocol  Family Communication: discussed with husband at bedside. All questions answered.  Severity of Illness: The appropriate patient status for this patient is OBSERVATION. Observation status is judged to be reasonable and necessary in order to provide the required intensity of service to ensure the patient's safety. The patient's presenting symptoms, physical exam findings, and initial radiographic and laboratory data in the context of their medical condition is felt to place them at decreased risk for further clinical deterioration. Furthermore, it is anticipated that the patient will be medically stable for discharge from the hospital within 2 midnights of admission.   Author: Nolberto Hanlon,  MD 08/04/2023 7:55 PM  For on call review [email or web address].

## 2023-08-04 NOTE — ED Provider Notes (Signed)
[place] EMERGENCY DEPARTMENT AT [facility] Provider Note   CSN: [id number] Arrival date & time: 08/04/23  2440     History  Chief Complaint  Patient presents with   Dizziness    Kayla Aguirre is a 72 y.o. female.  HPI 72 year old female history of hypertension, hypothyroidism, hyperlipidemia presenting for dizziness.  She states around 2 PM she was sitting her desk when she had sudden onset severe dizziness.  She thought she may have lost consciousness x 2.  Dizziness is described as spinning.  She notes that vertigo somewhat similar years ago that resolved with this is much worse.  It has been constant and worsening since then.  She has mild headache as well as nausea.  Some vomiting.  She is noted some slight generalized chest pain that started at 15 minutes after this.  No vision changes or weakness or numbness.  When she tries to walk she is very off balance and feel like she is going to fall.  She denies any prior strokes.     Home Medications Prior to Admission medications   Medication Sig Start Date End Date Taking? Authorizing Provider  amLODIPine-Valsartan-HCTZ (EXFORGE HCT) 10-160-12.5 MG TABS Take 1 tablet by mouth daily. 03/16/23  Yes Plotnikov, Georgina Quint, MD  famotidine (PEPCID) 40 MG tablet Take 1 tablet (40 mg total) by mouth daily. 03/16/23  Yes Plotnikov, Georgina Quint, MD  triamcinolone cream (KENALOG) 0.1 % Apply 1 Application topically 2 (two) times daily as needed. Patient taking differently: Apply 1 Application topically 2 (two) times daily as needed (insect bites). 09/08/22  Yes Plotnikov, Georgina Quint, MD  cephALEXin (KEFLEX) 500 MG capsule Take 1 capsule (500 mg total) by mouth 2 (two) times daily. Use prn bladder infection Patient not taking: Reported on 08/04/2023 06/19/23   Plotnikov, Georgina Quint, MD  Cholecalciferol (VITAMIN D3) 50 MCG (2000 UT) capsule Take 1 capsule (2,000 Units total) by mouth daily. Patient not taking: Reported on 08/04/2023  10/01/19   Plotnikov, Georgina Quint, MD  Cyanocobalamin (VITAMIN B-12) 1000 MCG SUBL Place 1 tablet (1,000 mcg total) under the tongue daily. Patient not taking: Reported on 08/04/2023 10/01/19   Plotnikov, Georgina Quint, MD  MegaRed Omega-3 Krill Oil 500 MG CAPS Take 1 capsule by mouth every morning. Patient not taking: Reported on 08/04/2023 04/13/21   Plotnikov, Georgina Quint, MD      Allergies    Erythromycin, Erythromycin base, and Hydrochlorothiazide    Review of Systems   Review of Systems Review of systems completed and notable as per HPI.  ROS otherwise negative.   Physical Exam Updated Vital Signs BP (!) 159/80   Pulse 80   Temp 98.3 F (36.8 C) (Oral)   Resp 18   SpO2 93%  Physical Exam Vitals and nursing note reviewed.  Constitutional:      General: She is in acute distress.     Appearance: She is well-developed.  HENT:     Head: Normocephalic and atraumatic.  Eyes:     Conjunctiva/sclera: Conjunctivae normal.  Cardiovascular:     Rate and Rhythm: Normal rate and regular rhythm.     Pulses: Normal pulses.     Heart sounds: Normal heart sounds. No murmur heard. Pulmonary:     Effort: Pulmonary effort is normal. No respiratory distress.     Breath sounds: Normal breath sounds.  Abdominal:     Palpations: Abdomen is soft.     Tenderness: There is no abdominal tenderness.  Musculoskeletal:  General: No swelling.     Cervical back: Normal range of motion and neck supple. No rigidity or tenderness.     Right lower leg: No edema.     Left lower leg: No edema.  Skin:    General: Skin is warm and dry.     Capillary Refill: Capillary refill takes less than 2 seconds.  Neurological:     Mental Status: She is alert.     Comments: Patient is awake and alert.  Her extra movements are intact but she has bilateral nystagmus.  No vertical nystagmus.  Cranial nerves are otherwise intact.  She has full strength and normal sensation in bilateral upper and lower extremities.  Her  finger-to-nose is okay but when she tries to stand she is very ataxic.  She has no visual field deficits.  Speech is normal.  For his exam, she has no corrective saccade with head impulse and bilateral nystagmus.  No skew.  Psychiatric:        Mood and Affect: Mood normal.     ED Results / Procedures / Treatments   Labs (all labs ordered are listed, but only abnormal results are displayed) Labs Reviewed  COMPREHENSIVE METABOLIC PANEL - Abnormal; Notable for the following components:      Result Value   Sodium 134 (*)    Glucose, Bld 166 (*)    AST 49 (*)    ALT 61 (*)    All other components within normal limits  URINALYSIS, ROUTINE W REFLEX MICROSCOPIC - Abnormal; Notable for the following components:   Color, Urine STRAW (*)    All other components within normal limits  CBC WITH DIFFERENTIAL/PLATELET - Abnormal; Notable for the following components:   RBC 5.34 (*)    Hemoglobin 15.5 (*)    All other components within normal limits  CBC - Abnormal; Notable for the following components:   RBC 5.44 (*)    Hemoglobin 15.6 (*)    All other components within normal limits  I-STAT CHEM 8, ED - Abnormal; Notable for the following components:   Glucose, Bld 168 (*)    Hemoglobin 16.0 (*)    HCT 47.0 (*)    All other components within normal limits  CBG MONITORING, ED - Abnormal; Notable for the following components:   Glucose-Capillary 144 (*)    All other components within normal limits  ETHANOL  PROTIME-INR  APTT  RAPID URINE DRUG SCREEN, HOSP PERFORMED  CREATININE, SERUM  CBC  DIFFERENTIAL  LIPID PANEL  HEMOGLOBIN A1C  LIPID PANEL  PROTIME-INR  APTT  TROPONIN I (HIGH SENSITIVITY)  TROPONIN I (HIGH SENSITIVITY)    EKG EKG Interpretation Date/Time:  Friday August 04 2023 16:56:15 EST Ventricular Rate:  89 PR Interval:  172 QRS Duration:  96 QT Interval:  379 QTC Calculation: 462 R Axis:   -63  Text Interpretation: Sinus rhythm Left anterior fascicular block  Abnormal R-wave progression, late transition No significant change since last tracing Confirmed by Fulton Reek [phone]) on 08/04/2023 5:05:01 PM  Radiology DG CHEST PORT 1 VIEW  Result Date: 08/04/2023 CLINICAL DATA:  Chest pain, dizziness, and nausea EXAM: PORTABLE CHEST 1 VIEW COMPARISON:  None Available. FINDINGS: Single frontal view of the chest demonstrates an unremarkable cardiac silhouette. Lung volumes are diminished, with crowding the central vasculature. No acute airspace disease, effusion, or pneumothorax. No acute bony abnormality. IMPRESSION: 1. Low lung volumes.  No acute airspace disease. Electronically Signed   By: Sharlet Salina M.D.   On: 08/04/2023  19:23   CT Angio Head Neck W WO CM  Result Date: 08/04/2023 CLINICAL DATA:  Neuro deficit, stroke suspected. Dizziness. Patient feels like rim is pending. Nausea. EXAM: CT ANGIOGRAPHY HEAD AND NECK WITH AND WITHOUT CONTRAST TECHNIQUE: Multidetector CT imaging of the head and neck was performed using the standard protocol during bolus administration of intravenous contrast. Multiplanar CT image reconstructions and MIPs were obtained to evaluate the vascular anatomy. Carotid stenosis measurements (when applicable) are obtained utilizing NASCET criteria, using the distal internal carotid diameter as the denominator. RADIATION DOSE REDUCTION: This exam was performed according to the departmental dose-optimization program which includes automated exposure control, adjustment of the mA and/or kV according to patient size and/or use of iterative reconstruction technique. CONTRAST:  80mL OMNIPAQUE IOHEXOL 350 MG/ML SOLN COMPARISON:  None Available. FINDINGS: CTA NECK FINDINGS Aortic arch: Atherosclerotic calcifications are present in the distal aortic arch beyond the great vessel origins. No aneurysm or stenosis is present. Right carotid system: The right common carotid artery is within normal limits. Atherosclerotic changes are present at the  proximal right ICA without significant stenosis relative to the more distal vessel. Cervical right ICA is otherwise normal. Left carotid system: The left common carotid artery is within normal limits. Irregular noncalcified plaque is present proximal left ICA without a significant stenosis relative to the more distal vessel. The more distal cervical left ICA is normal. Vertebral arteries: The vertebral arteries are codominant. Both vertebral arteries originate from the subclavian arteries. No significant stenosis is present in either vertebral artery in the neck. The vertebral body heights and alignment are normal. Skeleton: Vertebral body heights and alignment are normal. No focal osseous lesions are present. Other neck: Soft tissues the neck are otherwise unremarkable. Salivary glands are within normal limits. Thyroid is normal. No significant adenopathy is present. No focal mucosal or submucosal lesions are present. Upper chest: A subpleural lipoma is noted at the right lung apex. Review of the MIP images confirms the above findings CTA HEAD FINDINGS Anterior circulation: The internal carotid arteries are within normal limits from the skull base to the ICA termini. The A1 and M1 segments are normal. MCA bifurcations are within normal limits. The ACA and MCA branch vessels are normal. No aneurysm is present. Posterior circulation: The vertebral arteries are codominant. Vertebrobasilar junction and basilar artery normal. The superior cerebellar arteries are patent bilaterally. The right posterior cerebral artery originates from basilar tip. The left posterior cerebral artery is of fetal type. PCA branch vessels are within normal limits bilaterally. No aneurysm is present. Venous sinuses: The dural sinuses are patent. The straight sinus and deep cerebral veins are intact. Cortical veins are within normal limits. No significant vascular malformation is evident. Anatomic variants: Fetal type left posterior cerebral  artery. Review of the MIP images confirms the above findings IMPRESSION: 1. No emergent large vessel occlusion. 2. Atherosclerotic changes at the proximal internal carotid arteries bilaterally without significant stenosis relative to the more distal vessels. 3. Normal variant CTA Circle of Willis without significant proximal stenosis, aneurysm, or branch vessel occlusion. 4.  Aortic Atherosclerosis (ICD10-I70.0). Electronically Signed   By: Marin Roberts M.D.   On: 08/04/2023 19:18   CT HEAD CODE STROKE WO CONTRAST  Result Date: 08/04/2023 CLINICAL DATA:  Code stroke. Neuro deficit, acute, stroke suspected. Patient feels the room is spinning. She is nauseated. EXAM: CT HEAD WITHOUT CONTRAST TECHNIQUE: Contiguous axial images were obtained from the base of the skull through the vertex without intravenous contrast. RADIATION DOSE REDUCTION:  This exam was performed according to the departmental dose-optimization program which includes automated exposure control, adjustment of the mA and/or kV according to patient size and/or use of iterative reconstruction technique. COMPARISON:  None Available. FINDINGS: Brain: No acute infarct, hemorrhage, or mass lesion is present. No significant white matter lesions are present. Deep brain nuclei are within normal limits. The ventricles are of normal size. No significant extraaxial fluid collection is present. The brainstem and cerebellum are within normal limits. Midline structures are within normal limits. Vascular: No hyperdense vessel or unexpected calcification. Skull: No significant extracranial soft tissue lesion is present. Calvarium is intact. No focal lytic or blastic lesions are present. Hyperostosis is noted. Sinuses/Orbits: The paranasal sinuses and mastoid air cells are clear. The globes and orbits are within normal limits. ASPECTS [facility] Stroke Program Early CT Score) - Ganglionic level infarction (caudate, lentiform nuclei, internal capsule, insula,  M1-M3 cortex): 7/7 - Supraganglionic infarction (M4-M6 cortex): 3/3 Total score (0-10 with 10 being normal): 10/10 IMPRESSION: 1. Normal head CT. 2. Aspects is 10/10. The above was relayed via text pager to Dr. Roda Shutters on 08/04/2023 at 16:50 . Electronically Signed   By: Marin Roberts M.D.   On: 08/04/2023 16:52    Procedures Procedures    Medications Ordered in ED Medications   stroke: early stages of recovery book (has no administration in time range)   stroke: early stages of recovery book (has no administration in time range)  acetaminophen (TYLENOL) tablet 650 mg (has no administration in time range)    Or  acetaminophen (TYLENOL) 160 MG/5ML solution 650 mg (has no administration in time range)    Or  acetaminophen (TYLENOL) suppository 650 mg (has no administration in time range)  senna-docusate (Senokot-S) tablet 1 tablet (has no administration in time range)  enoxaparin (LOVENOX) injection 40 mg (has no administration in time range)  meclizine (ANTIVERT) tablet 25 mg (25 mg Oral Given 08/04/23 2137)  famotidine (PEPCID) tablet 40 mg (40 mg Oral Given 08/04/23 2020)  ondansetron (ZOFRAN) injection 4 mg (4 mg Intravenous Given 08/04/23 1709)  lactated ringers bolus 1,000 mL (0 mLs Intravenous Stopped 08/04/23 1920)  iohexol (OMNIPAQUE) 350 MG/ML injection 80 mL (80 mLs Intravenous Contrast Given 08/04/23 1824)    ED Course/ Medical Decision Making/ A&P Clinical Course as of 08/04/23 2339  Fri Aug 04, 2023  1850 Discussed with hospitalist accepts for admission. [JD]    Clinical Course User Index [JD] Laurence Spates, MD                                 Medical Decision Making Amount and/or Complexity of Data Reviewed Labs: ordered. Radiology: ordered.  Risk Prescription drug management. Decision regarding hospitalization.   Medical Decision Making:   Kayla Aguirre is a 72 y.o. female who presented to the ED today with acute onset dizziness.  On my exam shows  bilateral nystagmus and some ataxia with standing.  Her hands exam is concerning for possible central cause given dose of corrective saccade as well as bilateral nystagmus.  Given she is only 2 hours post onset of symptoms she is presenting a stroke window and code stroke is been activated.  She does note some mild chest pain which is nonspecific.  Will obtain EKG and troponin for evaluation of the symptoms and predominantly related to her dizziness.   Patient placed on continuous vitals and telemetry monitoring while in ED which was  reviewed periodically.  Reviewed and confirmed nursing documentation for past medical history, family history, social history.  Reassessment and Plan:   EKG unchanged from prior, troponin normal low suspicion for ACS.  On reevaluation is feeling somewhat better after Zofran.  She was evaluated by Dr. Roda Shutters with neurology.  They do not think TNK is indicated but recommended admission for CT angiogram as well as MRI and stroke workup.  They prefer patient to be admitted at [facility].  Discussed with patient who was agreeable.  CT head reviewed which is unremarkable without acute findings.  Discussed with hospitalist and admitted.   Patient's presentation is most consistent with acute complicated illness / injury requiring diagnostic workup.           Final Clinical Impression(s) / ED Diagnoses Final diagnoses:  Vertigo    Rx / DC Orders ED Discharge Orders     None         Laurence Spates, MD 08/04/23 2339

## 2023-08-05 ENCOUNTER — Observation Stay (HOSPITAL_BASED_OUTPATIENT_CLINIC_OR_DEPARTMENT_OTHER): Payer: Medicare HMO

## 2023-08-05 ENCOUNTER — Other Ambulatory Visit (HOSPITAL_COMMUNITY): Payer: Self-pay

## 2023-08-05 DIAGNOSIS — R42 Dizziness and giddiness: Secondary | ICD-10-CM | POA: Diagnosis not present

## 2023-08-05 DIAGNOSIS — G459 Transient cerebral ischemic attack, unspecified: Secondary | ICD-10-CM

## 2023-08-05 LAB — LIPID PANEL
Cholesterol: 268 mg/dL — ABNORMAL HIGH (ref 0–200)
HDL: 56 mg/dL (ref >40)
LDL Cholesterol: 183 mg/dL — ABNORMAL HIGH (ref 0–99)
Total CHOL/HDL Ratio: 4.8 {ratio}
Triglycerides: 144 mg/dL (ref <150)
VLDL: 29 mg/dL (ref 0–40)

## 2023-08-05 LAB — ECHOCARDIOGRAM COMPLETE
AR max vel: 2.83 cm2
AV Area VTI: 2.87 cm2
AV Area mean vel: 2.74 cm2
AV Mean grad: 4 mm[Hg]
AV Peak grad: 8 mm[Hg]
Ao pk vel: 1.41 m/s
Area-P 1/2: 3.77 cm2
S' Lateral: 2.2 cm

## 2023-08-05 LAB — HEMOGLOBIN A1C
Hgb A1c MFr Bld: 6.4 % — ABNORMAL HIGH (ref 4.8–5.6)
Mean Plasma Glucose: 136.98 mg/dL

## 2023-08-05 LAB — PROTIME-INR
INR: 1 (ref 0.8–1.2)
Prothrombin Time: 13.4 s (ref 11.4–15.2)

## 2023-08-05 LAB — APTT: aPTT: 26 s (ref 24–36)

## 2023-08-05 MED ORDER — AMLODIPINE BESYLATE 5 MG PO TABS
5.0000 mg | ORAL_TABLET | Freq: Every day | ORAL | Status: DC
  Administered 2023-08-05: 5 mg via ORAL
  Filled 2023-08-05: qty 1

## 2023-08-05 MED ORDER — ASPIRIN 81 MG PO TBEC
81.0000 mg | DELAYED_RELEASE_TABLET | Freq: Every day | ORAL | 0 refills | Status: AC
  Filled 2023-08-05: qty 120, 120d supply, fill #0

## 2023-08-05 MED ORDER — ATORVASTATIN CALCIUM 40 MG PO TABS
80.0000 mg | ORAL_TABLET | Freq: Every day | ORAL | Status: DC
  Administered 2023-08-05: 80 mg via ORAL
  Filled 2023-08-05: qty 2

## 2023-08-05 MED ORDER — MECLIZINE HCL 25 MG PO TABS
25.0000 mg | ORAL_TABLET | Freq: Three times a day (TID) | ORAL | 0 refills | Status: AC | PRN
  Filled 2023-08-05: qty 30, 10d supply, fill #0

## 2023-08-05 MED ORDER — ATORVASTATIN CALCIUM 80 MG PO TABS
80.0000 mg | ORAL_TABLET | Freq: Every day | ORAL | 0 refills | Status: DC
  Filled 2023-08-05: qty 30, 30d supply, fill #0

## 2023-08-05 MED ORDER — MECLIZINE HCL 25 MG PO TABS: 25.0000 mg | ORAL_TABLET | Freq: Three times a day (TID) | ORAL | 0 refills | Status: DC | PRN

## 2023-08-05 MED ORDER — ATORVASTATIN CALCIUM 80 MG PO TABS: 80.0000 mg | ORAL_TABLET | Freq: Every day | ORAL | 0 refills | Status: DC

## 2023-08-05 NOTE — Progress Notes (Signed)
  Echocardiogram 2D Echocardiogram has been performed.  Reinaldo Raddle Jasaiah Karwowski 08/05/2023, 11:58 AM

## 2023-08-05 NOTE — ED Notes (Signed)
ED TO INPATIENT HANDOFF REPORT  Name/Age/Gender Kayla Aguirre 72 y.o. female  Code Status    Code Status Orders  (From admission, onward)           Start     Ordered   08/04/23 1959  Full code  Continuous       Question:  By:  Answer:  Consent: discussion documented in EHR   08/04/23 1959           Code Status History     This patient has a current code status but no historical code status.       Home/SNF/Other Home  Chief Complaint Vertigo [R42]  Level of Care/Admitting Diagnosis ED Disposition     ED Disposition  Admit   Condition  --   Comment  Hospital Area: [facility] [[id number]]  Level of Care: Telemetry [5]  Admit to tele based on following criteria: Eval of Syncope  May place patient in observation at [facility] or [facility] if equivalent level of care is available:: No  Covid Evaluation: Asymptomatic - no recent exposure (last 10 days) testing not required  Diagnosis: Vertigo [207257]  Admitting Physician: Nolberto Hanlon [[id number]]  Attending Physician: Rolly Salter [[id number]]          Medical History Past Medical History:  Diagnosis Date   Hypertension     Allergies Allergies  Allergen Reactions   Erythromycin    Erythromycin Base    Hydrochlorothiazide     Cramps on 25 mg, ok on 12.5 mg    IV Location/Drains/Wounds Patient Lines/Drains/Airways Status     Active Line/Drains/Airways     Name Placement date Placement time Site Days   Peripheral IV 02/04/22 20 G Left Antecubital 02/04/22  0559  Antecubital  547   Peripheral IV 02/04/22 18 G 1.25" Right Antecubital 02/04/22  0604  Antecubital  547   Peripheral IV 08/04/23 20 G Anterior;Left;Proximal Forearm 08/04/23  1706  Forearm  1            Labs/Imaging Results for orders placed or performed during the hospital encounter of 08/04/23 (from the past 48 hour(s))  POC CBG, ED     Status: Abnormal   Collection Time: 08/04/23  4:51 PM  Result  Value Ref Range   Glucose-Capillary 144 (H) 70 - 99 mg/dL    Comment: Glucose reference range applies only to samples taken after fasting for at least 8 hours.  I-stat chem 8, ED     Status: Abnormal   Collection Time: 08/04/23  5:12 PM  Result Value Ref Range   Sodium 137 135 - 145 mmol/L   Potassium 3.7 3.5 - 5.1 mmol/L   Chloride 104 98 - 111 mmol/L   BUN 16 8 - 23 mg/dL   Creatinine, Ser 2.84 0.44 - 1.00 mg/dL   Glucose, Bld 132 (H) 70 - 99 mg/dL    Comment: Glucose reference range applies only to samples taken after fasting for at least 8 hours.   Calcium, Ion 1.15 1.15 - 1.40 mmol/L   TCO2 24 22 - 32 mmol/L   Hemoglobin 16.0 (H) 12.0 - 15.0 g/dL   HCT 44.0 (H) 10.2 - 72.5 %  Ethanol     Status: None   Collection Time: 08/04/23  5:12 PM  Result Value Ref Range   Alcohol, Ethyl (B) <10 <10 mg/dL    Comment: (NOTE) Lowest detectable limit for serum alcohol is 10 mg/dL.  For medical purposes only. Performed at  [facility], [address], [place], [place] [address]   Protime-INR     Status: None   Collection Time: 08/04/23  5:16 PM  Result Value Ref Range   Prothrombin Time 12.8 11.4 - 15.2 seconds   INR 0.9 0.8 - 1.2    Comment: (NOTE) INR goal varies based on device and disease states. Performed at [facility], [address], [place], [place] [address]   APTT     Status: None   Collection Time: 08/04/23  5:16 PM  Result Value Ref Range   aPTT 30 24 - 36 seconds    Comment: Performed at [facility], [address] [place], [place] [address]  Comprehensive metabolic panel     Status: Abnormal   Collection Time: 08/04/23  5:16 PM  Result Value Ref Range   Sodium 134 (L) 135 - 145 mmol/L   Potassium 3.5 3.5 - 5.1 mmol/L   Chloride 100 98 - 111 mmol/L   CO2 23 22 - 32 mmol/L   Glucose, Bld 166 (H) 70 - 99 mg/dL    Comment: Glucose reference range applies only to samples taken after fasting for at least 8  hours.   BUN 16 8 - 23 mg/dL   Creatinine, Ser 2.13 0.44 - 1.00 mg/dL   Calcium 9.0 8.9 - 08.6 mg/dL   Total Protein 7.5 6.5 - 8.1 g/dL   Albumin 4.2 3.5 - 5.0 g/dL   AST 49 (H) 15 - 41 U/L   ALT 61 (H) 0 - 44 U/L   Alkaline Phosphatase 86 38 - 126 U/L   Total Bilirubin 1.1 <1.2 mg/dL   GFR, Estimated >57 >84 mL/min    Comment: (NOTE) Calculated using the CKD-EPI Creatinine Equation (2021)    Anion gap 11 5 - 15    Comment: Performed at [facility], [address], [place], [place] [address]  CBC WITH DIFFERENTIAL     Status: Abnormal   Collection Time: 08/04/23  5:16 PM  Result Value Ref Range   WBC 8.5 4.0 - 10.5 K/uL   RBC 5.34 (H) 3.87 - 5.11 MIL/uL   Hemoglobin 15.5 (H) 12.0 - 15.0 g/dL   HCT 52.8 41.3 - 24.4 %   MCV 84.3 80.0 - 100.0 fL   MCH 29.0 26.0 - 34.0 pg   MCHC 34.4 30.0 - 36.0 g/dL   RDW 01.0 27.2 - 53.6 %   Platelets 227 150 - 400 K/uL   nRBC 0.0 0.0 - 0.2 %   Neutrophils Relative % 75 %   Neutro Abs 6.4 1.7 - 7.7 K/uL   Lymphocytes Relative 18 %   Lymphs Abs 1.5 0.7 - 4.0 K/uL   Monocytes Relative 4 %   Monocytes Absolute 0.3 0.1 - 1.0 K/uL   Eosinophils Relative 2 %   Eosinophils Absolute 0.1 0.0 - 0.5 K/uL   Basophils Relative 1 %   Basophils Absolute 0.1 0.0 - 0.1 K/uL   Immature Granulocytes 0 %   Abs Immature Granulocytes 0.03 0.00 - 0.07 K/uL    Comment: Performed at [facility], [address] [place], [place] [address]  Troponin I (High Sensitivity)     Status: None   Collection Time: 08/04/23  5:16 PM  Result Value Ref Range   Troponin I (High Sensitivity) 3 <18 ng/L    Comment: (NOTE) Elevated high sensitivity troponin I (hsTnI) values and significant  changes across serial measurements may suggest ACS but  many other  chronic and acute conditions are known to elevate hsTnI results.  Refer to the "Links" section for chest pain algorithms and additional  guidance. Performed at [facility], [address], [place], [place] [address]   Urine rapid drug screen (hosp performed)     Status: None   Collection Time: 08/04/23  7:22 PM  Result Value Ref Range   Opiates NONE DETECTED NONE DETECTED   Cocaine NONE DETECTED NONE DETECTED   Benzodiazepines NONE DETECTED NONE DETECTED   Amphetamines NONE DETECTED NONE DETECTED   Tetrahydrocannabinol NONE DETECTED NONE DETECTED   Barbiturates NONE DETECTED NONE DETECTED    Comment: (NOTE) DRUG SCREEN FOR MEDICAL PURPOSES ONLY.  IF CONFIRMATION IS NEEDED FOR ANY PURPOSE, NOTIFY LAB WITHIN 5 DAYS.  LOWEST DETECTABLE LIMITS FOR URINE DRUG SCREEN Drug Class                     Cutoff (ng/mL) Amphetamine and metabolites    1000 Barbiturate and metabolites    200 Benzodiazepine                 200 Opiates and metabolites        300 Cocaine and metabolites        300 THC                            50 Performed at [facility], [address] [place], [place] [address]   Urinalysis, Routine w reflex microscopic -Urine, Clean Catch     Status: Abnormal   Collection Time: 08/04/23  7:22 PM  Result Value Ref Range   Color, Urine STRAW (A) YELLOW   APPearance CLEAR CLEAR   Specific Gravity, Urine 1.016 1.005 - 1.030   pH 6.0 5.0 - 8.0   Glucose, UA NEGATIVE NEGATIVE mg/dL   Hgb urine dipstick NEGATIVE NEGATIVE   Bilirubin Urine NEGATIVE NEGATIVE   Ketones, ur NEGATIVE NEGATIVE mg/dL   Protein, ur NEGATIVE NEGATIVE mg/dL   Nitrite NEGATIVE NEGATIVE   Leukocytes,Ua NEGATIVE NEGATIVE    Comment: Performed at [facility], [address] [place], [place] [address]  Troponin I (High Sensitivity)     Status: None   Collection Time: 08/04/23  8:24 PM  Result Value Ref Range   Troponin I (High Sensitivity) 3 <18 ng/L    Comment: (NOTE) Elevated high sensitivity troponin I (hsTnI) values and significant  changes across serial measurements may suggest ACS but many other  chronic and  acute conditions are known to elevate hsTnI results.  Refer to the "Links" section for chest pain algorithms and additional  guidance. Performed at [facility], [address] [place], [place] [address]   CBC     Status: Abnormal   Collection Time: 08/04/23  8:24 PM  Result Value Ref Range   WBC 9.7 4.0 - 10.5 K/uL   RBC 5.44 (H) 3.87 - 5.11 MIL/uL   Hemoglobin 15.6 (H) 12.0 - 15.0 g/dL   HCT 29.5 62.1 - 30.8 %   MCV 83.5 80.0 - 100.0 fL   MCH 28.7 26.0 - 34.0 pg   MCHC 34.4 30.0 - 36.0 g/dL   RDW 65.7 84.6 - 96.2 %   Platelets 224 150 - 400 K/uL   nRBC 0.0 0.0 - 0.2 %    Comment: Performed at [facility], [address] [place], [place] [address]  Creatinine,  serum     Status: None   Collection Time: 08/04/23  8:24 PM  Result Value Ref Range   Creatinine, Ser 0.73 0.44 - 1.00 mg/dL   GFR, Estimated >13 >24 mL/min    Comment: (NOTE) Calculated using the CKD-EPI Creatinine Equation (2021) Performed at [facility], [address], [place], [place] [address]   Lipid panel     Status: Abnormal   Collection Time: 08/05/23  4:36 AM  Result Value Ref Range   Cholesterol 268 (H) 0 - 200 mg/dL   Triglycerides 725 <366 mg/dL   HDL 56 >44 mg/dL   Total CHOL/HDL Ratio 4.8 RATIO   VLDL 29 0 - 40 mg/dL   LDL Cholesterol 034 (H) 0 - 99 mg/dL    Comment:        Total Cholesterol/HDL:CHD Risk Coronary Heart Disease Risk Table                     Men   Women  1/2 Average Risk   3.4   3.3  Average Risk       5.0   4.4  2 X Average Risk   9.6   7.1  3 X Average Risk  23.4   11.0        Use the calculated Patient Ratio above and the CHD Risk Table to determine the patient's CHD Risk.        ATP III CLASSIFICATION (LDL):  <100     mg/dL   Optimal  742-595  mg/dL   Near or Above                    Optimal  130-159  mg/dL   Borderline  638-756  mg/dL   High  >433     mg/dL   Very High Performed at [facility], [address], [place], [place] [address]   Protime-INR     Status: None   Collection Time: 08/05/23  4:36 AM  Result Value Ref Range   Prothrombin Time 13.4 11.4 - 15.2 seconds   INR 1.0 0.8 - 1.2    Comment: (NOTE) INR goal varies based on device and disease states. Performed at [facility], [address], [place], [place] [address]   APTT     Status: None   Collection Time: 08/05/23  4:36 AM  Result Value Ref Range   aPTT 26 24 - 36 seconds    Comment: Performed at [facility], [address], [place], [place] [address]   MR BRAIN WO CONTRAST  Result Date: 08/05/2023 CLINICAL DATA:  Follow-up examination for stroke. EXAM: MRI HEAD WITHOUT CONTRAST TECHNIQUE: Multiplanar, multiecho pulse sequences of the brain and surrounding structures were obtained without intravenous contrast. COMPARISON:  Prior CT from earlier the same day. FINDINGS: Brain: Cerebral volume within normal limits. Patchy T2/FLAIR hyperintensity involving the periventricular, deep, and subcortical white matter both cerebral hemispheres, most like related chronic microvascular ischemic disease, mild for age. No evidence for acute or subacute ischemia. Gray-white matter differentiation maintained. No acute or chronic intracranial blood products. No mass lesion, midline shift or mass effect. No hydrocephalus or extra-axial fluid collection. Pituitary gland and suprasellar region within normal limits. Vascular: Major intracranial vascular flow voids are maintained. Skull and upper cervical spine: Craniocervical junctional limits. Bone marrow signal intensity normal. No scalp soft tissue abnormality. Sinuses/Orbits: Globes and orbital soft tissues within normal limits. Mild mucosal thickening present about  the ethmoidal air cells. Paranasal sinuses are otherwise clear. No mastoid effusion. Other: None. IMPRESSION: 1. No acute intracranial abnormality. 2. Mild chronic  microvascular ischemic disease for age. Electronically Signed   By: Rise Mu M.D.   On: 08/05/2023 01:11   DG CHEST PORT 1 VIEW  Result Date: 08/04/2023 CLINICAL DATA:  Chest pain, dizziness, and nausea EXAM: PORTABLE CHEST 1 VIEW COMPARISON:  None Available. FINDINGS: Single frontal view of the chest demonstrates an unremarkable cardiac silhouette. Lung volumes are diminished, with crowding the central vasculature. No acute airspace disease, effusion, or pneumothorax. No acute bony abnormality. IMPRESSION: 1. Low lung volumes.  No acute airspace disease. Electronically Signed   By: Sharlet Salina M.D.   On: 08/04/2023 19:23   CT Angio Head Neck W WO CM  Result Date: 08/04/2023 CLINICAL DATA:  Neuro deficit, stroke suspected. Dizziness. Patient feels like rim is pending. Nausea. EXAM: CT ANGIOGRAPHY HEAD AND NECK WITH AND WITHOUT CONTRAST TECHNIQUE: Multidetector CT imaging of the head and neck was performed using the standard protocol during bolus administration of intravenous contrast. Multiplanar CT image reconstructions and MIPs were obtained to evaluate the vascular anatomy. Carotid stenosis measurements (when applicable) are obtained utilizing NASCET criteria, using the distal internal carotid diameter as the denominator. RADIATION DOSE REDUCTION: This exam was performed according to the departmental dose-optimization program which includes automated exposure control, adjustment of the mA and/or kV according to patient size and/or use of iterative reconstruction technique. CONTRAST:  80mL OMNIPAQUE IOHEXOL 350 MG/ML SOLN COMPARISON:  None Available. FINDINGS: CTA NECK FINDINGS Aortic arch: Atherosclerotic calcifications are present in the distal aortic arch beyond the great vessel origins. No aneurysm or stenosis is present. Right carotid system: The right common carotid artery is within normal limits. Atherosclerotic changes are present at the proximal right ICA without significant  stenosis relative to the more distal vessel. Cervical right ICA is otherwise normal. Left carotid system: The left common carotid artery is within normal limits. Irregular noncalcified plaque is present proximal left ICA without a significant stenosis relative to the more distal vessel. The more distal cervical left ICA is normal. Vertebral arteries: The vertebral arteries are codominant. Both vertebral arteries originate from the subclavian arteries. No significant stenosis is present in either vertebral artery in the neck. The vertebral body heights and alignment are normal. Skeleton: Vertebral body heights and alignment are normal. No focal osseous lesions are present. Other neck: Soft tissues the neck are otherwise unremarkable. Salivary glands are within normal limits. Thyroid is normal. No significant adenopathy is present. No focal mucosal or submucosal lesions are present. Upper chest: A subpleural lipoma is noted at the right lung apex. Review of the MIP images confirms the above findings CTA HEAD FINDINGS Anterior circulation: The internal carotid arteries are within normal limits from the skull base to the ICA termini. The A1 and M1 segments are normal. MCA bifurcations are within normal limits. The ACA and MCA branch vessels are normal. No aneurysm is present. Posterior circulation: The vertebral arteries are codominant. Vertebrobasilar junction and basilar artery normal. The superior cerebellar arteries are patent bilaterally. The right posterior cerebral artery originates from basilar tip. The left posterior cerebral artery is of fetal type. PCA branch vessels are within normal limits bilaterally. No aneurysm is present. Venous sinuses: The dural sinuses are patent. The straight sinus and deep cerebral veins are intact. Cortical veins are within normal limits. No significant vascular malformation is evident. Anatomic variants: Fetal type left posterior cerebral artery. Review of  the MIP images confirms  the above findings IMPRESSION: 1. No emergent large vessel occlusion. 2. Atherosclerotic changes at the proximal internal carotid arteries bilaterally without significant stenosis relative to the more distal vessels. 3. Normal variant CTA Circle of Willis without significant proximal stenosis, aneurysm, or branch vessel occlusion. 4.  Aortic Atherosclerosis (ICD10-I70.0). Electronically Signed   By: Marin Roberts M.D.   On: 08/04/2023 19:18   CT HEAD CODE STROKE WO CONTRAST  Result Date: 08/04/2023 CLINICAL DATA:  Code stroke. Neuro deficit, acute, stroke suspected. Patient feels the room is spinning. She is nauseated. EXAM: CT HEAD WITHOUT CONTRAST TECHNIQUE: Contiguous axial images were obtained from the base of the skull through the vertex without intravenous contrast. RADIATION DOSE REDUCTION: This exam was performed according to the departmental dose-optimization program which includes automated exposure control, adjustment of the mA and/or kV according to patient size and/or use of iterative reconstruction technique. COMPARISON:  None Available. FINDINGS: Brain: No acute infarct, hemorrhage, or mass lesion is present. No significant white matter lesions are present. Deep brain nuclei are within normal limits. The ventricles are of normal size. No significant extraaxial fluid collection is present. The brainstem and cerebellum are within normal limits. Midline structures are within normal limits. Vascular: No hyperdense vessel or unexpected calcification. Skull: No significant extracranial soft tissue lesion is present. Calvarium is intact. No focal lytic or blastic lesions are present. Hyperostosis is noted. Sinuses/Orbits: The paranasal sinuses and mastoid air cells are clear. The globes and orbits are within normal limits. ASPECTS [facility] Stroke Program Early CT Score) - Ganglionic level infarction (caudate, lentiform nuclei, internal capsule, insula, M1-M3 cortex): 7/7 - Supraganglionic  infarction (M4-M6 cortex): 3/3 Total score (0-10 with 10 being normal): 10/10 IMPRESSION: 1. Normal head CT. 2. Aspects is 10/10. The above was relayed via text pager to Dr. Roda Shutters on 08/04/2023 at 16:50 . Electronically Signed   By: Marin Roberts M.D.   On: 08/04/2023 16:52    Pending Labs Unresulted Labs (From admission, onward)     Start     Ordered   08/11/23 0500  Creatinine, serum  (enoxaparin (LOVENOX)    CrCl >/= 30 ml/min)  Weekly,   R     Comments: while on enoxaparin therapy    08/04/23 1959   08/05/23 0500  Hemoglobin A1c  (Labs)  Tomorrow morning,   R       Comments: To assess prior glycemic control    08/04/23 1821   08/04/23 1625  CBC  (Stroke Panel (PNL))  ONCE - STAT,   STAT        08/04/23 1625   08/04/23 1625  Differential  (Stroke Panel (PNL))  ONCE - STAT,   URGENT        08/04/23 1625            Vitals/Pain Today's Vitals   08/05/23 0942 08/05/23 1000 08/05/23 1030 08/05/23 1100  BP: (!) 139/90 136/69 125/68 115/78  Pulse:  84 73 65  Resp:  13 15 15   Temp:      TempSrc:      SpO2:  94% (!) 89% 93%  PainSc:        Isolation Precautions No active isolations  Medications Medications   stroke: early stages of recovery book ( Does not apply Not Given 08/05/23 0927)   stroke: early stages of recovery book ( Does not apply Not Given 08/05/23 0926)  acetaminophen (TYLENOL) tablet 650 mg (has no administration in time range)  Or  acetaminophen (TYLENOL) 160 MG/5ML solution 650 mg (has no administration in time range)    Or  acetaminophen (TYLENOL) suppository 650 mg (has no administration in time range)  senna-docusate (Senokot-S) tablet 1 tablet (has no administration in time range)  enoxaparin (LOVENOX) injection 40 mg (40 mg Subcutaneous Given 08/05/23 0942)  meclizine (ANTIVERT) tablet 25 mg (25 mg Oral Given 08/05/23 0941)  famotidine (PEPCID) tablet 40 mg (40 mg Oral Given 08/05/23 0941)  amLODipine (NORVASC) tablet 5 mg (5 mg Oral Given  08/05/23 0942)  atorvastatin (LIPITOR) tablet 80 mg (80 mg Oral Given 08/05/23 0941)  ondansetron (ZOFRAN) injection 4 mg (4 mg Intravenous Given 08/04/23 1709)  lactated ringers bolus 1,000 mL (0 mLs Intravenous Stopped 08/04/23 1920)  iohexol (OMNIPAQUE) 350 MG/ML injection 80 mL (80 mLs Intravenous Contrast Given 08/04/23 1824)    Mobility walks

## 2023-08-05 NOTE — Plan of Care (Addendum)
STROKE TEAM PROGRESS NOTE   BRIEF HPI Kayla Aguirre is a 72 y.o. female with a past medical history of hypertension, hyperlipidemia, hypothyroidism, B12 deficiency presented to ED for code stroke. Per patient around 2 PM, she was on computer, had a sudden onset dizziness, spinning sensation, with nausea but no vomiting. She checked her BP 196/107. She had difficulty walking, her husband has to hold her to walk. She also complaining of some numbness in the lips and the bilateral jaw area. Denies headache but felt heart palpitation. EMS called and she was sent to ER for evaluation.   NIH on Admission 0   SIGNIFICANT HOSPITAL EVENTS: None    INTERIM HISTORY/SUBJECTIVE No acute overnight events noted. Her work up including CTA head and Neck did not show high  grade stenosis and MRI brain was negative for acute stroke. She reports that she is feeling very well today, mildly dizzy but she is able to walk. Her Echocardiogram was also completed and was normal with EF 70 to 75%    OBJECTIVE  CBC    Component Value Date/Time   WBC 9.7 08/04/2023 2024   RBC 5.44 (H) 08/04/2023 2024   HGB 15.6 (H) 08/04/2023 2024   HCT 45.4 08/04/2023 2024   PLT 224 08/04/2023 2024   MCV 83.5 08/04/2023 2024   MCH 28.7 08/04/2023 2024   MCHC 34.4 08/04/2023 2024   RDW 12.5 08/04/2023 2024   LYMPHSABS 1.5 08/04/2023 1716   MONOABS 0.3 08/04/2023 1716   EOSABS 0.1 08/04/2023 1716   BASOSABS 0.1 08/04/2023 1716    BMET    Component Value Date/Time   NA 134 (L) 08/04/2023 1716   K 3.5 08/04/2023 1716   CL 100 08/04/2023 1716   CO2 23 08/04/2023 1716   GLUCOSE 166 (H) 08/04/2023 1716   BUN 16 08/04/2023 1716   CREATININE 0.73 08/04/2023 2024   CALCIUM 9.0 08/04/2023 1716   GFRNONAA >60 08/04/2023 2024    IMAGING past 24 hours MR BRAIN WO CONTRAST  Result Date: 08/05/2023 CLINICAL DATA:  Follow-up examination for stroke. EXAM: MRI HEAD WITHOUT CONTRAST TECHNIQUE: Multiplanar, multiecho pulse  sequences of the brain and surrounding structures were obtained without intravenous contrast. COMPARISON:  Prior CT from earlier the same day. FINDINGS: Brain: Cerebral volume within normal limits. Patchy T2/FLAIR hyperintensity involving the periventricular, deep, and subcortical white matter both cerebral hemispheres, most like related chronic microvascular ischemic disease, mild for age. No evidence for acute or subacute ischemia. Gray-white matter differentiation maintained. No acute or chronic intracranial blood products. No mass lesion, midline shift or mass effect. No hydrocephalus or extra-axial fluid collection. Pituitary gland and suprasellar region within normal limits. Vascular: Major intracranial vascular flow voids are maintained. Skull and upper cervical spine: Craniocervical junctional limits. Bone marrow signal intensity normal. No scalp soft tissue abnormality. Sinuses/Orbits: Globes and orbital soft tissues within normal limits. Mild mucosal thickening present about the ethmoidal air cells. Paranasal sinuses are otherwise clear. No mastoid effusion. Other: None. IMPRESSION: 1. No acute intracranial abnormality. 2. Mild chronic microvascular ischemic disease for age. Electronically Signed   By: Rise Mu M.D.   On: 08/05/2023 01:11   DG CHEST PORT 1 VIEW  Result Date: 08/04/2023 CLINICAL DATA:  Chest pain, dizziness, and nausea EXAM: PORTABLE CHEST 1 VIEW COMPARISON:  None Available. FINDINGS: Single frontal view of the chest demonstrates an unremarkable cardiac silhouette. Lung volumes are diminished, with crowding the central vasculature. No acute airspace disease, effusion, or pneumothorax. No acute bony abnormality.  IMPRESSION: 1. Low lung volumes.  No acute airspace disease. Electronically Signed   By: Sharlet Salina M.D.   On: 08/04/2023 19:23   CT Angio Head Neck W WO CM  Result Date: 08/04/2023 CLINICAL DATA:  Neuro deficit, stroke suspected. Dizziness. Patient feels  like rim is pending. Nausea. EXAM: CT ANGIOGRAPHY HEAD AND NECK WITH AND WITHOUT CONTRAST TECHNIQUE: Multidetector CT imaging of the head and neck was performed using the standard protocol during bolus administration of intravenous contrast. Multiplanar CT image reconstructions and MIPs were obtained to evaluate the vascular anatomy. Carotid stenosis measurements (when applicable) are obtained utilizing NASCET criteria, using the distal internal carotid diameter as the denominator. RADIATION DOSE REDUCTION: This exam was performed according to the departmental dose-optimization program which includes automated exposure control, adjustment of the mA and/or kV according to patient size and/or use of iterative reconstruction technique. CONTRAST:  80mL OMNIPAQUE IOHEXOL 350 MG/ML SOLN COMPARISON:  None Available. FINDINGS: CTA NECK FINDINGS Aortic arch: Atherosclerotic calcifications are present in the distal aortic arch beyond the great vessel origins. No aneurysm or stenosis is present. Right carotid system: The right common carotid artery is within normal limits. Atherosclerotic changes are present at the proximal right ICA without significant stenosis relative to the more distal vessel. Cervical right ICA is otherwise normal. Left carotid system: The left common carotid artery is within normal limits. Irregular noncalcified plaque is present proximal left ICA without a significant stenosis relative to the more distal vessel. The more distal cervical left ICA is normal. Vertebral arteries: The vertebral arteries are codominant. Both vertebral arteries originate from the subclavian arteries. No significant stenosis is present in either vertebral artery in the neck. The vertebral body heights and alignment are normal. Skeleton: Vertebral body heights and alignment are normal. No focal osseous lesions are present. Other neck: Soft tissues the neck are otherwise unremarkable. Salivary glands are within normal limits.  Thyroid is normal. No significant adenopathy is present. No focal mucosal or submucosal lesions are present. Upper chest: A subpleural lipoma is noted at the right lung apex. Review of the MIP images confirms the above findings CTA HEAD FINDINGS Anterior circulation: The internal carotid arteries are within normal limits from the skull base to the ICA termini. The A1 and M1 segments are normal. MCA bifurcations are within normal limits. The ACA and MCA branch vessels are normal. No aneurysm is present. Posterior circulation: The vertebral arteries are codominant. Vertebrobasilar junction and basilar artery normal. The superior cerebellar arteries are patent bilaterally. The right posterior cerebral artery originates from basilar tip. The left posterior cerebral artery is of fetal type. PCA branch vessels are within normal limits bilaterally. No aneurysm is present. Venous sinuses: The dural sinuses are patent. The straight sinus and deep cerebral veins are intact. Cortical veins are within normal limits. No significant vascular malformation is evident. Anatomic variants: Fetal type left posterior cerebral artery. Review of the MIP images confirms the above findings IMPRESSION: 1. No emergent large vessel occlusion. 2. Atherosclerotic changes at the proximal internal carotid arteries bilaterally without significant stenosis relative to the more distal vessels. 3. Normal variant CTA Circle of Willis without significant proximal stenosis, aneurysm, or branch vessel occlusion. 4.  Aortic Atherosclerosis (ICD10-I70.0). Electronically Signed   By: Marin Roberts M.D.   On: 08/04/2023 19:18   CT HEAD CODE STROKE WO CONTRAST  Result Date: 08/04/2023 CLINICAL DATA:  Code stroke. Neuro deficit, acute, stroke suspected. Patient feels the room is spinning. She is nauseated. EXAM: CT HEAD  WITHOUT CONTRAST TECHNIQUE: Contiguous axial images were obtained from the base of the skull through the vertex without intravenous  contrast. RADIATION DOSE REDUCTION: This exam was performed according to the departmental dose-optimization program which includes automated exposure control, adjustment of the mA and/or kV according to patient size and/or use of iterative reconstruction technique. COMPARISON:  None Available. FINDINGS: Brain: No acute infarct, hemorrhage, or mass lesion is present. No significant white matter lesions are present. Deep brain nuclei are within normal limits. The ventricles are of normal size. No significant extraaxial fluid collection is present. The brainstem and cerebellum are within normal limits. Midline structures are within normal limits. Vascular: No hyperdense vessel or unexpected calcification. Skull: No significant extracranial soft tissue lesion is present. Calvarium is intact. No focal lytic or blastic lesions are present. Hyperostosis is noted. Sinuses/Orbits: The paranasal sinuses and mastoid air cells are clear. The globes and orbits are within normal limits. ASPECTS [facility] Stroke Program Early CT Score) - Ganglionic level infarction (caudate, lentiform nuclei, internal capsule, insula, M1-M3 cortex): 7/7 - Supraganglionic infarction (M4-M6 cortex): 3/3 Total score (0-10 with 10 being normal): 10/10 IMPRESSION: 1. Normal head CT. 2. Aspects is 10/10. The above was relayed via text pager to Dr. Roda Shutters on 08/04/2023 at 16:50 . Electronically Signed   By: Marin Roberts M.D.   On: 08/04/2023 16:52    Vitals:   08/05/23 1100 08/05/23 1228 08/05/23 1306 08/05/23 1337  BP: 115/78  (!) 146/77   Pulse: 65     Resp: 15  18   Temp:  98.5 F (36.9 C) 98.7 F (37.1 C)   TempSrc:  Oral Oral   SpO2: 93%  96%   Weight:    89.1 kg  Height:    5\' 5"  (1.651 m)     PHYSICAL EXAM General:  Alert, well-nourished, well-developed patient in no acute distress Psych:  Mood and affect appropriate for situation Respiratory:  Regular, unlabored respirations on room air   NEURO:  Mental Status: AA&Ox3,  patient is able to give clear and coherent history Speech/Language: speech is without dysarthria or aphasia.  Naming, repetition, fluency, and comprehension intact.  Cranial Nerves:  II: PERRL. Visual fields full.  III, IV, VI: EOMI. Eyelids elevate symmetrically.  V: Sensation is intact to light touch and symmetrical to face.  VII: Face is symmetrical resting and smiling VIII: hearing intact to voice. IX, X: Palate elevates symmetrically. Phonation is normal.  BJ:YNWGNFAO shrug 5/5. XII: tongue is midline without fasciculations. Motor: 5/5 strength to all muscle groups tested.  Tone: is normal and bulk is normal Sensation- Intact to light touch bilaterally. Extinction absent to light touch to DSS.   Coordination: FTN intact bilaterally, HKS: no ataxia in BLE.No drift.  Gait- deferred  Most Recent NIH 0     ASSESSMENT/PLAN  TIA vs Vertigo Etiology: Small vessel disease, uncontrolled risk factors CTA head & neck no LVO, atherosclerotic changes at the proximal internal carotid arteries MRI no acute abnormality 2D Echo normal EF 70 to 75% LDL 183 HgbA1c 6.4 VTE prophylaxis -Lovenox No antithrombotic prior to admission, now on aspirin 81 mg daily  Therapy recommendations:  Pending Disposition:  Home  Hx of Stroke/TIA  None   Hypertension Home meds:  Amlodipine-Valsartan Stable, Blood pressure goal Normotensive     Hyperlipidemia Home meds:  None LDL 183, goal < 70 Add Lipitor 80 mg nightly  Continue statin at discharge   Dysphagia    Diet   Diet regular Room service appropriate? Yes;  Fluid consistency: Thin   Advance diet as tolerated  Hospital day # 0   Windell Norfolk, MD  Stroke Neurology   To contact Stroke Continuity provider, please refer to [email or web address]. After hours, contact General Neurology

## 2023-08-05 NOTE — Evaluation (Signed)
Physical Therapy Evaluation Patient Details Name: REBIE LAVOY MRN: [id number] DOB: [birth year] Today's Date: 08/05/2023  History of Present Illness  72 yo female admitted with vertigo symptoms (dizziness, symptoms, nausea).      Clinical Impression  Vestibular Eval: (-) Gilberto Better. (-) Supine Head Roll Test. (-) nystagmus. Smooth pursuit normal. Pt reports mild dizziness, some blurry vision, some mild hearing changes but denies "stuffiness" "congestion".  She rates these symptoms as a "1 or 2" compared to a "10" on yesterday.   On eval, pt was Mod Ind with mobility. She walked ~150 feet and ascended/descended 2 stairs.  No LOB.   Discussed follow-up: recommend pt f/u with PCP for OP PT vestibular rehab order if symptoms persist/worsen. Recommend she continue medical treatment as currently prescribed by hospitalist.      If plan is discharge home, recommend the following:     Can travel by private vehicle        Equipment Recommendations None recommended by PT  Recommendations for Other Services       Functional Status Assessment Patient has had a recent decline in their functional status and demonstrates the ability to make significant improvements in function in a reasonable and predictable amount of time.     Precautions / Restrictions Precautions Precautions: Fall Restrictions Weight Bearing Restrictions: No      Mobility  Bed Mobility Overal bed mobility: Modified Independent                  Transfers Overall transfer level: Modified independent                      Ambulation/Gait Ambulation/Gait assistance: Modified independent (Device/Increase time) Gait Distance (Feet): 175 Feet Assistive device: None Gait Pattern/deviations: Step-through pattern, Decreased stride length       General Gait Details: Fair gait speed. No LOB.  Stairs Stairs: Yes Stairs assistance: Supervision Stair Management: No rails, Step to  pattern Number of Stairs: 2 General stair comments: No difficulty observed  Wheelchair Mobility     Tilt Bed    Modified Rankin (Stroke Patients Only)       Balance Overall balance assessment: Mild deficits observed, not formally tested                                           Pertinent Vitals/Pain Pain Assessment Pain Assessment: No/denies pain    Home Living Family/patient expects to be discharged to:: Private residence Living Arrangements: Spouse/significant other Available Help at Discharge: Family Type of Home: House Home Access: Stairs to enter Entrance Stairs-Rails: None Secretary/administrator of Steps: 2   Home Layout: One level Home Equipment: None      Prior Function Prior Level of Function : Independent/Modified Independent                     Extremity/Trunk Assessment   Upper Extremity Assessment Upper Extremity Assessment: Overall WFL for tasks assessed    Lower Extremity Assessment Lower Extremity Assessment: Overall WFL for tasks assessed    Cervical / Trunk Assessment Cervical / Trunk Assessment: Normal  Communication   Communication Communication: No apparent difficulties  Cognition Arousal: Alert Behavior During Therapy: WFL for tasks assessed/performed Overall Cognitive Status: Within Functional Limits for tasks assessed  General Comments      Exercises     Assessment/Plan    PT Assessment All further PT needs can be met in the next venue of care (outpatient vestibular rehab if symptoms persist)  PT Problem List         PT Treatment Interventions      PT Goals (Current goals can be found in the Care Plan section)  Acute Rehab PT Goals Patient Stated Goal: home. PT Goal Formulation: All assessment and education complete, DC therapy    Frequency       Co-evaluation               AM-PAC PT "6 Clicks" Mobility  Outcome  Measure Help needed turning from your back to your side while in a flat bed without using bedrails?: None Help needed moving from lying on your back to sitting on the side of a flat bed without using bedrails?: None Help needed moving to and from a bed to a chair (including a wheelchair)?: None Help needed standing up from a chair using your arms (e.g., wheelchair or bedside chair)?: None Help needed to walk in hospital room?: None Help needed climbing 3-5 steps with a railing? : None 6 Click Score: 24    End of Session Equipment Utilized During Treatment: Gait belt Activity Tolerance: Patient tolerated treatment well Patient left: in bed;with call bell/phone within reach;with family/visitor present        Time: 1428-1450 PT Time Calculation (min) (ACUTE ONLY): 22 min   Charges:   PT Evaluation $PT Eval Low Complexity: 1 Low   PT General Charges $$ ACUTE PT VISIT: 1 Visit            Faye Ramsay, PT Acute Rehabilitation  Office: [phone]

## 2023-08-05 NOTE — Progress Notes (Signed)
OT Cancellation Note and Discharge  Patient Details Name: ARAIYAH MOSBRUCKER MRN: [id number] DOB: Feb 02, 1951   Cancelled Treatment:    Reason Eval/Treat Not Completed: OT screened, no needs identified, will sign off. Spoke with evaluating PT and not OT needs were identified during PT eval and PT reports husband is at home with her and can A prn if truly needed.  Lindon Romp OT Acute Rehabilitation Services Office [phone]    Evette Georges 08/05/2023, 4:02 PM

## 2023-08-07 ENCOUNTER — Telehealth: Payer: Self-pay

## 2023-08-07 NOTE — Transitions of Care (Post Inpatient/ED Visit) (Unsigned)
08/07/2023  Name: Kayla Aguirre MRN: [id number] DOB: 01-22-1951  Today's TOC FU Call Status: Today's TOC FU Call Status:: Successful TOC FU Call Completed TOC FU Call Complete Date: 08/07/23 Patient's Name and Date of Birth confirmed.  Transition Care Management Follow-up Telephone Call Date of Discharge: 08/05/23 Discharge Facility: [facility]) Type of Discharge: Inpatient Admission Primary Inpatient Discharge Diagnosis:: Vertigo How have you been since you were released from the hospital?: Better Any questions or concerns?: No  Items Reviewed: Did you receive and understand the discharge instructions provided?: Yes Medications obtained,verified, and reconciled?: Yes (Medications Reviewed) Any new allergies since your discharge?: No Dietary orders reviewed?: NA Do you have support at home?: Yes People in Home: spouse  Medications Reviewed Today: Medications Reviewed Today     Reviewed by Anthoney Harada, LPN (Licensed Practical Nurse) on 08/07/23 at 1209  Med List Status: <None>   Medication Order Taking? Sig Documenting Provider Last Dose Status Informant  amLODIPine-Valsartan-HCTZ (EXFORGE HCT) 10-160-12.5 MG TABS [id number] Yes Take 1 tablet by mouth daily. Plotnikov, Georgina Quint, MD Taking Active Self, Pharmacy Records  aspirin EC 81 MG tablet [id number] Yes Take 1 tablet (81 mg total) by mouth daily. Swallow whole. Rolly Salter, MD Taking Active   atorvastatin (LIPITOR) 80 MG tablet [id number] Yes Take 1 tablet (80 mg total) by mouth daily. Rolly Salter, MD Taking Active   Cholecalciferol (VITAMIN D3) 50 MCG (2000 UT) capsule [id number] No Take 1 capsule (2,000 Units total) by mouth daily.  Patient not taking: Reported on 08/04/2023   Plotnikov, Georgina Quint, MD Not Taking Active Self, Pharmacy Records           Med Note [facility] [place], [place] A   Fri Aug 04, 2023  5:56 PM) Patient states that she has been out of this medication for months.  Cyanocobalamin  (VITAMIN B-12) 1000 MCG SUBL [id number] No Place 1 tablet (1,000 mcg total) under the tongue daily.  Patient not taking: Reported on 08/04/2023   Plotnikov, Georgina Quint, MD Not Taking Active Self, Pharmacy Records           Med Note [facility] [place], [place] A   Fri Aug 04, 2023  5:56 PM) Patient states that she has been out of this medication for months.  famotidine (PEPCID) 40 MG tablet [id number] Yes Take 1 tablet (40 mg total) by mouth daily. Plotnikov, Georgina Quint, MD Taking Active Self, Pharmacy Records  meclizine (ANTIVERT) 25 MG tablet [id number] Yes Take 1 tablet (25 mg total) by mouth 3 (three) times daily as needed for dizziness. Rolly Salter, MD Taking Active   MegaRed Omega-3 Krill Oil [address] [place] CAPS [id number] No Take 1 capsule by mouth every morning.  Patient not taking: Reported on 08/04/2023   Plotnikov, Georgina Quint, MD Not Taking Active Self, Pharmacy Records           Med Note [facility] [place], [place] A   Fri Aug 04, 2023  5:56 PM) Patient states that she has been out of this medication for months.  triamcinolone cream (KENALOG) 0.1 % [id number] Yes Apply 1 Application topically 2 (two) times daily as needed.  Patient taking differently: Apply 1 Application topically 2 (two) times daily as needed (insect bites).   Plotnikov, Georgina Quint, MD Taking Active Self, Pharmacy Records            Home Care and Equipment/Supplies: Were Home Health Services Ordered?: No Any new equipment or medical supplies ordered?: No  Functional Questionnaire: Do  you need assistance with bathing/showering or dressing?: No Do you need assistance with meal preparation?: No Do you need assistance with eating?: No Do you have difficulty maintaining continence: No Do you need assistance with getting out of bed/getting out of a chair/moving?: No Do you have difficulty managing or taking your medications?: No  Follow up appointments reviewed: PCP Follow-up appointment confirmed?: Yes Date of PCP follow-up  appointment?: 08/15/23 Follow-up Provider: [facility] Follow-up appointment confirmed?: NA Do you need transportation to your follow-up appointment?: No Do you understand care options if your condition(s) worsen?: Yes-patient verbalized understanding    SIGNATURE .cbs

## 2023-08-07 NOTE — Discharge Summary (Signed)
Physician Discharge Summary   Patient: Kayla Aguirre MRN: [id number] DOB: Mar 21, 1951  Admit date:     08/04/2023  Discharge date: 08/05/2023  Discharge Physician: Kayla Aguirre  PCP: Kayla Garter, MD  Recommendations at discharge: Follow-up with PCP in 1 week. Repeat lipid panel in 1 month.   Follow-up Information     Aguirre, Kayla Quint, MD. Schedule an appointment as soon as possible for a visit in 1 week(s).   Specialty: Internal Medicine Contact information: [address] [place] [address] [phone]                Discharge Diagnoses: Principal Problem:   Vertigo Active Problems:   Hepatitis  Hospital Course: Kayla Aguirre is a 72 y.o. female with medical history significant of hypretension, reported hypothyroidism and dyslipidemia.   Vertigo Second episode in about 10 years, acute onset.  Vestibular workup was negative in the hospital. MRI brain negative for stroke. CTA showed some atherosclerosis without any other acute abnormality. LDL was elevated. Blood pressure was elevated which improved. Code stroke was called in the ER, neurology has been engaged by ER provider.   Echocardiogram was reassuring. Patient was able to ambulate with the physical therapy. At present we will initiate aspirin and Lipitor and recommend outpatient follow-up.  Hypertensive urgency. Blood pressure was elevated outpatient. Resume home regimen on discharge.   Elevated LFT Seems to be chronic. Outpatient follow up.  Especially now that the patient is on statin.  Hyperlipidemia. LDL 183. Total cholesterol 1-68. Recommend Lipitor.  Obesity Class 1 Body mass index is 32.69 kg/m.  Placing the pt at higher risk of poor outcomes.  Consultants:  Neurology  Procedures performed:  Echocardiogram   DISCHARGE MEDICATION: Allergies as of 08/05/2023       Reactions   Erythromycin    Erythromycin Base    Hydrochlorothiazide    Cramps on 25  mg, ok on 12.5 mg        Medication List     STOP taking these medications    cephALEXin 500 MG capsule Commonly known as: KEFLEX       TAKE these medications    amLODIPine-Valsartan-HCTZ 10-160-12.5 MG Tabs Commonly known as: Exforge HCT Take 1 tablet by mouth daily.   aspirin EC 81 MG tablet Take 1 tablet (81 mg total) by mouth daily. Swallow whole.   atorvastatin 80 MG tablet Commonly known as: LIPITOR Take 1 tablet (80 mg total) by mouth daily.   famotidine 40 MG tablet Commonly known as: Pepcid Take 1 tablet (40 mg total) by mouth daily.   meclizine 25 MG tablet Commonly known as: ANTIVERT Take 1 tablet (25 mg total) by mouth 3 (three) times daily as needed for dizziness.   MegaRed Omega-3 Krill Oil 500 MG Caps Take 1 capsule by mouth every morning.   triamcinolone cream 0.1 % Commonly known as: KENALOG Apply 1 Application topically 2 (two) times daily as needed. What changed: reasons to take this   Vitamin B-12 1000 MCG Subl Place 1 tablet (1,000 mcg total) under the tongue daily.   Vitamin D3 50 MCG (2000 UT) capsule Take 1 capsule (2,000 Units total) by mouth daily.       Disposition: Home Diet recommendation: Cardiac diet  Discharge Exam: Vitals:   08/05/23 1100 08/05/23 1228 08/05/23 1306 08/05/23 1337  BP: 115/78  (!) 146/77   Pulse: 65     Resp: 15  18   Temp:  98.5 F (36.9 C) 98.7 F (  37.1 C)   TempSrc:  Oral Oral   SpO2: 93%  96%   Weight:    89.1 kg  Height:    5\' 5"  (1.651 m)   General: Appear in no distress; no visible Abnormal Neck Mass Or lumps, Conjunctiva normal Cardiovascular: S1 and S2 Present, no Murmur, Respiratory: good respiratory effort, Bilateral Air entry present and CTA, no Crackles, no wheezes Abdomen: Bowel Sound present, Non tender  Extremities: no Pedal edema Neurology: alert and oriented to time, place, and person  Filed Weights   08/05/23 1337  Weight: 89.1 kg   Condition at discharge: stable  The  results of significant diagnostics from this hospitalization (including imaging, microbiology, ancillary and laboratory) are listed below for reference.   Imaging Studies: ECHOCARDIOGRAM COMPLETE  Result Date: 08/05/2023    ECHOCARDIOGRAM REPORT   Patient Name:   Kayla Aguirre [facility] Date of Exam: 08/05/2023 Medical Rec #:  [id number]         Height:       65.0 in Accession #:    [id number]        Weight:       192.0 lb Date of Birth:  1951/08/03         BSA:          1.944 m Patient Age:    72 years          BP:           150/82 mmHg Patient Gender: F                 HR:           81 bpm. Exam Location:  Inpatient Procedure: 2D Echo, Cardiac Doppler, Color Doppler and Strain Analysis Indications:    TIA  History:        Patient has no prior history of Echocardiogram examinations.                 Risk Factors:Hypertension.  Sonographer:    Kayla Aguirre Referring Phys: [id number] [facility] Kayla Aguirre  Sonographer Comments: Global longitudinal strain was attempted. IMPRESSIONS  1. Left ventricular ejection fraction, by estimation, is 70 to 75%. The left ventricle has hyperdynamic function. The left ventricle has no regional wall motion abnormalities. There is mild left ventricular hypertrophy. Left ventricular diastolic parameters are consistent with Grade I diastolic dysfunction (impaired relaxation).  2. Right ventricular systolic function is normal. The right ventricular size is normal. There is normal pulmonary artery systolic pressure. The estimated right ventricular systolic pressure is 22.9 mmHg.  3. The mitral valve is normal in structure. Trivial mitral valve regurgitation. No evidence of mitral stenosis.  4. The aortic valve is grossly normal. Aortic valve regurgitation is not visualized. No aortic stenosis is present.  5. The inferior vena cava is normal in size with greater than 50% respiratory variability, suggesting right atrial pressure of 3 mmHg. FINDINGS  Left Ventricle: Left ventricular ejection fraction, by  estimation, is 70 to 75%. The left ventricle has hyperdynamic function. The left ventricle has no regional wall motion abnormalities. Global longitudinal strain performed but not reported based on interpreter judgement due to suboptimal tracking. The left ventricular internal cavity size was normal in size. There is mild left ventricular hypertrophy. Left ventricular diastolic parameters are consistent with Grade I diastolic dysfunction (impaired relaxation). Right Ventricle: The right ventricular size is normal. No increase in right ventricular wall thickness. Right ventricular systolic function is normal. There is normal pulmonary artery systolic pressure. The tricuspid regurgitant  velocity is 2.23 m/s, and  with an assumed right atrial pressure of 3 mmHg, the estimated right ventricular systolic pressure is 22.9 mmHg. Left Atrium: Left atrial size was normal in size. Right Atrium: Right atrial size was normal in size. Pericardium: There is no evidence of pericardial effusion. Presence of epicardial fat layer. Mitral Valve: The mitral valve is normal in structure. Trivial mitral valve regurgitation. No evidence of mitral valve stenosis. The mean mitral valve gradient is 2.5 mmHg. Tricuspid Valve: The tricuspid valve is normal in structure. Tricuspid valve regurgitation is trivial. No evidence of tricuspid stenosis. Aortic Valve: The aortic valve is grossly normal. Aortic valve regurgitation is not visualized. No aortic stenosis is present. Aortic valve mean gradient measures 4.0 mmHg. Aortic valve peak gradient measures 8.0 mmHg. Aortic valve area, by VTI measures 2.87 cm. Pulmonic Valve: The pulmonic valve was normal in structure. Pulmonic valve regurgitation is not visualized. No evidence of pulmonic stenosis. Aorta: The aortic root is normal in size and structure. Venous: The inferior vena cava is normal in size with greater than 50% respiratory variability, suggesting right atrial pressure of 3 mmHg.  IAS/Shunts: No atrial level shunt detected by color flow Doppler.  LEFT VENTRICLE PLAX 2D LVIDd:         4.30 cm   Diastology LVIDs:         2.20 cm   LV e' medial:    5.87 cm/s LV PW:         1.10 cm   LV E/e' medial:  16.9 LV IVS:        1.00 cm   LV e' lateral:   9.90 cm/s LVOT diam:     2.00 cm   LV E/e' lateral: 10.0 LV SV:         83 LV SV Index:   43 LVOT Area:     3.14 cm  RIGHT VENTRICLE             IVC RV Basal diam:  3.30 cm     IVC diam: 1.50 cm RV S prime:     14.70 cm/s TAPSE (M-mode): 2.4 cm LEFT ATRIUM             Index        RIGHT ATRIUM           Index LA diam:        3.60 cm 1.85 cm/m   RA Area:     15.00 cm LA Vol (A2C):   54.0 ml 27.78 ml/m  RA Volume:   37.10 ml  19.08 ml/m LA Vol (A4C):   47.2 ml 24.28 ml/m LA Biplane Vol: 51.2 ml 26.34 ml/m  AORTIC VALVE AV Area (Vmax):    2.83 cm AV Area (Vmean):   2.74 cm AV Area (VTI):     2.87 cm AV Vmax:           141.00 cm/s AV Vmean:          96.400 cm/s AV VTI:            0.290 m AV Peak Grad:      8.0 mmHg AV Mean Grad:      4.0 mmHg LVOT Vmax:         127.00 cm/s LVOT Vmean:        84.000 cm/s LVOT VTI:          0.265 m LVOT/AV VTI ratio: 0.91  AORTA Ao Root diam: 2.60 cm Ao Asc diam:  2.90  cm MITRAL VALVE                TRICUSPID VALVE MV Area (PHT): 3.77 cm     TR Peak grad:   19.9 mmHg MV Mean grad:  2.5 mmHg     TR Vmax:        223.00 cm/s MV Decel Time: 201 msec MV E velocity: 99.40 cm/s   SHUNTS MV A velocity: 129.00 cm/s  Systemic VTI:  0.26 m MV E/A ratio:  0.77         Systemic Diam: 2.00 cm Weston Brass MD Electronically signed by Weston Brass MD Signature Date/Time: 08/05/2023/2:02:51 PM    Final    MR BRAIN WO CONTRAST  Result Date: 08/05/2023 CLINICAL DATA:  Follow-up examination for stroke. EXAM: MRI HEAD WITHOUT CONTRAST TECHNIQUE: Multiplanar, multiecho pulse sequences of the brain and surrounding structures were obtained without intravenous contrast. COMPARISON:  Prior CT from earlier the same day. FINDINGS:  Brain: Cerebral volume within normal limits. Patchy T2/FLAIR hyperintensity involving the periventricular, deep, and subcortical white matter both cerebral hemispheres, most like related chronic microvascular ischemic disease, mild for age. No evidence for acute or subacute ischemia. Gray-white matter differentiation maintained. No acute or chronic intracranial blood products. No mass lesion, midline shift or mass effect. No hydrocephalus or extra-axial fluid collection. Pituitary gland and suprasellar region within normal limits. Vascular: Major intracranial vascular flow voids are maintained. Skull and upper cervical spine: Craniocervical junctional limits. Bone marrow signal intensity normal. No scalp soft tissue abnormality. Sinuses/Orbits: Globes and orbital soft tissues within normal limits. Mild mucosal thickening present about the ethmoidal air cells. Paranasal sinuses are otherwise clear. No mastoid effusion. Other: None. IMPRESSION: 1. No acute intracranial abnormality. 2. Mild chronic microvascular ischemic disease for age. Electronically Signed   By: Rise Mu M.D.   On: 08/05/2023 01:11   DG CHEST PORT 1 VIEW  Result Date: 08/04/2023 CLINICAL DATA:  Chest pain, dizziness, and nausea EXAM: PORTABLE CHEST 1 VIEW COMPARISON:  None Available. FINDINGS: Single frontal view of the chest demonstrates an unremarkable cardiac silhouette. Lung volumes are diminished, with crowding the central vasculature. No acute airspace disease, effusion, or pneumothorax. No acute bony abnormality. IMPRESSION: 1. Low lung volumes.  No acute airspace disease. Electronically Signed   By: Sharlet Salina M.D.   On: 08/04/2023 19:23   CT Angio Head Neck W WO CM  Result Date: 08/04/2023 CLINICAL DATA:  Neuro deficit, stroke suspected. Dizziness. Patient feels like rim is pending. Nausea. EXAM: CT ANGIOGRAPHY HEAD AND NECK WITH AND WITHOUT CONTRAST TECHNIQUE: Multidetector CT imaging of the head and neck was  performed using the standard protocol during bolus administration of intravenous contrast. Multiplanar CT image reconstructions and MIPs were obtained to evaluate the vascular anatomy. Carotid stenosis measurements (when applicable) are obtained utilizing NASCET criteria, using the distal internal carotid diameter as the denominator. RADIATION DOSE REDUCTION: This exam was performed according to the departmental dose-optimization program which includes automated exposure control, adjustment of the mA and/or kV according to patient size and/or use of iterative reconstruction technique. CONTRAST:  80mL OMNIPAQUE IOHEXOL 350 MG/ML SOLN COMPARISON:  None Available. FINDINGS: CTA NECK FINDINGS Aortic arch: Atherosclerotic calcifications are present in the distal aortic arch beyond the great vessel origins. No aneurysm or stenosis is present. Right carotid system: The right common carotid artery is within normal limits. Atherosclerotic changes are present at the proximal right ICA without significant stenosis relative to the more distal vessel. Cervical right ICA is otherwise  normal. Left carotid system: The left common carotid artery is within normal limits. Irregular noncalcified plaque is present proximal left ICA without a significant stenosis relative to the more distal vessel. The more distal cervical left ICA is normal. Vertebral arteries: The vertebral arteries are codominant. Both vertebral arteries originate from the subclavian arteries. No significant stenosis is present in either vertebral artery in the neck. The vertebral body heights and alignment are normal. Skeleton: Vertebral body heights and alignment are normal. No focal osseous lesions are present. Other neck: Soft tissues the neck are otherwise unremarkable. Salivary glands are within normal limits. Thyroid is normal. No significant adenopathy is present. No focal mucosal or submucosal lesions are present. Upper chest: A subpleural lipoma is noted at  the right lung apex. Review of the MIP images confirms the above findings CTA HEAD FINDINGS Anterior circulation: The internal carotid arteries are within normal limits from the skull base to the ICA termini. The A1 and M1 segments are normal. MCA bifurcations are within normal limits. The ACA and MCA branch vessels are normal. No aneurysm is present. Posterior circulation: The vertebral arteries are codominant. Vertebrobasilar junction and basilar artery normal. The superior cerebellar arteries are patent bilaterally. The right posterior cerebral artery originates from basilar tip. The left posterior cerebral artery is of fetal type. PCA branch vessels are within normal limits bilaterally. No aneurysm is present. Venous sinuses: The dural sinuses are patent. The straight sinus and deep cerebral veins are intact. Cortical veins are within normal limits. No significant vascular malformation is evident. Anatomic variants: Fetal type left posterior cerebral artery. Review of the MIP images confirms the above findings IMPRESSION: 1. No emergent large vessel occlusion. 2. Atherosclerotic changes at the proximal internal carotid arteries bilaterally without significant stenosis relative to the more distal vessels. 3. Normal variant CTA Circle of Willis without significant proximal stenosis, aneurysm, or branch vessel occlusion. 4.  Aortic Atherosclerosis (ICD10-I70.0). Electronically Signed   By: Marin Roberts M.D.   On: 08/04/2023 19:18   CT HEAD CODE STROKE WO CONTRAST  Result Date: 08/04/2023 CLINICAL DATA:  Code stroke. Neuro deficit, acute, stroke suspected. Patient feels the room is spinning. She is nauseated. EXAM: CT HEAD WITHOUT CONTRAST TECHNIQUE: Contiguous axial images were obtained from the base of the skull through the vertex without intravenous contrast. RADIATION DOSE REDUCTION: This exam was performed according to the departmental dose-optimization program which includes automated exposure  control, adjustment of the mA and/or kV according to patient size and/or use of iterative reconstruction technique. COMPARISON:  None Available. FINDINGS: Brain: No acute infarct, hemorrhage, or mass lesion is present. No significant white matter lesions are present. Deep brain nuclei are within normal limits. The ventricles are of normal size. No significant extraaxial fluid collection is present. The brainstem and cerebellum are within normal limits. Midline structures are within normal limits. Vascular: No hyperdense vessel or unexpected calcification. Skull: No significant extracranial soft tissue lesion is present. Calvarium is intact. No focal lytic or blastic lesions are present. Hyperostosis is noted. Sinuses/Orbits: The paranasal sinuses and mastoid air cells are clear. The globes and orbits are within normal limits. ASPECTS [facility] Stroke Program Early CT Score) - Ganglionic level infarction (caudate, lentiform nuclei, internal capsule, insula, M1-M3 cortex): 7/7 - Supraganglionic infarction (M4-M6 cortex): 3/3 Total score (0-10 with 10 being normal): 10/10 IMPRESSION: 1. Normal head CT. 2. Aspects is 10/10. The above was relayed via text pager to Dr. Roda Shutters on 08/04/2023 at 16:50 . Electronically Signed   By: Cristal Deer  Mattern M.D.   On: 08/04/2023 16:52    Microbiology: No results found for this or any previous visit. Labs: CBC: Recent Labs  Lab 08/04/23 1712 08/04/23 1716 08/04/23 2024  WBC  --  8.5 9.7  NEUTROABS  --  6.4  --   HGB 16.0* 15.5* 15.6*  HCT 47.0* 45.0 45.4  MCV  --  84.3 83.5  PLT  --  227 224   Basic Metabolic Panel: Recent Labs  Lab 08/04/23 1712 08/04/23 1716 08/04/23 2024  NA 137 134*  --   K 3.7 3.5  --   CL 104 100  --   CO2  --  23  --   GLUCOSE 168* 166*  --   BUN 16 16  --   CREATININE 0.90 0.79 0.73  CALCIUM  --  9.0  --    Liver Function Tests: Recent Labs  Lab 08/04/23 1716  AST 49*  ALT 61*  ALKPHOS 86  BILITOT 1.1  PROT 7.5  ALBUMIN  4.2   CBG: Recent Labs  Lab 08/04/23 1651  GLUCAP 144*    Discharge time spent: greater than 30 minutes.  Author: Lynden Oxford, MD  [facility] 08/05/2023

## 2023-08-13 NOTE — Telephone Encounter (Signed)
Thank you :)

## 2023-08-15 ENCOUNTER — Ambulatory Visit: Payer: Medicare HMO | Admitting: Internal Medicine

## 2023-08-15 ENCOUNTER — Encounter: Payer: Self-pay | Admitting: Internal Medicine

## 2023-08-15 VITALS — BP 122/70 | HR 94 | Temp 98.6°F | Ht 65.0 in

## 2023-08-15 DIAGNOSIS — R112 Nausea with vomiting, unspecified: Secondary | ICD-10-CM | POA: Insufficient documentation

## 2023-08-15 DIAGNOSIS — E785 Hyperlipidemia, unspecified: Secondary | ICD-10-CM | POA: Diagnosis not present

## 2023-08-15 DIAGNOSIS — D751 Secondary polycythemia: Secondary | ICD-10-CM | POA: Diagnosis not present

## 2023-08-15 DIAGNOSIS — H538 Other visual disturbances: Secondary | ICD-10-CM

## 2023-08-15 DIAGNOSIS — R42 Dizziness and giddiness: Secondary | ICD-10-CM

## 2023-08-15 DIAGNOSIS — E559 Vitamin D deficiency, unspecified: Secondary | ICD-10-CM | POA: Diagnosis not present

## 2023-08-15 DIAGNOSIS — E538 Deficiency of other specified B group vitamins: Secondary | ICD-10-CM

## 2023-08-15 MED ORDER — ONDANSETRON 4 MG PO TBDP: 4.0000 mg | ORAL_TABLET | Freq: Three times a day (TID) | ORAL | 1 refills | Status: AC | PRN

## 2023-08-15 MED ORDER — DIAZEPAM 5 MG PO TABS: 5.0000 mg | ORAL_TABLET | Freq: Three times a day (TID) | ORAL | 1 refills | Status: AC | PRN

## 2023-08-15 NOTE — Assessment & Plan Note (Signed)
Check CBC

## 2023-08-15 NOTE — Assessment & Plan Note (Signed)
Vit D3 and K2

## 2023-08-15 NOTE — Progress Notes (Signed)
Subjective:  Patient ID: Kayla Aguirre, female    DOB: [birth year]  Age: 72 y.o. MRN: [id number]  CC: Hospitalization Follow-up   HPI Kayla Aguirre presents for a LOC (h/o vaso-vagal syncope in the past with car sickness, etc) and vertigo spell at home. BP was high. Pt had a vertigo/n/v spell on 08/04/23. There was HA, blurred vision too... Pt had a similar spell in 2009, but worse   Admit date:     08/04/2023  Discharge date: 08/05/2023  Discharge Physician: Lynden Oxford  PCP: Tresa Garter, MD   Recommendations at discharge: Follow-up with PCP in 1 week. Repeat lipid panel in 1 month.     Follow-up Information       Valin Massie, Georgina Quint, MD. Schedule an appointment as soon as possible for a visit in 1 week(s).   Specialty: Internal Medicine Contact information: [address] [place] [address] [phone]                        Discharge Diagnoses: Principal Problem:   Vertigo Active Problems:   Hepatitis   Hospital Course: Kayla Aguirre is a 72 y.o. female with medical history significant of hypretension, reported hypothyroidism and dyslipidemia.    Vertigo Second episode in about 10 years, acute onset.  Vestibular workup was negative in the hospital. MRI brain negative for stroke. CTA showed some atherosclerosis without any other acute abnormality. LDL was elevated. Blood pressure was elevated which improved. Code stroke was called in the ER, neurology has been engaged by ER provider.   Echocardiogram was reassuring. Patient was able to ambulate with the physical therapy. At present we will initiate aspirin and Lipitor and recommend outpatient follow-up.   Hypertensive urgency. Blood pressure was elevated outpatient. Resume home regimen on discharge.   Elevated LFT Seems to be chronic. Outpatient follow up.  Especially now that the patient is on statin.   Hyperlipidemia. LDL 183. Total cholesterol 1-68. Recommend  Lipitor.   Obesity Class 1 Body mass index is 32.69 kg/m.  Placing the pt at higher risk of poor outcomes.   Consultants:  Neurology   Procedures performed:  Echocardiogram     Outpatient Medications Prior to Visit  Medication Sig Dispense Refill   amLODIPine-Valsartan-HCTZ (EXFORGE HCT) 10-160-12.5 MG TABS Take 1 tablet by mouth daily. 90 tablet 3   aspirin EC 81 MG tablet Take 1 tablet (81 mg total) by mouth daily. Swallow whole. 150 tablet 0   atorvastatin (LIPITOR) 80 MG tablet Take 1 tablet (80 mg total) by mouth daily. 30 tablet 0   famotidine (PEPCID) 40 MG tablet Take 1 tablet (40 mg total) by mouth daily. 90 tablet 3   meclizine (ANTIVERT) 25 MG tablet Take 1 tablet (25 mg total) by mouth 3 (three) times daily as needed for dizziness. 30 tablet 0   triamcinolone cream (KENALOG) 0.1 % Apply 1 Application topically 2 (two) times daily as needed. (Patient taking differently: Apply 1 Application topically 2 (two) times daily as needed (insect bites).) 80 g 3   Cholecalciferol (VITAMIN D3) 50 MCG (2000 UT) capsule Take 1 capsule (2,000 Units total) by mouth daily. (Patient not taking: Reported on 08/04/2023) 100 capsule 3   Cyanocobalamin (VITAMIN B-12) 1000 MCG SUBL Place 1 tablet (1,000 mcg total) under the tongue daily. (Patient not taking: Reported on 08/04/2023) 100 tablet 3   MegaRed Omega-3 Krill Oil 500 MG CAPS Take 1 capsule by mouth every morning. (Patient not  taking: Reported on 08/04/2023) 100 capsule 3   No facility-administered medications prior to visit.    ROS: Review of Systems  Constitutional:  Negative for activity change, appetite change, chills, fatigue and unexpected weight change.  HENT:  Negative for congestion, mouth sores and sinus pressure.   Eyes:  Negative for visual disturbance.  Respiratory:  Negative for cough and chest tightness.   Gastrointestinal:  Negative for abdominal pain and nausea.  Genitourinary:  Negative for difficulty urinating,  frequency and vaginal pain.  Musculoskeletal:  Negative for back pain and gait problem.  Skin:  Negative for pallor and rash.  Neurological:  Negative for dizziness, tremors, weakness, numbness and headaches.  Psychiatric/Behavioral:  Negative for confusion and sleep disturbance.     Objective:  BP 122/70 (BP Location: Right Arm, Patient Position: Sitting, Cuff Size: Normal)   Pulse 94   Temp 98.6 F (37 C) (Oral)   Ht 5\' 5"  (1.651 m)   SpO2 96%   BMI 32.69 kg/m   BP Readings from Last 3 Encounters:  08/15/23 122/70  08/05/23 (!) 146/77  06/19/23 120/80    Wt Readings from Last 3 Encounters:  08/05/23 196 lb 6.9 oz (89.1 kg)  06/19/23 192 lb (87.1 kg)  03/16/23 192 lb (87.1 kg)    Physical Exam Constitutional:      General: She is not in acute distress.    Appearance: She is well-developed. She is obese.  HENT:     Head: Normocephalic.     Right Ear: External ear normal.     Left Ear: External ear normal.     Nose: Nose normal.  Eyes:     General:        Right eye: No discharge.        Left eye: No discharge.     Conjunctiva/sclera: Conjunctivae normal.     Pupils: Pupils are equal, round, and reactive to light.  Neck:     Thyroid: No thyromegaly.     Vascular: No JVD.     Trachea: No tracheal deviation.  Cardiovascular:     Rate and Rhythm: Normal rate and regular rhythm.     Heart sounds: Normal heart sounds.  Pulmonary:     Effort: No respiratory distress.     Breath sounds: No stridor. No wheezing.  Abdominal:     General: Bowel sounds are normal. There is no distension.     Palpations: Abdomen is soft. There is no mass.     Tenderness: There is no abdominal tenderness. There is no guarding or rebound.  Musculoskeletal:        General: No tenderness.     Cervical back: Normal range of motion and neck supple. No rigidity.  Lymphadenopathy:     Cervical: No cervical adenopathy.  Skin:    Findings: No erythema or rash.  Neurological:     Cranial  Nerves: No cranial nerve deficit.     Motor: No abnormal muscle tone.     Coordination: Coordination normal.     Deep Tendon Reflexes: Reflexes normal.  Psychiatric:        Behavior: Behavior normal.        Thought Content: Thought content normal.        Judgment: Judgment normal.     Lab Results  Component Value Date   WBC 9.7 08/04/2023   HGB 15.6 (H) 08/04/2023   HCT 45.4 08/04/2023   PLT 224 08/04/2023   GLUCOSE 166 (H) 08/04/2023   CHOL 268 (H) 08/05/2023  TRIG 144 08/05/2023   HDL 56 08/05/2023   LDLDIRECT 169.0 03/17/2023   LDLCALC 183 (H) 08/05/2023   ALT 61 (H) 08/04/2023   AST 49 (H) 08/04/2023   NA 134 (L) 08/04/2023   K 3.5 08/04/2023   CL 100 08/04/2023   CREATININE 0.73 08/04/2023   BUN 16 08/04/2023   CO2 23 08/04/2023   TSH 5.75 (H) 02/22/2022   INR 1.0 08/05/2023   HGBA1C 6.4 (H) 08/05/2023    ECHOCARDIOGRAM COMPLETE  Result Date: 08/05/2023    ECHOCARDIOGRAM REPORT   Patient Name:   Kayla Aguirre [facility] Date of Exam: 08/05/2023 Medical Rec #:  [id number]         Height:       65.0 in Accession #:    [id number]        Weight:       192.0 lb Date of Birth:  1951/01/29         BSA:          1.944 m Patient Age:    72 years          BP:           150/82 mmHg Patient Gender: F                 HR:           81 bpm. Exam Location:  Inpatient Procedure: 2D Echo, Cardiac Doppler, Color Doppler and Strain Analysis Indications:    TIA  History:        Patient has no prior history of Echocardiogram examinations.                 Risk Factors:Hypertension.  Sonographer:    Karma Ganja Referring Phys: [id number] [facility] GOEL  Sonographer Comments: Global longitudinal strain was attempted. IMPRESSIONS  1. Left ventricular ejection fraction, by estimation, is 70 to 75%. The left ventricle has hyperdynamic function. The left ventricle has no regional wall motion abnormalities. There is mild left ventricular hypertrophy. Left ventricular diastolic parameters are consistent with Grade I  diastolic dysfunction (impaired relaxation).  2. Right ventricular systolic function is normal. The right ventricular size is normal. There is normal pulmonary artery systolic pressure. The estimated right ventricular systolic pressure is 22.9 mmHg.  3. The mitral valve is normal in structure. Trivial mitral valve regurgitation. No evidence of mitral stenosis.  4. The aortic valve is grossly normal. Aortic valve regurgitation is not visualized. No aortic stenosis is present.  5. The inferior vena cava is normal in size with greater than 50% respiratory variability, suggesting right atrial pressure of 3 mmHg. FINDINGS  Left Ventricle: Left ventricular ejection fraction, by estimation, is 70 to 75%. The left ventricle has hyperdynamic function. The left ventricle has no regional wall motion abnormalities. Global longitudinal strain performed but not reported based on interpreter judgement due to suboptimal tracking. The left ventricular internal cavity size was normal in size. There is mild left ventricular hypertrophy. Left ventricular diastolic parameters are consistent with Grade I diastolic dysfunction (impaired relaxation). Right Ventricle: The right ventricular size is normal. No increase in right ventricular wall thickness. Right ventricular systolic function is normal. There is normal pulmonary artery systolic pressure. The tricuspid regurgitant velocity is 2.23 m/s, and  with an assumed right atrial pressure of 3 mmHg, the estimated right ventricular systolic pressure is 22.9 mmHg. Left Atrium: Left atrial size was normal in size. Right Atrium: Right atrial size was normal in size. Pericardium: There is no evidence of  pericardial effusion. Presence of epicardial fat layer. Mitral Valve: The mitral valve is normal in structure. Trivial mitral valve regurgitation. No evidence of mitral valve stenosis. The mean mitral valve gradient is 2.5 mmHg. Tricuspid Valve: The tricuspid valve is normal in structure.  Tricuspid valve regurgitation is trivial. No evidence of tricuspid stenosis. Aortic Valve: The aortic valve is grossly normal. Aortic valve regurgitation is not visualized. No aortic stenosis is present. Aortic valve mean gradient measures 4.0 mmHg. Aortic valve peak gradient measures 8.0 mmHg. Aortic valve area, by VTI measures 2.87 cm. Pulmonic Valve: The pulmonic valve was normal in structure. Pulmonic valve regurgitation is not visualized. No evidence of pulmonic stenosis. Aorta: The aortic root is normal in size and structure. Venous: The inferior vena cava is normal in size with greater than 50% respiratory variability, suggesting right atrial pressure of 3 mmHg. IAS/Shunts: No atrial level shunt detected by color flow Doppler.  LEFT VENTRICLE PLAX 2D LVIDd:         4.30 cm   Diastology LVIDs:         2.20 cm   LV e' medial:    5.87 cm/s LV PW:         1.10 cm   LV E/e' medial:  16.9 LV IVS:        1.00 cm   LV e' lateral:   9.90 cm/s LVOT diam:     2.00 cm   LV E/e' lateral: 10.0 LV SV:         83 LV SV Index:   43 LVOT Area:     3.14 cm  RIGHT VENTRICLE             IVC RV Basal diam:  3.30 cm     IVC diam: 1.50 cm RV S prime:     14.70 cm/s TAPSE (M-mode): 2.4 cm LEFT ATRIUM             Index        RIGHT ATRIUM           Index LA diam:        3.60 cm 1.85 cm/m   RA Area:     15.00 cm LA Vol (A2C):   54.0 ml 27.78 ml/m  RA Volume:   37.10 ml  19.08 ml/m LA Vol (A4C):   47.2 ml 24.28 ml/m LA Biplane Vol: 51.2 ml 26.34 ml/m  AORTIC VALVE AV Area (Vmax):    2.83 cm AV Area (Vmean):   2.74 cm AV Area (VTI):     2.87 cm AV Vmax:           141.00 cm/s AV Vmean:          96.400 cm/s AV VTI:            0.290 m AV Peak Grad:      8.0 mmHg AV Mean Grad:      4.0 mmHg LVOT Vmax:         127.00 cm/s LVOT Vmean:        84.000 cm/s LVOT VTI:          0.265 m LVOT/AV VTI ratio: 0.91  AORTA Ao Root diam: 2.60 cm Ao Asc diam:  2.90 cm MITRAL VALVE                TRICUSPID VALVE MV Area (PHT): 3.77 cm     TR Peak  grad:   19.9 mmHg MV Mean grad:  2.5 mmHg     TR  Vmax:        223.00 cm/s MV Decel Time: 201 msec MV E velocity: 99.40 cm/s   SHUNTS MV A velocity: 129.00 cm/s  Systemic VTI:  0.26 m MV E/A ratio:  0.77         Systemic Diam: 2.00 cm Weston Brass MD Electronically signed by Weston Brass MD Signature Date/Time: 08/05/2023/2:02:51 PM    Final    MR BRAIN WO CONTRAST  Result Date: 08/05/2023 CLINICAL DATA:  Follow-up examination for stroke. EXAM: MRI HEAD WITHOUT CONTRAST TECHNIQUE: Multiplanar, multiecho pulse sequences of the brain and surrounding structures were obtained without intravenous contrast. COMPARISON:  Prior CT from earlier the same day. FINDINGS: Brain: Cerebral volume within normal limits. Patchy T2/FLAIR hyperintensity involving the periventricular, deep, and subcortical white matter both cerebral hemispheres, most like related chronic microvascular ischemic disease, mild for age. No evidence for acute or subacute ischemia. Gray-white matter differentiation maintained. No acute or chronic intracranial blood products. No mass lesion, midline shift or mass effect. No hydrocephalus or extra-axial fluid collection. Pituitary gland and suprasellar region within normal limits. Vascular: Major intracranial vascular flow voids are maintained. Skull and upper cervical spine: Craniocervical junctional limits. Bone marrow signal intensity normal. No scalp soft tissue abnormality. Sinuses/Orbits: Globes and orbital soft tissues within normal limits. Mild mucosal thickening present about the ethmoidal air cells. Paranasal sinuses are otherwise clear. No mastoid effusion. Other: None. IMPRESSION: 1. No acute intracranial abnormality. 2. Mild chronic microvascular ischemic disease for age. Electronically Signed   By: Rise Mu M.D.   On: 08/05/2023 01:11   DG CHEST PORT 1 VIEW  Result Date: 08/04/2023 CLINICAL DATA:  Chest pain, dizziness, and nausea EXAM: PORTABLE CHEST 1 VIEW COMPARISON:   None Available. FINDINGS: Single frontal view of the chest demonstrates an unremarkable cardiac silhouette. Lung volumes are diminished, with crowding the central vasculature. No acute airspace disease, effusion, or pneumothorax. No acute bony abnormality. IMPRESSION: 1. Low lung volumes.  No acute airspace disease. Electronically Signed   By: Sharlet Salina M.D.   On: 08/04/2023 19:23   CT Angio Head Neck W WO CM  Result Date: 08/04/2023 CLINICAL DATA:  Neuro deficit, stroke suspected. Dizziness. Patient feels like rim is pending. Nausea. EXAM: CT ANGIOGRAPHY HEAD AND NECK WITH AND WITHOUT CONTRAST TECHNIQUE: Multidetector CT imaging of the head and neck was performed using the standard protocol during bolus administration of intravenous contrast. Multiplanar CT image reconstructions and MIPs were obtained to evaluate the vascular anatomy. Carotid stenosis measurements (when applicable) are obtained utilizing NASCET criteria, using the distal internal carotid diameter as the denominator. RADIATION DOSE REDUCTION: This exam was performed according to the departmental dose-optimization program which includes automated exposure control, adjustment of the mA and/or kV according to patient size and/or use of iterative reconstruction technique. CONTRAST:  80mL OMNIPAQUE IOHEXOL 350 MG/ML SOLN COMPARISON:  None Available. FINDINGS: CTA NECK FINDINGS Aortic arch: Atherosclerotic calcifications are present in the distal aortic arch beyond the great vessel origins. No aneurysm or stenosis is present. Right carotid system: The right common carotid artery is within normal limits. Atherosclerotic changes are present at the proximal right ICA without significant stenosis relative to the more distal vessel. Cervical right ICA is otherwise normal. Left carotid system: The left common carotid artery is within normal limits. Irregular noncalcified plaque is present proximal left ICA without a significant stenosis relative to  the more distal vessel. The more distal cervical left ICA is normal. Vertebral arteries: The vertebral arteries are codominant. Both  vertebral arteries originate from the subclavian arteries. No significant stenosis is present in either vertebral artery in the neck. The vertebral body heights and alignment are normal. Skeleton: Vertebral body heights and alignment are normal. No focal osseous lesions are present. Other neck: Soft tissues the neck are otherwise unremarkable. Salivary glands are within normal limits. Thyroid is normal. No significant adenopathy is present. No focal mucosal or submucosal lesions are present. Upper chest: A subpleural lipoma is noted at the right lung apex. Review of the MIP images confirms the above findings CTA HEAD FINDINGS Anterior circulation: The internal carotid arteries are within normal limits from the skull base to the ICA termini. The A1 and M1 segments are normal. MCA bifurcations are within normal limits. The ACA and MCA branch vessels are normal. No aneurysm is present. Posterior circulation: The vertebral arteries are codominant. Vertebrobasilar junction and basilar artery normal. The superior cerebellar arteries are patent bilaterally. The right posterior cerebral artery originates from basilar tip. The left posterior cerebral artery is of fetal type. PCA branch vessels are within normal limits bilaterally. No aneurysm is present. Venous sinuses: The dural sinuses are patent. The straight sinus and deep cerebral veins are intact. Cortical veins are within normal limits. No significant vascular malformation is evident. Anatomic variants: Fetal type left posterior cerebral artery. Review of the MIP images confirms the above findings IMPRESSION: 1. No emergent large vessel occlusion. 2. Atherosclerotic changes at the proximal internal carotid arteries bilaterally without significant stenosis relative to the more distal vessels. 3. Normal variant CTA Circle of Willis without  significant proximal stenosis, aneurysm, or branch vessel occlusion. 4.  Aortic Atherosclerosis (ICD10-I70.0). Electronically Signed   By: Marin Roberts M.D.   On: 08/04/2023 19:18   CT HEAD CODE STROKE WO CONTRAST  Result Date: 08/04/2023 CLINICAL DATA:  Code stroke. Neuro deficit, acute, stroke suspected. Patient feels the room is spinning. She is nauseated. EXAM: CT HEAD WITHOUT CONTRAST TECHNIQUE: Contiguous axial images were obtained from the base of the skull through the vertex without intravenous contrast. RADIATION DOSE REDUCTION: This exam was performed according to the departmental dose-optimization program which includes automated exposure control, adjustment of the mA and/or kV according to patient size and/or use of iterative reconstruction technique. COMPARISON:  None Available. FINDINGS: Brain: No acute infarct, hemorrhage, or mass lesion is present. No significant white matter lesions are present. Deep brain nuclei are within normal limits. The ventricles are of normal size. No significant extraaxial fluid collection is present. The brainstem and cerebellum are within normal limits. Midline structures are within normal limits. Vascular: No hyperdense vessel or unexpected calcification. Skull: No significant extracranial soft tissue lesion is present. Calvarium is intact. No focal lytic or blastic lesions are present. Hyperostosis is noted. Sinuses/Orbits: The paranasal sinuses and mastoid air cells are clear. The globes and orbits are within normal limits. ASPECTS [facility] Stroke Program Early CT Score) - Ganglionic level infarction (caudate, lentiform nuclei, internal capsule, insula, M1-M3 cortex): 7/7 - Supraganglionic infarction (M4-M6 cortex): 3/3 Total score (0-10 with 10 being normal): 10/10 IMPRESSION: 1. Normal head CT. 2. Aspects is 10/10. The above was relayed via text pager to Dr. Roda Shutters on 08/04/2023 at 16:50 . Electronically Signed   By: Marin Roberts M.D.   On:  08/04/2023 16:52    Assessment & Plan:   Problem List Items Addressed This Visit     Dyslipidemia    Coronary calcium score of 0. This is a low risk study. On Krill oil  B12 deficiency - Primary    On B12      Vitamin D deficiency    Vit D3 and K2      Polycythemia    Check CBC      Vertigo    Severe episode A h/o vaso-vagal syncope in the past with car sickness, eetc Diazepam, Zofran, Meclizine prn Benign Positional Vertigo symptoms  Start Francee Piccolo - Daroff exercise several times a day as dirrected. Doing  well now      Nausea & vomiting    Severe episode A h/o vaso-vagal syncope in the past with car sickness, eetc Diazepam, Zofran, Meclizine prn Benign Positional Vertigo symptoms  Start Francee Piccolo - Daroff exercise several times a day as dirrected. Doing  well now      Other Visit Diagnoses     Blurred vision       Relevant Orders   Ambulatory referral to Ophthalmology         Meds ordered this encounter  Medications   diazepam (VALIUM) 5 MG tablet    Sig: Take 1 tablet (5 mg total) by mouth every 8 (eight) hours as needed (severe vertigo).    Dispense:  30 tablet    Refill:  1   ondansetron (ZOFRAN-ODT) 4 MG disintegrating tablet    Sig: Take 1 tablet (4 mg total) by mouth every 8 (eight) hours as needed for nausea or vomiting.    Dispense:  20 tablet    Refill:  1      Follow-up: Return in about 3 months (around 11/15/2023) for a follow-up visit.  Sonda Primes, MD

## 2023-08-15 NOTE — Assessment & Plan Note (Addendum)
Severe episode A h/o vaso-vagal syncope in the past with car sickness, eetc Diazepam, Zofran, Meclizine prn Benign Positional Vertigo symptoms  Start Kayla Aguirre - Daroff exercise several times a day as dirrected. Doing  well now

## 2023-08-15 NOTE — Assessment & Plan Note (Addendum)
Severe episode A h/o vaso-vagal syncope in the past with car sickness, eetc Diazepam, Zofran, Meclizine prn Benign Positional Vertigo symptoms  Start Francee Piccolo - Daroff exercise several times a day as dirrected. Doing  well now

## 2023-08-15 NOTE — Assessment & Plan Note (Signed)
On B12

## 2023-08-15 NOTE — Assessment & Plan Note (Signed)
Coronary calcium score of 0. This is a low risk study. On Krill oil

## 2023-08-20 ENCOUNTER — Encounter: Payer: Self-pay | Admitting: Internal Medicine

## 2023-08-31 ENCOUNTER — Telehealth: Payer: Self-pay

## 2023-08-31 NOTE — Telephone Encounter (Signed)
*  Primary  Pharmacy Patient Advocate Encounter   Received notification from CoverMyMeds that prior authorization for diazePAM 5MG  tablets  is required/requested.   Insurance verification completed.   The patient is insured through [employer] [facility] .   Per test claim: PA required; PA submitted to above mentioned insurance via CoverMyMeds Key/confirmation #/EOC [facility] Status is pending

## 2023-08-31 NOTE — Telephone Encounter (Signed)
Pharmacy Patient Advocate Encounter  Received notification from [employer] [facility] that Prior Authorization for DIAZEPAM 5MG  has been DENIED.  Full denial letter will be uploaded to the media tab. See denial reason below.   PA #/Case ID/Reference #: [id number]

## 2023-09-18 ENCOUNTER — Ambulatory Visit: Payer: Medicare HMO | Admitting: Internal Medicine

## 2023-09-18 ENCOUNTER — Encounter: Payer: Self-pay | Admitting: Internal Medicine

## 2023-09-18 VITALS — BP 118/70 | HR 95 | Temp 98.2°F | Ht 65.0 in | Wt 192.0 lb

## 2023-09-18 DIAGNOSIS — E559 Vitamin D deficiency, unspecified: Secondary | ICD-10-CM | POA: Diagnosis not present

## 2023-09-18 DIAGNOSIS — D751 Secondary polycythemia: Secondary | ICD-10-CM

## 2023-09-18 DIAGNOSIS — E039 Hypothyroidism, unspecified: Secondary | ICD-10-CM

## 2023-09-18 DIAGNOSIS — K760 Fatty (change of) liver, not elsewhere classified: Secondary | ICD-10-CM | POA: Diagnosis not present

## 2023-09-18 DIAGNOSIS — E785 Hyperlipidemia, unspecified: Secondary | ICD-10-CM | POA: Diagnosis not present

## 2023-09-18 DIAGNOSIS — E538 Deficiency of other specified B group vitamins: Secondary | ICD-10-CM

## 2023-09-18 DIAGNOSIS — N3090 Cystitis, unspecified without hematuria: Secondary | ICD-10-CM | POA: Diagnosis not present

## 2023-09-18 MED ORDER — CEPHALEXIN 500 MG PO CAPS: ORAL_CAPSULE | ORAL | 1 refills | Status: DC

## 2023-09-18 NOTE — Patient Instructions (Signed)
Use Milk thistle

## 2023-09-18 NOTE — Assessment & Plan Note (Signed)
Check CBC 

## 2023-09-18 NOTE — Assessment & Plan Note (Signed)
Vit D3 and K2 

## 2023-09-18 NOTE — Progress Notes (Signed)
Subjective:  Patient ID: Kayla Aguirre, female    DOB: 12-01-1950  Age: 72 y.o. MRN: [id number]  CC: Medical Management of Chronic Issues (3 mnth f/u)   HPI Kayla Aguirre presents for HTN, CAD, fatty liver F/u UTI   Outpatient Medications Prior to Visit  Medication Sig Dispense Refill   amLODIPine-Valsartan-HCTZ (EXFORGE HCT) 10-160-12.5 MG TABS Take 1 tablet by mouth daily. 90 tablet 3   aspirin EC 81 MG tablet Take 1 tablet (81 mg total) by mouth daily. Swallow whole. 150 tablet 0   diazepam (VALIUM) 5 MG tablet Take 1 tablet (5 mg total) by mouth every 8 (eight) hours as needed (severe vertigo). 30 tablet 1   famotidine (PEPCID) 40 MG tablet Take 1 tablet (40 mg total) by mouth daily. 90 tablet 3   meclizine (ANTIVERT) 25 MG tablet Take 1 tablet (25 mg total) by mouth 3 (three) times daily as needed for dizziness. 30 tablet 0   ondansetron (ZOFRAN-ODT) 4 MG disintegrating tablet Take 1 tablet (4 mg total) by mouth every 8 (eight) hours as needed for nausea or vomiting. 20 tablet 1   atorvastatin (LIPITOR) 80 MG tablet Take 1 tablet (80 mg total) by mouth daily. 30 tablet 0   Cholecalciferol (VITAMIN D3) 50 MCG (2000 UT) capsule Take 1 capsule (2,000 Units total) by mouth daily. (Patient not taking: Reported on 08/04/2023) 100 capsule 3   Cyanocobalamin (VITAMIN B-12) 1000 MCG SUBL Place 1 tablet (1,000 mcg total) under the tongue daily. (Patient not taking: Reported on 08/04/2023) 100 tablet 3   MegaRed Omega-3 Krill Oil 500 MG CAPS Take 1 capsule by mouth every morning. (Patient not taking: Reported on 08/04/2023) 100 capsule 3   triamcinolone cream (KENALOG) 0.1 % Apply 1 Application topically 2 (two) times daily as needed. (Patient not taking: Reported on 09/18/2023) 80 g 3   No facility-administered medications prior to visit.    ROS: Review of Systems  Constitutional:  Negative for activity change, appetite change, chills, fatigue and unexpected weight change.  HENT:   Negative for congestion, mouth sores and sinus pressure.   Eyes:  Negative for visual disturbance.  Respiratory:  Negative for cough and chest tightness.   Gastrointestinal:  Negative for abdominal pain and nausea.  Genitourinary:  Negative for difficulty urinating, frequency and vaginal pain.  Musculoskeletal:  Negative for back pain and gait problem.  Skin:  Negative for pallor and rash.  Neurological:  Negative for dizziness, tremors, weakness, numbness and headaches.  Psychiatric/Behavioral:  Negative for confusion and sleep disturbance. The patient is not nervous/anxious.     Objective:  BP 118/70 (BP Location: Left Arm, Patient Position: Sitting, Cuff Size: Normal)   Pulse 95   Temp 98.2 F (36.8 C) (Oral)   Ht 5\' 5"  (1.651 m)   Wt 192 lb (87.1 kg)   SpO2 95%   BMI 31.95 kg/m   BP Readings from Last 3 Encounters:  09/18/23 118/70  08/15/23 122/70  08/05/23 (!) 146/77    Wt Readings from Last 3 Encounters:  09/18/23 192 lb (87.1 kg)  08/05/23 196 lb 6.9 oz (89.1 kg)  06/19/23 192 lb (87.1 kg)    Physical Exam Constitutional:      General: She is not in acute distress.    Appearance: She is well-developed.  HENT:     Head: Normocephalic.     Right Ear: External ear normal.     Left Ear: External ear normal.     Nose: Nose normal.  Eyes:     General:        Right eye: No discharge.        Left eye: No discharge.     Conjunctiva/sclera: Conjunctivae normal.     Pupils: Pupils are equal, round, and reactive to light.  Neck:     Thyroid: No thyromegaly.     Vascular: No JVD.     Trachea: No tracheal deviation.  Cardiovascular:     Rate and Rhythm: Normal rate and regular rhythm.     Heart sounds: Normal heart sounds.  Pulmonary:     Effort: No respiratory distress.     Breath sounds: No stridor. No wheezing.  Abdominal:     General: Bowel sounds are normal. There is no distension.     Palpations: Abdomen is soft. There is no mass.     Tenderness: There is  no abdominal tenderness. There is no guarding or rebound.  Musculoskeletal:        General: No tenderness.     Cervical back: Normal range of motion and neck supple. No rigidity.  Lymphadenopathy:     Cervical: No cervical adenopathy.  Skin:    Findings: No erythema or rash.  Neurological:     Cranial Nerves: No cranial nerve deficit.     Motor: No abnormal muscle tone.     Coordination: Coordination normal.     Deep Tendon Reflexes: Reflexes normal.  Psychiatric:        Behavior: Behavior normal.        Thought Content: Thought content normal.        Judgment: Judgment normal.     Lab Results  Component Value Date   WBC 9.7 08/04/2023   HGB 15.6 (H) 08/04/2023   HCT 45.4 08/04/2023   PLT 224 08/04/2023   GLUCOSE 166 (H) 08/04/2023   CHOL 268 (H) 08/05/2023   TRIG 144 08/05/2023   HDL 56 08/05/2023   LDLDIRECT 169.0 03/17/2023   LDLCALC 183 (H) 08/05/2023   ALT 61 (H) 08/04/2023   AST 49 (H) 08/04/2023   NA 134 (L) 08/04/2023   K 3.5 08/04/2023   CL 100 08/04/2023   CREATININE 0.73 08/04/2023   BUN 16 08/04/2023   CO2 23 08/04/2023   TSH 5.75 (H) 02/22/2022   INR 1.0 08/05/2023   HGBA1C 6.4 (H) 08/05/2023    ECHOCARDIOGRAM COMPLETE Result Date: 08/05/2023    ECHOCARDIOGRAM REPORT   Patient Name:   Kayla Aguirre [facility] Date of Exam: 08/05/2023 Medical Rec #:  [id number]         Height:       65.0 in Accession #:    [id number]        Weight:       192.0 lb Date of Birth:  [birth year]         BSA:          1.944 m Patient Age:    72 years          BP:           150/82 mmHg Patient Gender: F                 HR:           81 bpm. Exam Location:  Inpatient Procedure: 2D Echo, Cardiac Doppler, Color Doppler and Strain Analysis Indications:    TIA  History:        Patient has no prior history of Echocardiogram examinations.  Risk Factors:Hypertension.  Sonographer:    Karma Ganja Referring Phys: [id number] [facility] GOEL  Sonographer Comments: Global longitudinal strain was  attempted. IMPRESSIONS  1. Left ventricular ejection fraction, by estimation, is 70 to 75%. The left ventricle has hyperdynamic function. The left ventricle has no regional wall motion abnormalities. There is mild left ventricular hypertrophy. Left ventricular diastolic parameters are consistent with Grade I diastolic dysfunction (impaired relaxation).  2. Right ventricular systolic function is normal. The right ventricular size is normal. There is normal pulmonary artery systolic pressure. The estimated right ventricular systolic pressure is 22.9 mmHg.  3. The mitral valve is normal in structure. Trivial mitral valve regurgitation. No evidence of mitral stenosis.  4. The aortic valve is grossly normal. Aortic valve regurgitation is not visualized. No aortic stenosis is present.  5. The inferior vena cava is normal in size with greater than 50% respiratory variability, suggesting right atrial pressure of 3 mmHg. FINDINGS  Left Ventricle: Left ventricular ejection fraction, by estimation, is 70 to 75%. The left ventricle has hyperdynamic function. The left ventricle has no regional wall motion abnormalities. Global longitudinal strain performed but not reported based on interpreter judgement due to suboptimal tracking. The left ventricular internal cavity size was normal in size. There is mild left ventricular hypertrophy. Left ventricular diastolic parameters are consistent with Grade I diastolic dysfunction (impaired relaxation). Right Ventricle: The right ventricular size is normal. No increase in right ventricular wall thickness. Right ventricular systolic function is normal. There is normal pulmonary artery systolic pressure. The tricuspid regurgitant velocity is 2.23 m/s, and  with an assumed right atrial pressure of 3 mmHg, the estimated right ventricular systolic pressure is 22.9 mmHg. Left Atrium: Left atrial size was normal in size. Right Atrium: Right atrial size was normal in size. Pericardium: There is  no evidence of pericardial effusion. Presence of epicardial fat layer. Mitral Valve: The mitral valve is normal in structure. Trivial mitral valve regurgitation. No evidence of mitral valve stenosis. The mean mitral valve gradient is 2.5 mmHg. Tricuspid Valve: The tricuspid valve is normal in structure. Tricuspid valve regurgitation is trivial. No evidence of tricuspid stenosis. Aortic Valve: The aortic valve is grossly normal. Aortic valve regurgitation is not visualized. No aortic stenosis is present. Aortic valve mean gradient measures 4.0 mmHg. Aortic valve peak gradient measures 8.0 mmHg. Aortic valve area, by VTI measures 2.87 cm. Pulmonic Valve: The pulmonic valve was normal in structure. Pulmonic valve regurgitation is not visualized. No evidence of pulmonic stenosis. Aorta: The aortic root is normal in size and structure. Venous: The inferior vena cava is normal in size with greater than 50% respiratory variability, suggesting right atrial pressure of 3 mmHg. IAS/Shunts: No atrial level shunt detected by color flow Doppler.  LEFT VENTRICLE PLAX 2D LVIDd:         4.30 cm   Diastology LVIDs:         2.20 cm   LV e' medial:    5.87 cm/s LV PW:         1.10 cm   LV E/e' medial:  16.9 LV IVS:        1.00 cm   LV e' lateral:   9.90 cm/s LVOT diam:     2.00 cm   LV E/e' lateral: 10.0 LV SV:         83 LV SV Index:   43 LVOT Area:     3.14 cm  RIGHT VENTRICLE  IVC RV Basal diam:  3.30 cm     IVC diam: 1.50 cm RV S prime:     14.70 cm/s TAPSE (M-mode): 2.4 cm LEFT ATRIUM             Index        RIGHT ATRIUM           Index LA diam:        3.60 cm 1.85 cm/m   RA Area:     15.00 cm LA Vol (A2C):   54.0 ml 27.78 ml/m  RA Volume:   37.10 ml  19.08 ml/m LA Vol (A4C):   47.2 ml 24.28 ml/m LA Biplane Vol: 51.2 ml 26.34 ml/m  AORTIC VALVE AV Area (Vmax):    2.83 cm AV Area (Vmean):   2.74 cm AV Area (VTI):     2.87 cm AV Vmax:           141.00 cm/s AV Vmean:          96.400 cm/s AV VTI:             0.290 m AV Peak Grad:      8.0 mmHg AV Mean Grad:      4.0 mmHg LVOT Vmax:         127.00 cm/s LVOT Vmean:        84.000 cm/s LVOT VTI:          0.265 m LVOT/AV VTI ratio: 0.91  AORTA Ao Root diam: 2.60 cm Ao Asc diam:  2.90 cm MITRAL VALVE                TRICUSPID VALVE MV Area (PHT): 3.77 cm     TR Peak grad:   19.9 mmHg MV Mean grad:  2.5 mmHg     TR Vmax:        223.00 cm/s MV Decel Time: 201 msec MV E velocity: 99.40 cm/s   SHUNTS MV A velocity: 129.00 cm/s  Systemic VTI:  0.26 m MV E/A ratio:  0.77         Systemic Diam: 2.00 cm Weston Brass MD Electronically signed by Weston Brass MD Signature Date/Time: 08/05/2023/2:02:51 PM    Final    MR BRAIN WO CONTRAST Result Date: 08/05/2023 CLINICAL DATA:  Follow-up examination for stroke. EXAM: MRI HEAD WITHOUT CONTRAST TECHNIQUE: Multiplanar, multiecho pulse sequences of the brain and surrounding structures were obtained without intravenous contrast. COMPARISON:  Prior CT from earlier the same day. FINDINGS: Brain: Cerebral volume within normal limits. Patchy T2/FLAIR hyperintensity involving the periventricular, deep, and subcortical white matter both cerebral hemispheres, most like related chronic microvascular ischemic disease, mild for age. No evidence for acute or subacute ischemia. Gray-white matter differentiation maintained. No acute or chronic intracranial blood products. No mass lesion, midline shift or mass effect. No hydrocephalus or extra-axial fluid collection. Pituitary gland and suprasellar region within normal limits. Vascular: Major intracranial vascular flow voids are maintained. Skull and upper cervical spine: Craniocervical junctional limits. Bone marrow signal intensity normal. No scalp soft tissue abnormality. Sinuses/Orbits: Globes and orbital soft tissues within normal limits. Mild mucosal thickening present about the ethmoidal air cells. Paranasal sinuses are otherwise clear. No mastoid effusion. Other: None. IMPRESSION: 1. No  acute intracranial abnormality. 2. Mild chronic microvascular ischemic disease for age. Electronically Signed   By: Rise Mu M.D.   On: 08/05/2023 01:11   DG CHEST PORT 1 VIEW Result Date: 08/04/2023 CLINICAL DATA:  Chest pain, dizziness, and nausea EXAM: PORTABLE CHEST  1 VIEW COMPARISON:  None Available. FINDINGS: Single frontal view of the chest demonstrates an unremarkable cardiac silhouette. Lung volumes are diminished, with crowding the central vasculature. No acute airspace disease, effusion, or pneumothorax. No acute bony abnormality. IMPRESSION: 1. Low lung volumes.  No acute airspace disease. Electronically Signed   By: Sharlet Salina M.D.   On: 08/04/2023 19:23   CT Angio Head Neck W WO CM Result Date: 08/04/2023 CLINICAL DATA:  Neuro deficit, stroke suspected. Dizziness. Patient feels like rim is pending. Nausea. EXAM: CT ANGIOGRAPHY HEAD AND NECK WITH AND WITHOUT CONTRAST TECHNIQUE: Multidetector CT imaging of the head and neck was performed using the standard protocol during bolus administration of intravenous contrast. Multiplanar CT image reconstructions and MIPs were obtained to evaluate the vascular anatomy. Carotid stenosis measurements (when applicable) are obtained utilizing NASCET criteria, using the distal internal carotid diameter as the denominator. RADIATION DOSE REDUCTION: This exam was performed according to the departmental dose-optimization program which includes automated exposure control, adjustment of the mA and/or kV according to patient size and/or use of iterative reconstruction technique. CONTRAST:  80mL OMNIPAQUE IOHEXOL 350 MG/ML SOLN COMPARISON:  None Available. FINDINGS: CTA NECK FINDINGS Aortic arch: Atherosclerotic calcifications are present in the distal aortic arch beyond the great vessel origins. No aneurysm or stenosis is present. Right carotid system: The right common carotid artery is within normal limits. Atherosclerotic changes are present at the  proximal right ICA without significant stenosis relative to the more distal vessel. Cervical right ICA is otherwise normal. Left carotid system: The left common carotid artery is within normal limits. Irregular noncalcified plaque is present proximal left ICA without a significant stenosis relative to the more distal vessel. The more distal cervical left ICA is normal. Vertebral arteries: The vertebral arteries are codominant. Both vertebral arteries originate from the subclavian arteries. No significant stenosis is present in either vertebral artery in the neck. The vertebral body heights and alignment are normal. Skeleton: Vertebral body heights and alignment are normal. No focal osseous lesions are present. Other neck: Soft tissues the neck are otherwise unremarkable. Salivary glands are within normal limits. Thyroid is normal. No significant adenopathy is present. No focal mucosal or submucosal lesions are present. Upper chest: A subpleural lipoma is noted at the right lung apex. Review of the MIP images confirms the above findings CTA HEAD FINDINGS Anterior circulation: The internal carotid arteries are within normal limits from the skull base to the ICA termini. The A1 and M1 segments are normal. MCA bifurcations are within normal limits. The ACA and MCA branch vessels are normal. No aneurysm is present. Posterior circulation: The vertebral arteries are codominant. Vertebrobasilar junction and basilar artery normal. The superior cerebellar arteries are patent bilaterally. The right posterior cerebral artery originates from basilar tip. The left posterior cerebral artery is of fetal type. PCA branch vessels are within normal limits bilaterally. No aneurysm is present. Venous sinuses: The dural sinuses are patent. The straight sinus and deep cerebral veins are intact. Cortical veins are within normal limits. No significant vascular malformation is evident. Anatomic variants: Fetal type left posterior cerebral  artery. Review of the MIP images confirms the above findings IMPRESSION: 1. No emergent large vessel occlusion. 2. Atherosclerotic changes at the proximal internal carotid arteries bilaterally without significant stenosis relative to the more distal vessels. 3. Normal variant CTA Circle of Willis without significant proximal stenosis, aneurysm, or branch vessel occlusion. 4.  Aortic Atherosclerosis (ICD10-I70.0). Electronically Signed   By: Virl Son.D.  On: 08/04/2023 19:18   CT HEAD CODE STROKE WO CONTRAST Result Date: 08/04/2023 CLINICAL DATA:  Code stroke. Neuro deficit, acute, stroke suspected. Patient feels the room is spinning. She is nauseated. EXAM: CT HEAD WITHOUT CONTRAST TECHNIQUE: Contiguous axial images were obtained from the base of the skull through the vertex without intravenous contrast. RADIATION DOSE REDUCTION: This exam was performed according to the departmental dose-optimization program which includes automated exposure control, adjustment of the mA and/or kV according to patient size and/or use of iterative reconstruction technique. COMPARISON:  None Available. FINDINGS: Brain: No acute infarct, hemorrhage, or mass lesion is present. No significant white matter lesions are present. Deep brain nuclei are within normal limits. The ventricles are of normal size. No significant extraaxial fluid collection is present. The brainstem and cerebellum are within normal limits. Midline structures are within normal limits. Vascular: No hyperdense vessel or unexpected calcification. Skull: No significant extracranial soft tissue lesion is present. Calvarium is intact. No focal lytic or blastic lesions are present. Hyperostosis is noted. Sinuses/Orbits: The paranasal sinuses and mastoid air cells are clear. The globes and orbits are within normal limits. ASPECTS [facility] Stroke Program Early CT Score) - Ganglionic level infarction (caudate, lentiform nuclei, internal capsule, insula,  M1-M3 cortex): 7/7 - Supraganglionic infarction (M4-M6 cortex): 3/3 Total score (0-10 with 10 being normal): 10/10 IMPRESSION: 1. Normal head CT. 2. Aspects is 10/10. The above was relayed via text pager to Dr. Roda Shutters on 08/04/2023 at 16:50 . Electronically Signed   By: Marin Roberts M.D.   On: 08/04/2023 16:52    Assessment & Plan:   Problem List Items Addressed This Visit     Hypothyroidism   Chronic - not taking Rx Monitor TSH      Relevant Orders   CBC with Differential/Platelet   Comprehensive metabolic panel   Lipid panel   T4, free   Vitamin B12   Dyslipidemia   Coronary calcium score of 0. This is a low risk study. On Krill oil      Relevant Orders   CBC with Differential/Platelet   Comprehensive metabolic panel   Lipid panel   T4, free   Vitamin B12   VITAMIN D 25 Hydroxy (Vit-D Deficiency, Fractures)   B12 deficiency   On B12      Relevant Orders   Vitamin B12   Vitamin D deficiency   Vit D3 and K2      Relevant Orders   VITAMIN D 25 Hydroxy (Vit-D Deficiency, Fractures)   Polycythemia   Check CBC      Relevant Orders   CBC with Differential/Platelet   Comprehensive metabolic panel   Lipid panel   T4, free   Vitamin B12   VITAMIN D 25 Hydroxy (Vit-D Deficiency, Fractures)   Fatty liver - Primary   Use Milk thistle       Cystitis   Recurrent Keflex prn         Meds ordered this encounter  Medications   cephALEXin (KEFLEX) 500 MG capsule    Sig: Use one QID x 5 days prn bladder infection    Dispense:  40 capsule    Refill:  1      Follow-up: Return in about 3 months (around 12/17/2023) for a follow-up visit.  Sonda Primes, MD

## 2023-09-18 NOTE — Assessment & Plan Note (Signed)
Use Milk thistle

## 2023-09-18 NOTE — Assessment & Plan Note (Signed)
Recurrent Keflex prn 

## 2023-09-18 NOTE — Assessment & Plan Note (Signed)
 Coronary calcium score of 0. This is a low risk study. On Krill oil

## 2023-09-18 NOTE — Assessment & Plan Note (Signed)
On B12 

## 2023-09-18 NOTE — Assessment & Plan Note (Signed)
Chronic - not taking Rx Monitor TSH

## 2023-12-11 ENCOUNTER — Other Ambulatory Visit (INDEPENDENT_AMBULATORY_CARE_PROVIDER_SITE_OTHER)

## 2023-12-11 DIAGNOSIS — I10 Essential (primary) hypertension: Secondary | ICD-10-CM | POA: Diagnosis not present

## 2023-12-11 DIAGNOSIS — E559 Vitamin D deficiency, unspecified: Secondary | ICD-10-CM | POA: Diagnosis not present

## 2023-12-11 DIAGNOSIS — E785 Hyperlipidemia, unspecified: Secondary | ICD-10-CM | POA: Diagnosis not present

## 2023-12-11 DIAGNOSIS — D751 Secondary polycythemia: Secondary | ICD-10-CM | POA: Diagnosis not present

## 2023-12-11 DIAGNOSIS — N3090 Cystitis, unspecified without hematuria: Secondary | ICD-10-CM

## 2023-12-11 DIAGNOSIS — R739 Hyperglycemia, unspecified: Secondary | ICD-10-CM

## 2023-12-11 DIAGNOSIS — E039 Hypothyroidism, unspecified: Secondary | ICD-10-CM

## 2023-12-11 DIAGNOSIS — E538 Deficiency of other specified B group vitamins: Secondary | ICD-10-CM

## 2023-12-11 LAB — CBC WITH DIFFERENTIAL/PLATELET
Basophils Absolute: 0.1 10*3/uL (ref 0.0–0.1)
Basophils Relative: 1 % (ref 0.0–3.0)
Eosinophils Absolute: 0.2 10*3/uL (ref 0.0–0.7)
Eosinophils Relative: 2.9 % (ref 0.0–5.0)
HCT: 44.7 % (ref 36.0–46.0)
Hemoglobin: 14.9 g/dL (ref 12.0–15.0)
Lymphocytes Relative: 32.1 % (ref 12.0–46.0)
Lymphs Abs: 1.8 10*3/uL (ref 0.7–4.0)
MCHC: 33.4 g/dL (ref 30.0–36.0)
MCV: 85.2 fl (ref 78.0–100.0)
Monocytes Absolute: 0.5 10*3/uL (ref 0.1–1.0)
Monocytes Relative: 8.3 % (ref 3.0–12.0)
Neutro Abs: 3.2 10*3/uL (ref 1.4–7.7)
Neutrophils Relative %: 55.7 % (ref 43.0–77.0)
Platelets: 226 10*3/uL (ref 150.0–400.0)
RBC: 5.24 Mil/uL — ABNORMAL HIGH (ref 3.87–5.11)
RDW: 13.1 % (ref 11.5–15.5)
WBC: 5.7 10*3/uL (ref 4.0–10.5)

## 2023-12-11 LAB — COMPREHENSIVE METABOLIC PANEL
ALT: 46 U/L — ABNORMAL HIGH (ref 0–35)
AST: 35 U/L (ref 0–37)
Albumin: 4 g/dL (ref 3.5–5.2)
Alkaline Phosphatase: 82 U/L (ref 39–117)
BUN: 18 mg/dL (ref 6–23)
CO2: 28 meq/L (ref 19–32)
Calcium: 9.3 mg/dL (ref 8.4–10.5)
Chloride: 105 meq/L (ref 96–112)
Creatinine, Ser: 0.98 mg/dL (ref 0.40–1.20)
GFR: 57.96 mL/min — ABNORMAL LOW (ref >60.00)
Glucose, Bld: 130 mg/dL — ABNORMAL HIGH (ref 70–99)
Potassium: 4 meq/L (ref 3.5–5.1)
Sodium: 139 meq/L (ref 135–145)
Total Bilirubin: 1.2 mg/dL (ref 0.2–1.2)
Total Protein: 7 g/dL (ref 6.0–8.3)

## 2023-12-11 LAB — LIPID PANEL
Cholesterol: 238 mg/dL — ABNORMAL HIGH (ref 0–200)
HDL: 46.8 mg/dL (ref >39.00)
LDL Cholesterol: 156 mg/dL — ABNORMAL HIGH (ref 0–99)
NonHDL: 190.71
Total CHOL/HDL Ratio: 5
Triglycerides: 175 mg/dL — ABNORMAL HIGH (ref 0.0–149.0)
VLDL: 35 mg/dL (ref 0.0–40.0)

## 2023-12-11 LAB — VITAMIN D 25 HYDROXY (VIT D DEFICIENCY, FRACTURES): VITD: 12.63 ng/mL — ABNORMAL LOW (ref 30.00–100.00)

## 2023-12-11 LAB — VITAMIN B12: Vitamin B-12: 296 pg/mL (ref 211–911)

## 2023-12-11 LAB — T4, FREE: Free T4: 0.76 ng/dL (ref 0.60–1.60)

## 2023-12-11 LAB — HEMOGLOBIN A1C: Hgb A1c MFr Bld: 6.7 % — ABNORMAL HIGH (ref 4.6–6.5)

## 2023-12-11 LAB — TSH: TSH: 2.48 u[IU]/mL (ref 0.35–5.50)

## 2023-12-13 ENCOUNTER — Encounter: Payer: Self-pay | Admitting: Internal Medicine

## 2023-12-13 ENCOUNTER — Ambulatory Visit: Payer: Medicare HMO | Admitting: Internal Medicine

## 2023-12-13 VITALS — BP 130/82 | HR 77 | Temp 97.6°F | Ht 65.0 in | Wt 197.4 lb

## 2023-12-13 DIAGNOSIS — E039 Hypothyroidism, unspecified: Secondary | ICD-10-CM

## 2023-12-13 DIAGNOSIS — I809 Phlebitis and thrombophlebitis of unspecified site: Secondary | ICD-10-CM | POA: Insufficient documentation

## 2023-12-13 DIAGNOSIS — I10 Essential (primary) hypertension: Secondary | ICD-10-CM

## 2023-12-13 DIAGNOSIS — G4709 Other insomnia: Secondary | ICD-10-CM | POA: Diagnosis not present

## 2023-12-13 DIAGNOSIS — E559 Vitamin D deficiency, unspecified: Secondary | ICD-10-CM | POA: Diagnosis not present

## 2023-12-13 DIAGNOSIS — E538 Deficiency of other specified B group vitamins: Secondary | ICD-10-CM | POA: Diagnosis not present

## 2023-12-13 MED ORDER — VITAMIN D (ERGOCALCIFEROL) 1.25 MG (50000 UNIT) PO CAPS: 50000.0000 [IU] | ORAL_CAPSULE | ORAL | 0 refills | Status: DC

## 2023-12-13 NOTE — Assessment & Plan Note (Signed)
We discussed compliance, wt loss On Exforge HCT

## 2023-12-13 NOTE — Assessment & Plan Note (Signed)
 R thenar clotted superficial vein 3 cm, bruise

## 2023-12-13 NOTE — Assessment & Plan Note (Signed)
 On B12

## 2023-12-13 NOTE — Progress Notes (Signed)
 Subjective:  Patient ID: Kayla Aguirre, female    DOB: October 24, 1950  Age: 73 y.o. MRN: [id number]  CC: Medical Management of Chronic Issues (3 Month Follow Up. Review last set of labs. Patient notes of swelling, bruising present on right hand (right beside thumb).)   HPI Kayla Aguirre presents for B Medical Management of Chronic Issues (3 Month Follow Up. Review last set of labs. Patient notes of swelling, bruising present on right hand (right beside thumb).)  Elevated glucose Outpatient Medications Prior to Visit  Medication Sig Dispense Refill  . amLODIPine-Valsartan-HCTZ (EXFORGE HCT) 10-160-12.5 MG TABS Take 1 tablet by mouth daily. 90 tablet 3  . aspirin EC 81 MG tablet Take 1 tablet (81 mg total) by mouth daily. Swallow whole. 150 tablet 0  . cephALEXin (KEFLEX) 500 MG capsule Use one QID x 5 days prn bladder infection 40 capsule 1  . diazepam (VALIUM) 5 MG tablet Take 1 tablet (5 mg total) by mouth every 8 (eight) hours as needed (severe vertigo). 30 tablet 1  . famotidine (PEPCID) 40 MG tablet Take 1 tablet (40 mg total) by mouth daily. 90 tablet 3  . meclizine (ANTIVERT) 25 MG tablet Take 1 tablet (25 mg total) by mouth 3 (three) times daily as needed for dizziness. 30 tablet 0  . ondansetron (ZOFRAN-ODT) 4 MG disintegrating tablet Take 1 tablet (4 mg total) by mouth every 8 (eight) hours as needed for nausea or vomiting. 20 tablet 1  . Cholecalciferol (VITAMIN D3) 50 MCG (2000 UT) capsule Take 1 capsule (2,000 Units total) by mouth daily. (Patient not taking: Reported on 08/04/2023) 100 capsule 3  . Cyanocobalamin (VITAMIN B-12) 1000 MCG SUBL Place 1 tablet (1,000 mcg total) under the tongue daily. (Patient not taking: Reported on 08/04/2023) 100 tablet 3  . MegaRed Omega-3 Krill Oil 500 MG CAPS Take 1 capsule by mouth every morning. (Patient not taking: Reported on 08/04/2023) 100 capsule 3  . triamcinolone cream (KENALOG) 0.1 % Apply 1 Application topically 2 (two) times  daily as needed. (Patient not taking: Reported on 09/18/2023) 80 g 3   No facility-administered medications prior to visit.    ROS: Review of Systems  Constitutional:  Negative for activity change, appetite change, chills, diaphoresis, fatigue, fever and unexpected weight change.  HENT:  Negative for congestion, dental problem, ear pain, hearing loss, mouth sores, postnasal drip, sinus pressure, sneezing, sore throat and voice change.   Eyes:  Negative for pain and visual disturbance.  Respiratory:  Negative for cough, chest tightness, shortness of breath, wheezing and stridor.   Cardiovascular:  Negative for chest pain, palpitations and leg swelling.  Gastrointestinal:  Negative for abdominal distention, abdominal pain, blood in stool, nausea, rectal pain and vomiting.  Genitourinary:  Negative for decreased urine volume, difficulty urinating, dysuria, frequency, hematuria, menstrual problem, vaginal bleeding, vaginal discharge and vaginal pain.  Musculoskeletal:  Negative for back pain, gait problem, joint swelling and neck pain.  Skin:  Negative for color change, rash and wound.  Neurological:  Negative for dizziness, tremors, syncope, speech difficulty, weakness and light-headedness.  Hematological:  Negative for adenopathy.  Psychiatric/Behavioral:  Negative for behavioral problems, confusion, decreased concentration, dysphoric mood, hallucinations, sleep disturbance and suicidal ideas. The patient is not nervous/anxious and is not hyperactive.     Objective:  BP 130/82   Pulse 77   Temp 97.6 F (36.4 C)   Ht 5\' 5"  (1.651 m)   Wt 197 lb 6.4 oz (89.5 kg)   SpO2 98%  BMI 32.85 kg/m   BP Readings from Last 3 Encounters:  12/13/23 130/82  09/18/23 118/70  08/15/23 122/70    Wt Readings from Last 3 Encounters:  12/13/23 197 lb 6.4 oz (89.5 kg)  09/18/23 192 lb (87.1 kg)  08/05/23 196 lb 6.9 oz (89.1 kg)    Physical Exam Constitutional:      General: She is not in acute  distress.    Appearance: She is well-developed.  HENT:     Head: Normocephalic.     Right Ear: External ear normal.     Left Ear: External ear normal.     Nose: Nose normal.  Eyes:     General:        Right eye: No discharge.        Left eye: No discharge.     Conjunctiva/sclera: Conjunctivae normal.     Pupils: Pupils are equal, round, and reactive to light.  Neck:     Thyroid: No thyromegaly.     Vascular: No JVD.     Trachea: No tracheal deviation.  Cardiovascular:     Rate and Rhythm: Normal rate and regular rhythm.     Heart sounds: Normal heart sounds.  Pulmonary:     Effort: No respiratory distress.     Breath sounds: No stridor. No wheezing.  Abdominal:     General: Bowel sounds are normal. There is no distension.     Palpations: Abdomen is soft. There is no mass.     Tenderness: There is no abdominal tenderness. There is no guarding or rebound.  Musculoskeletal:        General: No tenderness.     Cervical back: Normal range of motion and neck supple. No rigidity.     Right lower leg: No edema.     Left lower leg: No edema.  Lymphadenopathy:     Cervical: No cervical adenopathy.  Skin:    Findings: No erythema or rash.  Neurological:     Cranial Nerves: No cranial nerve deficit.     Motor: No abnormal muscle tone.     Coordination: Coordination normal.     Deep Tendon Reflexes: Reflexes normal.  Psychiatric:        Behavior: Behavior normal.        Thought Content: Thought content normal.        Judgment: Judgment normal.   R thenar clotted superficial vein 3 cm, bruise Lab Results  Component Value Date   WBC 5.7 12/11/2023   HGB 14.9 12/11/2023   HCT 44.7 12/11/2023   PLT 226.0 12/11/2023   GLUCOSE 130 (H) 12/11/2023   CHOL 238 (H) 12/11/2023   TRIG 175.0 (H) 12/11/2023   HDL 46.80 12/11/2023   LDLDIRECT 169.0 03/17/2023   LDLCALC 156 (H) 12/11/2023   ALT 46 (H) 12/11/2023   AST 35 12/11/2023   NA 139 12/11/2023   K 4.0 12/11/2023   CL 105  12/11/2023   CREATININE 0.98 12/11/2023   BUN 18 12/11/2023   CO2 28 12/11/2023   TSH 2.48 12/11/2023   INR 1.0 08/05/2023   HGBA1C 6.7 (H) 12/11/2023    ECHOCARDIOGRAM COMPLETE Result Date: 08/05/2023    ECHOCARDIOGRAM REPORT   Patient Name:   Kayla Aguirre RATHBUN [facility] Date of Exam: 08/05/2023 Medical Rec #:  [id number]         Height:       65.0 in Accession #:    [id number]        Weight:  192.0 lb Date of Birth:  08/19/1951         BSA:          1.944 m Patient Age:    72 years          BP:           150/82 mmHg Patient Gender: F                 HR:           81 bpm. Exam Location:  Inpatient Procedure: 2D Echo, Cardiac Doppler, Color Doppler and Strain Analysis Indications:    TIA  History:        Patient has no prior history of Echocardiogram examinations.                 Risk Factors:Hypertension.  Sonographer:    Karma Ganja Referring Phys: [id number] [facility] GOEL  Sonographer Comments: Global longitudinal strain was attempted. IMPRESSIONS  1. Left ventricular ejection fraction, by estimation, is 70 to 75%. The left ventricle has hyperdynamic function. The left ventricle has no regional wall motion abnormalities. There is mild left ventricular hypertrophy. Left ventricular diastolic parameters are consistent with Grade I diastolic dysfunction (impaired relaxation).  2. Right ventricular systolic function is normal. The right ventricular size is normal. There is normal pulmonary artery systolic pressure. The estimated right ventricular systolic pressure is 22.9 mmHg.  3. The mitral valve is normal in structure. Trivial mitral valve regurgitation. No evidence of mitral stenosis.  4. The aortic valve is grossly normal. Aortic valve regurgitation is not visualized. No aortic stenosis is present.  5. The inferior vena cava is normal in size with greater than 50% respiratory variability, suggesting right atrial pressure of 3 mmHg. FINDINGS  Left Ventricle: Left ventricular ejection fraction, by estimation, is 70  to 75%. The left ventricle has hyperdynamic function. The left ventricle has no regional wall motion abnormalities. Global longitudinal strain performed but not reported based on interpreter judgement due to suboptimal tracking. The left ventricular internal cavity size was normal in size. There is mild left ventricular hypertrophy. Left ventricular diastolic parameters are consistent with Grade I diastolic dysfunction (impaired relaxation). Right Ventricle: The right ventricular size is normal. No increase in right ventricular wall thickness. Right ventricular systolic function is normal. There is normal pulmonary artery systolic pressure. The tricuspid regurgitant velocity is 2.23 m/s, and  with an assumed right atrial pressure of 3 mmHg, the estimated right ventricular systolic pressure is 22.9 mmHg. Left Atrium: Left atrial size was normal in size. Right Atrium: Right atrial size was normal in size. Pericardium: There is no evidence of pericardial effusion. Presence of epicardial fat layer. Mitral Valve: The mitral valve is normal in structure. Trivial mitral valve regurgitation. No evidence of mitral valve stenosis. The mean mitral valve gradient is 2.5 mmHg. Tricuspid Valve: The tricuspid valve is normal in structure. Tricuspid valve regurgitation is trivial. No evidence of tricuspid stenosis. Aortic Valve: The aortic valve is grossly normal. Aortic valve regurgitation is not visualized. No aortic stenosis is present. Aortic valve mean gradient measures 4.0 mmHg. Aortic valve peak gradient measures 8.0 mmHg. Aortic valve area, by VTI measures 2.87 cm. Pulmonic Valve: The pulmonic valve was normal in structure. Pulmonic valve regurgitation is not visualized. No evidence of pulmonic stenosis. Aorta: The aortic root is normal in size and structure. Venous: The inferior vena cava is normal in size with greater than 50% respiratory variability, suggesting right atrial pressure of 3 mmHg. IAS/Shunts: No  atrial  level shunt detected by color flow Doppler.  LEFT VENTRICLE PLAX 2D LVIDd:         4.30 cm   Diastology LVIDs:         2.20 cm   LV e' medial:    5.87 cm/s LV PW:         1.10 cm   LV E/e' medial:  16.9 LV IVS:        1.00 cm   LV e' lateral:   9.90 cm/s LVOT diam:     2.00 cm   LV E/e' lateral: 10.0 LV SV:         83 LV SV Index:   43 LVOT Area:     3.14 cm  RIGHT VENTRICLE             IVC RV Basal diam:  3.30 cm     IVC diam: 1.50 cm RV S prime:     14.70 cm/s TAPSE (M-mode): 2.4 cm LEFT ATRIUM             Index        RIGHT ATRIUM           Index LA diam:        3.60 cm 1.85 cm/m   RA Area:     15.00 cm LA Vol (A2C):   54.0 ml 27.78 ml/m  RA Volume:   37.10 ml  19.08 ml/m LA Vol (A4C):   47.2 ml 24.28 ml/m LA Biplane Vol: 51.2 ml 26.34 ml/m  AORTIC VALVE AV Area (Vmax):    2.83 cm AV Area (Vmean):   2.74 cm AV Area (VTI):     2.87 cm AV Vmax:           141.00 cm/s AV Vmean:          96.400 cm/s AV VTI:            0.290 m AV Peak Grad:      8.0 mmHg AV Mean Grad:      4.0 mmHg LVOT Vmax:         127.00 cm/s LVOT Vmean:        84.000 cm/s LVOT VTI:          0.265 m LVOT/AV VTI ratio: 0.91  AORTA Ao Root diam: 2.60 cm Ao Asc diam:  2.90 cm MITRAL VALVE                TRICUSPID VALVE MV Area (PHT): 3.77 cm     TR Peak grad:   19.9 mmHg MV Mean grad:  2.5 mmHg     TR Vmax:        223.00 cm/s MV Decel Time: 201 msec MV E velocity: 99.40 cm/s   SHUNTS MV A velocity: 129.00 cm/s  Systemic VTI:  0.26 m MV E/A ratio:  0.77         Systemic Diam: 2.00 cm Weston Brass MD Electronically signed by Weston Brass MD Signature Date/Time: 08/05/2023/2:02:51 PM    Final    MR BRAIN WO CONTRAST Result Date: 08/05/2023 CLINICAL DATA:  Follow-up examination for stroke. EXAM: MRI HEAD WITHOUT CONTRAST TECHNIQUE: Multiplanar, multiecho pulse sequences of the brain and surrounding structures were obtained without intravenous contrast. COMPARISON:  Prior CT from earlier the same day. FINDINGS: Brain: Cerebral volume  within normal limits. Patchy T2/FLAIR hyperintensity involving the periventricular, deep, and subcortical white matter both cerebral hemispheres, most like related chronic microvascular ischemic disease, mild for age. No evidence  for acute or subacute ischemia. Gray-white matter differentiation maintained. No acute or chronic intracranial blood products. No mass lesion, midline shift or mass effect. No hydrocephalus or extra-axial fluid collection. Pituitary gland and suprasellar region within normal limits. Vascular: Major intracranial vascular flow voids are maintained. Skull and upper cervical spine: Craniocervical junctional limits. Bone marrow signal intensity normal. No scalp soft tissue abnormality. Sinuses/Orbits: Globes and orbital soft tissues within normal limits. Mild mucosal thickening present about the ethmoidal air cells. Paranasal sinuses are otherwise clear. No mastoid effusion. Other: None. IMPRESSION: 1. No acute intracranial abnormality. 2. Mild chronic microvascular ischemic disease for age. Electronically Signed   By: Rise Mu M.D.   On: 08/05/2023 01:11   DG CHEST PORT 1 VIEW Result Date: 08/04/2023 CLINICAL DATA:  Chest pain, dizziness, and nausea EXAM: PORTABLE CHEST 1 VIEW COMPARISON:  None Available. FINDINGS: Single frontal view of the chest demonstrates an unremarkable cardiac silhouette. Lung volumes are diminished, with crowding the central vasculature. No acute airspace disease, effusion, or pneumothorax. No acute bony abnormality. IMPRESSION: 1. Low lung volumes.  No acute airspace disease. Electronically Signed   By: Sharlet Salina M.D.   On: 08/04/2023 19:23   CT Angio Head Neck W WO CM Result Date: 08/04/2023 CLINICAL DATA:  Neuro deficit, stroke suspected. Dizziness. Patient feels like rim is pending. Nausea. EXAM: CT ANGIOGRAPHY HEAD AND NECK WITH AND WITHOUT CONTRAST TECHNIQUE: Multidetector CT imaging of the head and neck was performed using the standard  protocol during bolus administration of intravenous contrast. Multiplanar CT image reconstructions and MIPs were obtained to evaluate the vascular anatomy. Carotid stenosis measurements (when applicable) are obtained utilizing NASCET criteria, using the distal internal carotid diameter as the denominator. RADIATION DOSE REDUCTION: This exam was performed according to the departmental dose-optimization program which includes automated exposure control, adjustment of the mA and/or kV according to patient size and/or use of iterative reconstruction technique. CONTRAST:  80mL OMNIPAQUE IOHEXOL 350 MG/ML SOLN COMPARISON:  None Available. FINDINGS: CTA NECK FINDINGS Aortic arch: Atherosclerotic calcifications are present in the distal aortic arch beyond the great vessel origins. No aneurysm or stenosis is present. Right carotid system: The right common carotid artery is within normal limits. Atherosclerotic changes are present at the proximal right ICA without significant stenosis relative to the more distal vessel. Cervical right ICA is otherwise normal. Left carotid system: The left common carotid artery is within normal limits. Irregular noncalcified plaque is present proximal left ICA without a significant stenosis relative to the more distal vessel. The more distal cervical left ICA is normal. Vertebral arteries: The vertebral arteries are codominant. Both vertebral arteries originate from the subclavian arteries. No significant stenosis is present in either vertebral artery in the neck. The vertebral body heights and alignment are normal. Skeleton: Vertebral body heights and alignment are normal. No focal osseous lesions are present. Other neck: Soft tissues the neck are otherwise unremarkable. Salivary glands are within normal limits. Thyroid is normal. No significant adenopathy is present. No focal mucosal or submucosal lesions are present. Upper chest: A subpleural lipoma is noted at the right lung apex. Review  of the MIP images confirms the above findings CTA HEAD FINDINGS Anterior circulation: The internal carotid arteries are within normal limits from the skull base to the ICA termini. The A1 and M1 segments are normal. MCA bifurcations are within normal limits. The ACA and MCA branch vessels are normal. No aneurysm is present. Posterior circulation: The vertebral arteries are codominant. Vertebrobasilar junction and basilar artery  normal. The superior cerebellar arteries are patent bilaterally. The right posterior cerebral artery originates from basilar tip. The left posterior cerebral artery is of fetal type. PCA branch vessels are within normal limits bilaterally. No aneurysm is present. Venous sinuses: The dural sinuses are patent. The straight sinus and deep cerebral veins are intact. Cortical veins are within normal limits. No significant vascular malformation is evident. Anatomic variants: Fetal type left posterior cerebral artery. Review of the MIP images confirms the above findings IMPRESSION: 1. No emergent large vessel occlusion. 2. Atherosclerotic changes at the proximal internal carotid arteries bilaterally without significant stenosis relative to the more distal vessels. 3. Normal variant CTA Circle of Willis without significant proximal stenosis, aneurysm, or branch vessel occlusion. 4.  Aortic Atherosclerosis (ICD10-I70.0). Electronically Signed   By: Marin Roberts M.D.   On: 08/04/2023 19:18   CT HEAD CODE STROKE WO CONTRAST Result Date: 08/04/2023 CLINICAL DATA:  Code stroke. Neuro deficit, acute, stroke suspected. Patient feels the room is spinning. She is nauseated. EXAM: CT HEAD WITHOUT CONTRAST TECHNIQUE: Contiguous axial images were obtained from the base of the skull through the vertex without intravenous contrast. RADIATION DOSE REDUCTION: This exam was performed according to the departmental dose-optimization program which includes automated exposure control, adjustment of the mA  and/or kV according to patient size and/or use of iterative reconstruction technique. COMPARISON:  None Available. FINDINGS: Brain: No acute infarct, hemorrhage, or mass lesion is present. No significant white matter lesions are present. Deep brain nuclei are within normal limits. The ventricles are of normal size. No significant extraaxial fluid collection is present. The brainstem and cerebellum are within normal limits. Midline structures are within normal limits. Vascular: No hyperdense vessel or unexpected calcification. Skull: No significant extracranial soft tissue lesion is present. Calvarium is intact. No focal lytic or blastic lesions are present. Hyperostosis is noted. Sinuses/Orbits: The paranasal sinuses and mastoid air cells are clear. The globes and orbits are within normal limits. ASPECTS [facility] Stroke Program Early CT Score) - Ganglionic level infarction (caudate, lentiform nuclei, internal capsule, insula, M1-M3 cortex): 7/7 - Supraganglionic infarction (M4-M6 cortex): 3/3 Total score (0-10 with 10 being normal): 10/10 IMPRESSION: 1. Normal head CT. 2. Aspects is 10/10. The above was relayed via text pager to Dr. Roda Shutters on 08/04/2023 at 16:50 . Electronically Signed   By: Marin Roberts M.D.   On: 08/04/2023 16:52    Assessment & Plan:   Problem List Items Addressed This Visit     Hypothyroidism   Chronic - not taking Rx Monitor TSH      HTN (hypertension) - Primary   We discussed compliance, wt loss On Exforge HCT      B12 deficiency   On B12      Vitamin D deficiency   Vit D3 and K2      Insomnia   Vit D      Superficial phlebitis   R thenar clotted superficial vein 3 cm, bruise         Meds ordered this encounter  Medications  . Vitamin D, Ergocalciferol, (DRISDOL) 1.25 MG (50000 UNIT) CAPS capsule    Sig: Take 1 capsule (50,000 Units total) by mouth every 7 (seven) days.    Dispense:  8 capsule    Refill:  0      Follow-up: Return in about 3  months (around 03/14/2024) for a follow-up visit.  Sonda Primes, MD

## 2023-12-13 NOTE — Assessment & Plan Note (Signed)
Vit D3 and K2 

## 2023-12-13 NOTE — Assessment & Plan Note (Signed)
Chronic - not taking Rx Monitor TSH

## 2023-12-13 NOTE — Patient Instructions (Addendum)
 USEFUL THINGS FOR ARTHRITIS and musculoskeletal pains:    A "rice sock heating pad" refers to a homemade heating pad created by filling a sock with uncooked rice, which can be heated in a microwave to provide a warm compress for sore muscles, pain relief, or other applications; essentially, it's a simple way to generate heat using readily available materials.  Key points about rice sock heat: How to make it: Fill a clean sock (preferably a tube sock) about 2/3 full with uncooked rice, tie a knot at the top to secure the rice inside.  Heating it up: Place the rice sock in the microwave and heat in short intervals (usually around 30 seconds at a time) until it reaches the desired warmth.  Important considerations: Check temperature before applying: Always test the temperature of the rice sock before applying it to your skin to avoid burns.  Use a towel to protect skin: Wrap the rice sock in a thin towel to distribute the heat evenly and protect your skin.  Uses: Muscle aches and pains  Menstrual cramps  Neck pain  Arthritis discomfort   SILICONE PADS: Use them to open jars and bottles    BRIX JAR OPENER: Use them to open jars    NITRILE COATED GARDEN GLOVES: Use down to open jars, lift boxes, etc.   THUMB BRACE: Use it for thumb arthritis flareup pain     BLUE EMU CREAM: Use it 2-3 times a day on painful areas     Aspirin 235 mg a day

## 2023-12-13 NOTE — Assessment & Plan Note (Signed)
 Vit D

## 2024-02-09 ENCOUNTER — Other Ambulatory Visit: Payer: Self-pay | Admitting: Internal Medicine

## 2024-02-09 DIAGNOSIS — M25579 Pain in unspecified ankle and joints of unspecified foot: Secondary | ICD-10-CM | POA: Insufficient documentation

## 2024-02-09 DIAGNOSIS — S93491A Sprain of other ligament of right ankle, initial encounter: Secondary | ICD-10-CM | POA: Diagnosis not present

## 2024-03-05 DIAGNOSIS — M79671 Pain in right foot: Secondary | ICD-10-CM | POA: Diagnosis not present

## 2024-03-05 DIAGNOSIS — M25571 Pain in right ankle and joints of right foot: Secondary | ICD-10-CM | POA: Diagnosis not present

## 2024-03-09 ENCOUNTER — Other Ambulatory Visit: Payer: Self-pay | Admitting: Internal Medicine

## 2024-03-14 ENCOUNTER — Encounter: Payer: Self-pay | Admitting: Internal Medicine

## 2024-03-14 ENCOUNTER — Ambulatory Visit (INDEPENDENT_AMBULATORY_CARE_PROVIDER_SITE_OTHER): Admitting: Internal Medicine

## 2024-03-14 VITALS — BP 120/70 | HR 89 | Temp 98.2°F | Ht 65.0 in | Wt 193.0 lb

## 2024-03-14 DIAGNOSIS — E538 Deficiency of other specified B group vitamins: Secondary | ICD-10-CM | POA: Diagnosis not present

## 2024-03-14 DIAGNOSIS — S93401S Sprain of unspecified ligament of right ankle, sequela: Secondary | ICD-10-CM | POA: Diagnosis not present

## 2024-03-14 DIAGNOSIS — E559 Vitamin D deficiency, unspecified: Secondary | ICD-10-CM

## 2024-03-14 MED ORDER — MELOXICAM 15 MG PO TABS: 15.0000 mg | ORAL_TABLET | Freq: Every day | ORAL | 1 refills | Status: DC

## 2024-03-14 NOTE — Patient Instructions (Addendum)
 USEFUL THINGS FOR ARTHRITIS and musculoskeletal pains:    A rice sock heating pad refers to a homemade heating pad created by filling a sock with uncooked rice, which can be heated in a microwave to provide a warm compress for sore muscles, pain relief, or other applications; essentially, it's a simple way to generate heat using readily available materials.  Key points about rice sock heat: How to make it: Fill a clean sock (preferably a tube sock) about 2/3 full with uncooked rice, tie a knot at the top to secure the rice inside.  Heating it up: Place the rice sock in the microwave and heat in short intervals (usually around 30 seconds at a time) until it reaches the desired warmth.  Important considerations: Check temperature before applying: Always test the temperature of the rice sock before applying it to your skin to avoid burns.  Use a towel to protect skin: Wrap the rice sock in a thin towel to distribute the heat evenly and protect your skin.  Uses: Muscle aches and pains  Menstrual cramps  Neck pain  Arthritis discomfort    BLUE EMU CREAM: Use it 2-3 times a day on painful areas    Arnica cream  Elastic ankle brace/sleeve

## 2024-03-14 NOTE — Progress Notes (Signed)
 6  Subjective:  Patient ID: Kayla Aguirre, female    DOB: October 26, 1950  Age: 73 y.o. MRN: [id number]  CC: Medical Management of Chronic Issues (3 mnth f/u)   HPI Kayla Aguirre presents for R ankle injury 40 days ago.  It is still painful, swollen and discolored.  She has been limping.  She has been seeing at [facility] X-rays are negative for fracture Follow-up on hypertension, B12 deficiency, vitamin D  deficiency  Outpatient Medications Prior to Visit  Medication Sig Dispense Refill   amLODIPine -Valsartan -HCTZ (EXFORGE  HCT) 10-160-12.5 MG TABS Take 1 tablet by mouth daily. 90 tablet 3   aspirin  EC 81 MG tablet Take 1 tablet (81 mg total) by mouth daily. Swallow whole. 150 tablet 0   cephALEXin  (KEFLEX ) 500 MG capsule Use one QID x 5 days prn bladder infection 40 capsule 1   diazepam  (VALIUM ) 5 MG tablet Take 1 tablet (5 mg total) by mouth every 8 (eight) hours as needed (severe vertigo). 30 tablet 1   famotidine  (PEPCID ) 40 MG tablet TAKE 1 TABLET BY MOUTH EVERY DAY 90 tablet 3   meclizine  (ANTIVERT ) 25 MG tablet Take 1 tablet (25 mg total) by mouth 3 (three) times daily as needed for dizziness. 30 tablet 0   ondansetron  (ZOFRAN -ODT) 4 MG disintegrating tablet Take 1 tablet (4 mg total) by mouth every 8 (eight) hours as needed for nausea or vomiting. 20 tablet 1   Vitamin D , Ergocalciferol , (DRISDOL ) 1.25 MG (50000 UNIT) CAPS capsule TAKE 1 CAPSULE (50,000 UNITS TOTAL) BY MOUTH EVERY 7 (SEVEN) DAYS 8 capsule 0   Cholecalciferol (VITAMIN D3) 50 MCG (2000 UT) capsule Take 1 capsule (2,000 Units total) by mouth daily. (Patient not taking: Reported on 03/14/2024) 100 capsule 3   Cyanocobalamin  (VITAMIN B-12) 1000 MCG SUBL Place 1 tablet (1,000 mcg total) under the tongue daily. (Patient not taking: Reported on 03/14/2024) 100 tablet 3   MegaRed Omega-3 Krill Oil 500 MG CAPS Take 1 capsule by mouth every morning. (Patient not taking: Reported on 03/14/2024) 100 capsule 3   triamcinolone  cream  (KENALOG ) 0.1 % Apply 1 Application topically 2 (two) times daily as needed. (Patient not taking: Reported on 03/14/2024) 80 g 3   No facility-administered medications prior to visit.    ROS: Review of Systems  Constitutional:  Positive for fatigue. Negative for activity change, appetite change, chills and unexpected weight change.  HENT:  Negative for congestion, mouth sores and sinus pressure.   Eyes:  Negative for visual disturbance.  Respiratory:  Negative for cough and chest tightness.   Gastrointestinal:  Negative for abdominal pain and nausea.  Genitourinary:  Negative for difficulty urinating, frequency and vaginal pain.  Musculoskeletal:  Positive for arthralgias, gait problem and joint swelling. Negative for back pain.  Skin:  Positive for color change. Negative for pallor and rash.  Neurological:  Negative for dizziness, tremors, weakness, numbness and headaches.  Psychiatric/Behavioral:  Negative for confusion and sleep disturbance.     Objective:  BP 120/70   Pulse 89   Temp 98.2 F (36.8 C) (Oral)   Ht 5' 5 (1.651 m)   Wt 193 lb (87.5 kg)   SpO2 97%   BMI 32.12 kg/m   BP Readings from Last 3 Encounters:  03/14/24 120/70  12/13/23 130/82  09/18/23 118/70    Wt Readings from Last 3 Encounters:  03/14/24 193 lb (87.5 kg)  12/13/23 197 lb 6.4 oz (89.5 kg)  09/18/23 192 lb (87.1 kg)    Physical Exam  Constitutional:      General: She is not in acute distress.    Appearance: She is well-developed.  HENT:     Head: Normocephalic.     Right Ear: External ear normal.     Left Ear: External ear normal.     Nose: Nose normal.   Eyes:     General:        Right eye: No discharge.        Left eye: No discharge.     Conjunctiva/sclera: Conjunctivae normal.     Pupils: Pupils are equal, round, and reactive to light.   Neck:     Thyroid : No thyromegaly.     Vascular: No JVD.     Trachea: No tracheal deviation.   Cardiovascular:     Rate and Rhythm: Normal  rate and regular rhythm.     Heart sounds: Normal heart sounds.  Pulmonary:     Effort: No respiratory distress.     Breath sounds: No stridor. No wheezing.  Abdominal:     General: Bowel sounds are normal. There is no distension.     Palpations: Abdomen is soft. There is no mass.     Tenderness: There is no abdominal tenderness. There is no guarding or rebound.   Musculoskeletal:        General: Swelling, tenderness, deformity and signs of injury present.     Cervical back: Normal range of motion and neck supple. No rigidity.     Right lower leg: Edema present.     Left lower leg: No edema.  Lymphadenopathy:     Cervical: No cervical adenopathy.   Skin:    Findings: Erythema present. No rash.   Neurological:     Cranial Nerves: No cranial nerve deficit.     Motor: No abnormal muscle tone.     Coordination: Coordination normal.     Deep Tendon Reflexes: Reflexes normal.   Psychiatric:        Behavior: Behavior normal.        Thought Content: Thought content normal.        Judgment: Judgment normal.   Right ankle with slight erythema, scaly skin, primarily lateral edema, decreased and painful range of motion  Lab Results  Component Value Date   WBC 5.7 12/11/2023   HGB 14.9 12/11/2023   HCT 44.7 12/11/2023   PLT 226.0 12/11/2023   GLUCOSE 130 (H) 12/11/2023   CHOL 238 (H) 12/11/2023   TRIG 175.0 (H) 12/11/2023   HDL 46.80 12/11/2023   LDLDIRECT 169.0 03/17/2023   LDLCALC 156 (H) 12/11/2023   ALT 46 (H) 12/11/2023   AST 35 12/11/2023   NA 139 12/11/2023   K 4.0 12/11/2023   CL 105 12/11/2023   CREATININE 0.98 12/11/2023   BUN 18 12/11/2023   CO2 28 12/11/2023   TSH 2.48 12/11/2023   INR 1.0 08/05/2023   HGBA1C 6.7 (H) 12/11/2023    ECHOCARDIOGRAM COMPLETE Result Date: 08/05/2023    ECHOCARDIOGRAM REPORT   Patient Name:   Kayla Aguirre [facility] Date of Exam: 08/05/2023 Medical Rec #:  [id number]         Height:       65.0 in Accession #:    [id number]         Weight:       192.0 lb Date of Birth:  10-26-1950         BSA:          1.944 m Patient Age:    59  years          BP:           150/82 mmHg Patient Gender: F                 HR:           81 bpm. Exam Location:  Inpatient Procedure: 2D Echo, Cardiac Doppler, Color Doppler and Strain Analysis Indications:    TIA  History:        Patient has no prior history of Echocardiogram examinations.                 Risk Factors:Hypertension.  Sonographer:    Ozell Free Referring Phys: [id number] [facility] GOEL  Sonographer Comments: Global longitudinal strain was attempted. IMPRESSIONS  1. Left ventricular ejection fraction, by estimation, is 70 to 75%. The left ventricle has hyperdynamic function. The left ventricle has no regional wall motion abnormalities. There is mild left ventricular hypertrophy. Left ventricular diastolic parameters are consistent with Grade I diastolic dysfunction (impaired relaxation).  2. Right ventricular systolic function is normal. The right ventricular size is normal. There is normal pulmonary artery systolic pressure. The estimated right ventricular systolic pressure is 22.9 mmHg.  3. The mitral valve is normal in structure. Trivial mitral valve regurgitation. No evidence of mitral stenosis.  4. The aortic valve is grossly normal. Aortic valve regurgitation is not visualized. No aortic stenosis is present.  5. The inferior vena cava is normal in size with greater than 50% respiratory variability, suggesting right atrial pressure of 3 mmHg. FINDINGS  Left Ventricle: Left ventricular ejection fraction, by estimation, is 70 to 75%. The left ventricle has hyperdynamic function. The left ventricle has no regional wall motion abnormalities. Global longitudinal strain performed but not reported based on interpreter judgement due to suboptimal tracking. The left ventricular internal cavity size was normal in size. There is mild left ventricular hypertrophy. Left ventricular diastolic parameters are consistent  with Grade I diastolic dysfunction (impaired relaxation). Right Ventricle: The right ventricular size is normal. No increase in right ventricular wall thickness. Right ventricular systolic function is normal. There is normal pulmonary artery systolic pressure. The tricuspid regurgitant velocity is 2.23 m/s, and  with an assumed right atrial pressure of 3 mmHg, the estimated right ventricular systolic pressure is 22.9 mmHg. Left Atrium: Left atrial size was normal in size. Right Atrium: Right atrial size was normal in size. Pericardium: There is no evidence of pericardial effusion. Presence of epicardial fat layer. Mitral Valve: The mitral valve is normal in structure. Trivial mitral valve regurgitation. No evidence of mitral valve stenosis. The mean mitral valve gradient is 2.5 mmHg. Tricuspid Valve: The tricuspid valve is normal in structure. Tricuspid valve regurgitation is trivial. No evidence of tricuspid stenosis. Aortic Valve: The aortic valve is grossly normal. Aortic valve regurgitation is not visualized. No aortic stenosis is present. Aortic valve mean gradient measures 4.0 mmHg. Aortic valve peak gradient measures 8.0 mmHg. Aortic valve area, by VTI measures 2.87 cm. Pulmonic Valve: The pulmonic valve was normal in structure. Pulmonic valve regurgitation is not visualized. No evidence of pulmonic stenosis. Aorta: The aortic root is normal in size and structure. Venous: The inferior vena cava is normal in size with greater than 50% respiratory variability, suggesting right atrial pressure of 3 mmHg. IAS/Shunts: No atrial level shunt detected by color flow Doppler.  LEFT VENTRICLE PLAX 2D LVIDd:         4.30 cm   Diastology LVIDs:  2.20 cm   LV e' medial:    5.87 cm/s LV PW:         1.10 cm   LV E/e' medial:  16.9 LV IVS:        1.00 cm   LV e' lateral:   9.90 cm/s LVOT diam:     2.00 cm   LV E/e' lateral: 10.0 LV SV:         83 LV SV Index:   43 LVOT Area:     3.14 cm  RIGHT VENTRICLE              IVC RV Basal diam:  3.30 cm     IVC diam: 1.50 cm RV S prime:     14.70 cm/s TAPSE (M-mode): 2.4 cm LEFT ATRIUM             Index        RIGHT ATRIUM           Index LA diam:        3.60 cm 1.85 cm/m   RA Area:     15.00 cm LA Vol (A2C):   54.0 ml 27.78 ml/m  RA Volume:   37.10 ml  19.08 ml/m LA Vol (A4C):   47.2 ml 24.28 ml/m LA Biplane Vol: 51.2 ml 26.34 ml/m  AORTIC VALVE AV Area (Vmax):    2.83 cm AV Area (Vmean):   2.74 cm AV Area (VTI):     2.87 cm AV Vmax:           141.00 cm/s AV Vmean:          96.400 cm/s AV VTI:            0.290 m AV Peak Grad:      8.0 mmHg AV Mean Grad:      4.0 mmHg LVOT Vmax:         127.00 cm/s LVOT Vmean:        84.000 cm/s LVOT VTI:          0.265 m LVOT/AV VTI ratio: 0.91  AORTA Ao Root diam: 2.60 cm Ao Asc diam:  2.90 cm MITRAL VALVE                TRICUSPID VALVE MV Area (PHT): 3.77 cm     TR Peak grad:   19.9 mmHg MV Mean grad:  2.5 mmHg     TR Vmax:        223.00 cm/s MV Decel Time: 201 msec MV E velocity: 99.40 cm/s   SHUNTS MV A velocity: 129.00 cm/s  Systemic VTI:  0.26 m MV E/A ratio:  0.77         Systemic Diam: 2.00 cm Soyla Merck MD Electronically signed by Soyla Merck MD Signature Date/Time: 08/05/2023/2:02:51 PM    Final    MR BRAIN WO CONTRAST Result Date: 08/05/2023 CLINICAL DATA:  Follow-up examination for stroke. EXAM: MRI HEAD WITHOUT CONTRAST TECHNIQUE: Multiplanar, multiecho pulse sequences of the brain and surrounding structures were obtained without intravenous contrast. COMPARISON:  Prior CT from earlier the same day. FINDINGS: Brain: Cerebral volume within normal limits. Patchy T2/FLAIR hyperintensity involving the periventricular, deep, and subcortical white matter both cerebral hemispheres, most like related chronic microvascular ischemic disease, mild for age. No evidence for acute or subacute ischemia. Gray-white matter differentiation maintained. No acute or chronic intracranial blood products. No mass lesion, midline shift or  mass effect. No hydrocephalus or extra-axial fluid collection. Pituitary gland and suprasellar region within  normal limits. Vascular: Major intracranial vascular flow voids are maintained. Skull and upper cervical spine: Craniocervical junctional limits. Bone marrow signal intensity normal. No scalp soft tissue abnormality. Sinuses/Orbits: Globes and orbital soft tissues within normal limits. Mild mucosal thickening present about the ethmoidal air cells. Paranasal sinuses are otherwise clear. No mastoid effusion. Other: None. IMPRESSION: 1. No acute intracranial abnormality. 2. Mild chronic microvascular ischemic disease for age. Electronically Signed   By: Morene Hoard M.D.   On: 08/05/2023 01:11   DG CHEST PORT 1 VIEW Result Date: 08/04/2023 CLINICAL DATA:  Chest pain, dizziness, and nausea EXAM: PORTABLE CHEST 1 VIEW COMPARISON:  None Available. FINDINGS: Single frontal view of the chest demonstrates an unremarkable cardiac silhouette. Lung volumes are diminished, with crowding the central vasculature. No acute airspace disease, effusion, or pneumothorax. No acute bony abnormality. IMPRESSION: 1. Low lung volumes.  No acute airspace disease. Electronically Signed   By: Ozell Daring M.D.   On: 08/04/2023 19:23   CT Angio Head Neck W WO CM Result Date: 08/04/2023 CLINICAL DATA:  Neuro deficit, stroke suspected. Dizziness. Patient feels like rim is pending. Nausea. EXAM: CT ANGIOGRAPHY HEAD AND NECK WITH AND WITHOUT CONTRAST TECHNIQUE: Multidetector CT imaging of the head and neck was performed using the standard protocol during bolus administration of intravenous contrast. Multiplanar CT image reconstructions and MIPs were obtained to evaluate the vascular anatomy. Carotid stenosis measurements (when applicable) are obtained utilizing NASCET criteria, using the distal internal carotid diameter as the denominator. RADIATION DOSE REDUCTION: This exam was performed according to the departmental  dose-optimization program which includes automated exposure control, adjustment of the mA and/or kV according to patient size and/or use of iterative reconstruction technique. CONTRAST:  80mL OMNIPAQUE  IOHEXOL  350 MG/ML SOLN COMPARISON:  None Available. FINDINGS: CTA NECK FINDINGS Aortic arch: Atherosclerotic calcifications are present in the distal aortic arch beyond the great vessel origins. No aneurysm or stenosis is present. Right carotid system: The right common carotid artery is within normal limits. Atherosclerotic changes are present at the proximal right ICA without significant stenosis relative to the more distal vessel. Cervical right ICA is otherwise normal. Left carotid system: The left common carotid artery is within normal limits. Irregular noncalcified plaque is present proximal left ICA without a significant stenosis relative to the more distal vessel. The more distal cervical left ICA is normal. Vertebral arteries: The vertebral arteries are codominant. Both vertebral arteries originate from the subclavian arteries. No significant stenosis is present in either vertebral artery in the neck. The vertebral body heights and alignment are normal. Skeleton: Vertebral body heights and alignment are normal. No focal osseous lesions are present. Other neck: Soft tissues the neck are otherwise unremarkable. Salivary glands are within normal limits. Thyroid  is normal. No significant adenopathy is present. No focal mucosal or submucosal lesions are present. Upper chest: A subpleural lipoma is noted at the right lung apex. Review of the MIP images confirms the above findings CTA HEAD FINDINGS Anterior circulation: The internal carotid arteries are within normal limits from the skull base to the ICA termini. The A1 and M1 segments are normal. MCA bifurcations are within normal limits. The ACA and MCA branch vessels are normal. No aneurysm is present. Posterior circulation: The vertebral arteries are codominant.  Vertebrobasilar junction and basilar artery normal. The superior cerebellar arteries are patent bilaterally. The right posterior cerebral artery originates from basilar tip. The left posterior cerebral artery is of fetal type. PCA branch vessels are within normal limits bilaterally. No aneurysm  is present. Venous sinuses: The dural sinuses are patent. The straight sinus and deep cerebral veins are intact. Cortical veins are within normal limits. No significant vascular malformation is evident. Anatomic variants: Fetal type left posterior cerebral artery. Review of the MIP images confirms the above findings IMPRESSION: 1. No emergent large vessel occlusion. 2. Atherosclerotic changes at the proximal internal carotid arteries bilaterally without significant stenosis relative to the more distal vessels. 3. Normal variant CTA Circle of Willis without significant proximal stenosis, aneurysm, or branch vessel occlusion. 4.  Aortic Atherosclerosis (ICD10-I70.0). Electronically Signed   By: Lonni Necessary M.D.   On: 08/04/2023 19:18   CT HEAD CODE STROKE WO CONTRAST Result Date: 08/04/2023 CLINICAL DATA:  Code stroke. Neuro deficit, acute, stroke suspected. Patient feels the room is spinning. She is nauseated. EXAM: CT HEAD WITHOUT CONTRAST TECHNIQUE: Contiguous axial images were obtained from the base of the skull through the vertex without intravenous contrast. RADIATION DOSE REDUCTION: This exam was performed according to the departmental dose-optimization program which includes automated exposure control, adjustment of the mA and/or kV according to patient size and/or use of iterative reconstruction technique. COMPARISON:  None Available. FINDINGS: Brain: No acute infarct, hemorrhage, or mass lesion is present. No significant white matter lesions are present. Deep brain nuclei are within normal limits. The ventricles are of normal size. No significant extraaxial fluid collection is present. The brainstem and  cerebellum are within normal limits. Midline structures are within normal limits. Vascular: No hyperdense vessel or unexpected calcification. Skull: No significant extracranial soft tissue lesion is present. Calvarium is intact. No focal lytic or blastic lesions are present. Hyperostosis is noted. Sinuses/Orbits: The paranasal sinuses and mastoid air cells are clear. The globes and orbits are within normal limits. ASPECTS [facility] Stroke Program Early CT Score) - Ganglionic level infarction (caudate, lentiform nuclei, internal capsule, insula, M1-M3 cortex): 7/7 - Supraganglionic infarction (M4-M6 cortex): 3/3 Total score (0-10 with 10 being normal): 10/10 IMPRESSION: 1. Normal head CT. 2. Aspects is 10/10. The above was relayed via text pager to Dr. Jerri on 08/04/2023 at 16:50 . Electronically Signed   By: Lonni Necessary M.D.   On: 08/04/2023 16:52    Assessment & Plan:   Problem List Items Addressed This Visit     B12 deficiency   On B12      Vitamin D  deficiency   Vit D3 and K2 Double the amount for now.  Improve compliance      Ankle sprain - Primary    R ankle injury 40 days ago.  It is still painful, swollen and discolored.  She has been limping.  She has been seeing at [facility] X-rays are negative for fracture Blue-Emu cream was recommended to use 2-3 times a day Elastic sleeve brace, comfortable shoes Elevate leg Heat Meloxicam  15 mg daily prescribed Follow-up with me in 1 month          Meds ordered this encounter  Medications   meloxicam  (MOBIC ) 15 MG tablet    Sig: Take 1 tablet (15 mg total) by mouth daily.    Dispense:  30 tablet    Refill:  1      Follow-up: Return in about 3 months (around 06/14/2024) for a follow-up visit.  Marolyn Noel, MD

## 2024-03-19 DIAGNOSIS — S93409A Sprain of unspecified ligament of unspecified ankle, initial encounter: Secondary | ICD-10-CM | POA: Insufficient documentation

## 2024-03-19 NOTE — Assessment & Plan Note (Signed)
 Vit D3 and K2 Double the amount for now.  Improve compliance

## 2024-03-19 NOTE — Assessment & Plan Note (Signed)
 On B12

## 2024-03-19 NOTE — Assessment & Plan Note (Signed)
 R ankle injury 40 days ago.  It is still painful, swollen and discolored.  She has been limping.  She has been seeing at [facility] X-rays are negative for fracture Blue-Emu cream was recommended to use 2-3 times a day Elastic sleeve brace, comfortable shoes Elevate leg Heat Meloxicam  15 mg daily prescribed Follow-up with me in 1 month

## 2024-03-28 DIAGNOSIS — M19071 Primary osteoarthritis, right ankle and foot: Secondary | ICD-10-CM | POA: Diagnosis not present

## 2024-04-05 DIAGNOSIS — M25571 Pain in right ankle and joints of right foot: Secondary | ICD-10-CM | POA: Diagnosis not present

## 2024-04-11 DIAGNOSIS — H04123 Dry eye syndrome of bilateral lacrimal glands: Secondary | ICD-10-CM | POA: Diagnosis not present

## 2024-04-11 DIAGNOSIS — H43811 Vitreous degeneration, right eye: Secondary | ICD-10-CM | POA: Diagnosis not present

## 2024-04-11 DIAGNOSIS — H2513 Age-related nuclear cataract, bilateral: Secondary | ICD-10-CM | POA: Diagnosis not present

## 2024-04-11 DIAGNOSIS — H35413 Lattice degeneration of retina, bilateral: Secondary | ICD-10-CM | POA: Diagnosis not present

## 2024-04-11 DIAGNOSIS — H524 Presbyopia: Secondary | ICD-10-CM | POA: Diagnosis not present

## 2024-04-11 DIAGNOSIS — H5203 Hypermetropia, bilateral: Secondary | ICD-10-CM | POA: Diagnosis not present

## 2024-04-13 ENCOUNTER — Other Ambulatory Visit: Payer: Self-pay | Admitting: Internal Medicine

## 2024-05-07 ENCOUNTER — Other Ambulatory Visit: Payer: Self-pay | Admitting: Internal Medicine

## 2024-05-11 ENCOUNTER — Other Ambulatory Visit: Payer: Self-pay | Admitting: Internal Medicine

## 2024-06-17 ENCOUNTER — Ambulatory Visit: Admitting: Internal Medicine

## 2024-06-17 ENCOUNTER — Encounter: Payer: Self-pay | Admitting: Internal Medicine

## 2024-06-17 VITALS — BP 136/90 | HR 101 | Temp 98.6°F | Ht 65.0 in | Wt 196.6 lb

## 2024-06-17 DIAGNOSIS — E538 Deficiency of other specified B group vitamins: Secondary | ICD-10-CM

## 2024-06-17 DIAGNOSIS — E039 Hypothyroidism, unspecified: Secondary | ICD-10-CM

## 2024-06-17 DIAGNOSIS — R739 Hyperglycemia, unspecified: Secondary | ICD-10-CM

## 2024-06-17 DIAGNOSIS — I10 Essential (primary) hypertension: Secondary | ICD-10-CM

## 2024-06-17 DIAGNOSIS — Z0000 Encounter for general adult medical examination without abnormal findings: Secondary | ICD-10-CM

## 2024-06-17 DIAGNOSIS — E559 Vitamin D deficiency, unspecified: Secondary | ICD-10-CM | POA: Diagnosis not present

## 2024-06-17 DIAGNOSIS — E785 Hyperlipidemia, unspecified: Secondary | ICD-10-CM

## 2024-06-17 DIAGNOSIS — D751 Secondary polycythemia: Secondary | ICD-10-CM

## 2024-06-17 MED ORDER — CEPHALEXIN 500 MG PO CAPS: ORAL_CAPSULE | ORAL | 1 refills | Status: DC

## 2024-06-17 NOTE — Assessment & Plan Note (Signed)
We discussed age appropriate health related issues, including available/recomended screening tests and vaccinations. We discussed a need for adhering to healthy diet and exercise. Labs/EKG were reviewed/ordered. All questions were answered.  Labs 

## 2024-06-17 NOTE — Assessment & Plan Note (Signed)
 Coronary calcium score of 0. This is a low risk study. On Krill oil

## 2024-06-17 NOTE — Assessment & Plan Note (Signed)
Chronic - not taking Rx Monitor TSH

## 2024-06-17 NOTE — Progress Notes (Signed)
 Subjective:  Patient ID: Kayla Aguirre, female    DOB: 10-11-1950  Age: 73 y.o. MRN: [id number]  CC: Follow-up (Patient has to ask you a couple things. )   HPI Kayla Aguirre presents for HTN, dyslipidemia, B12 def  Outpatient Medications Prior to Visit  Medication Sig Dispense Refill  . amLODIPine -Valsartan -HCTZ 10-160-12.5 MG TABS TAKE 1 TABLET BY MOUTH EVERY DAY 90 tablet 3  . famotidine  (PEPCID ) 40 MG tablet TAKE 1 TABLET BY MOUTH EVERY DAY 90 tablet 3  . triamcinolone  cream (KENALOG ) 0.1 % Apply 1 Application topically 2 (two) times daily as needed. 80 g 3  . Vitamin D , Ergocalciferol , (DRISDOL ) 1.25 MG (50000 UNIT) CAPS capsule TAKE 1 CAPSULE (50,000 UNITS TOTAL) BY MOUTH EVERY 7 (SEVEN) DAYS 8 capsule 0  . aspirin  EC 81 MG tablet Take 1 tablet (81 mg total) by mouth daily. Swallow whole. (Patient not taking: Reported on 06/17/2024) 150 tablet 0  . Cholecalciferol (VITAMIN D3) 50 MCG (2000 UT) capsule Take 1 capsule (2,000 Units total) by mouth daily. (Patient not taking: Reported on 03/14/2024) 100 capsule 3  . Cyanocobalamin  (VITAMIN B-12) 1000 MCG SUBL Place 1 tablet (1,000 mcg total) under the tongue daily. (Patient not taking: Reported on 03/14/2024) 100 tablet 3  . diazepam  (VALIUM ) 5 MG tablet Take 1 tablet (5 mg total) by mouth every 8 (eight) hours as needed (severe vertigo). (Patient not taking: Reported on 06/17/2024) 30 tablet 1  . meclizine  (ANTIVERT ) 25 MG tablet Take 1 tablet (25 mg total) by mouth 3 (three) times daily as needed for dizziness. (Patient not taking: Reported on 06/17/2024) 30 tablet 0  . MegaRed Omega-3 Krill Oil 500 MG CAPS Take 1 capsule by mouth every morning. (Patient not taking: Reported on 06/17/2024) 100 capsule 3  . meloxicam  (MOBIC ) 15 MG tablet TAKE 1 TABLET (15 MG TOTAL) BY MOUTH DAILY. (Patient not taking: Reported on 06/17/2024) 30 tablet 1  . ondansetron  (ZOFRAN -ODT) 4 MG disintegrating tablet Take 1 tablet (4 mg total) by mouth every 8  (eight) hours as needed for nausea or vomiting. (Patient not taking: Reported on 06/17/2024) 20 tablet 1  . cephALEXin  (KEFLEX ) 500 MG capsule Use one QID x 5 days prn bladder infection (Patient not taking: Reported on 06/17/2024) 40 capsule 1   No facility-administered medications prior to visit.    ROS: Review of Systems  Constitutional:  Negative for activity change, appetite change, chills, fatigue and unexpected weight change.  HENT:  Negative for congestion, mouth sores and sinus pressure.   Eyes:  Negative for visual disturbance.  Respiratory:  Negative for cough and chest tightness.   Gastrointestinal:  Negative for abdominal pain and nausea.  Genitourinary:  Negative for difficulty urinating, frequency and vaginal pain.  Musculoskeletal:  Negative for back pain and gait problem.  Skin:  Negative for pallor and rash.  Neurological:  Negative for dizziness, tremors, weakness, numbness and headaches.  Psychiatric/Behavioral:  Negative for confusion, sleep disturbance and suicidal ideas.     Objective:  BP (!) 136/90   Pulse (!) 101   Temp 98.6 F (37 C) (Oral)   Ht 5' 5 (1.651 m)   Wt 196 lb 9.6 oz (89.2 kg)   SpO2 95%   BMI 32.72 kg/m   BP Readings from Last 3 Encounters:  06/17/24 (!) 136/90  03/14/24 120/70  12/13/23 130/82    Wt Readings from Last 3 Encounters:  06/17/24 196 lb 9.6 oz (89.2 kg)  03/14/24 193 lb (87.5 kg)  12/13/23 197 lb 6.4 oz (89.5 kg)    Physical Exam Constitutional:      General: She is not in acute distress.    Appearance: Normal appearance. She is well-developed.  HENT:     Head: Normocephalic.     Right Ear: External ear normal.     Left Ear: External ear normal.     Nose: Nose normal.  Eyes:     General:        Right eye: No discharge.        Left eye: No discharge.     Conjunctiva/sclera: Conjunctivae normal.     Pupils: Pupils are equal, round, and reactive to light.  Neck:     Thyroid : No thyromegaly.     Vascular: No  JVD.     Trachea: No tracheal deviation.  Cardiovascular:     Rate and Rhythm: Normal rate and regular rhythm.     Heart sounds: Normal heart sounds.  Pulmonary:     Effort: No respiratory distress.     Breath sounds: No stridor. No wheezing.  Abdominal:     General: Bowel sounds are normal. There is no distension.     Palpations: Abdomen is soft. There is no mass.     Tenderness: There is no abdominal tenderness. There is no guarding or rebound.  Musculoskeletal:        General: No tenderness.     Cervical back: Normal range of motion and neck supple. No rigidity.  Lymphadenopathy:     Cervical: No cervical adenopathy.  Skin:    Findings: No erythema or rash.  Neurological:     Cranial Nerves: No cranial nerve deficit.     Motor: No abnormal muscle tone.     Coordination: Coordination normal.     Deep Tendon Reflexes: Reflexes normal.  Psychiatric:        Behavior: Behavior normal.        Thought Content: Thought content normal.        Judgment: Judgment normal.     Lab Results  Component Value Date   WBC 5.7 12/11/2023   HGB 14.9 12/11/2023   HCT 44.7 12/11/2023   PLT 226.0 12/11/2023   GLUCOSE 130 (H) 12/11/2023   CHOL 238 (H) 12/11/2023   TRIG 175.0 (H) 12/11/2023   HDL 46.80 12/11/2023   LDLDIRECT 169.0 03/17/2023   LDLCALC 156 (H) 12/11/2023   ALT 46 (H) 12/11/2023   AST 35 12/11/2023   NA 139 12/11/2023   K 4.0 12/11/2023   CL 105 12/11/2023   CREATININE 0.98 12/11/2023   BUN 18 12/11/2023   CO2 28 12/11/2023   TSH 2.48 12/11/2023   INR 1.0 08/05/2023   HGBA1C 6.7 (H) 12/11/2023    ECHOCARDIOGRAM COMPLETE Result Date: 08/05/2023    ECHOCARDIOGRAM REPORT   Patient Name:   Kayla Aguirre [facility] Date of Exam: 08/05/2023 Medical Rec #:  [id number]         Height:       65.0 in Accession #:    [id number]        Weight:       192.0 lb Date of Birth:  Feb 03, 1951         BSA:          1.944 m Patient Age:    72 years          BP:           150/82 mmHg Patient  Gender: F  HR:           81 bpm. Exam Location:  Inpatient Procedure: 2D Echo, Cardiac Doppler, Color Doppler and Strain Analysis Indications:    TIA  History:        Patient has no prior history of Echocardiogram examinations.                 Risk Factors:Hypertension.  Sonographer:    Ozell Free Referring Phys: [id number] [facility] GOEL  Sonographer Comments: Global longitudinal strain was attempted. IMPRESSIONS  1. Left ventricular ejection fraction, by estimation, is 70 to 75%. The left ventricle has hyperdynamic function. The left ventricle has no regional wall motion abnormalities. There is mild left ventricular hypertrophy. Left ventricular diastolic parameters are consistent with Grade I diastolic dysfunction (impaired relaxation).  2. Right ventricular systolic function is normal. The right ventricular size is normal. There is normal pulmonary artery systolic pressure. The estimated right ventricular systolic pressure is 22.9 mmHg.  3. The mitral valve is normal in structure. Trivial mitral valve regurgitation. No evidence of mitral stenosis.  4. The aortic valve is grossly normal. Aortic valve regurgitation is not visualized. No aortic stenosis is present.  5. The inferior vena cava is normal in size with greater than 50% respiratory variability, suggesting right atrial pressure of 3 mmHg. FINDINGS  Left Ventricle: Left ventricular ejection fraction, by estimation, is 70 to 75%. The left ventricle has hyperdynamic function. The left ventricle has no regional wall motion abnormalities. Global longitudinal strain performed but not reported based on interpreter judgement due to suboptimal tracking. The left ventricular internal cavity size was normal in size. There is mild left ventricular hypertrophy. Left ventricular diastolic parameters are consistent with Grade I diastolic dysfunction (impaired relaxation). Right Ventricle: The right ventricular size is normal. No increase in right ventricular  wall thickness. Right ventricular systolic function is normal. There is normal pulmonary artery systolic pressure. The tricuspid regurgitant velocity is 2.23 m/s, and  with an assumed right atrial pressure of 3 mmHg, the estimated right ventricular systolic pressure is 22.9 mmHg. Left Atrium: Left atrial size was normal in size. Right Atrium: Right atrial size was normal in size. Pericardium: There is no evidence of pericardial effusion. Presence of epicardial fat layer. Mitral Valve: The mitral valve is normal in structure. Trivial mitral valve regurgitation. No evidence of mitral valve stenosis. The mean mitral valve gradient is 2.5 mmHg. Tricuspid Valve: The tricuspid valve is normal in structure. Tricuspid valve regurgitation is trivial. No evidence of tricuspid stenosis. Aortic Valve: The aortic valve is grossly normal. Aortic valve regurgitation is not visualized. No aortic stenosis is present. Aortic valve mean gradient measures 4.0 mmHg. Aortic valve peak gradient measures 8.0 mmHg. Aortic valve area, by VTI measures 2.87 cm. Pulmonic Valve: The pulmonic valve was normal in structure. Pulmonic valve regurgitation is not visualized. No evidence of pulmonic stenosis. Aorta: The aortic root is normal in size and structure. Venous: The inferior vena cava is normal in size with greater than 50% respiratory variability, suggesting right atrial pressure of 3 mmHg. IAS/Shunts: No atrial level shunt detected by color flow Doppler.  LEFT VENTRICLE PLAX 2D LVIDd:         4.30 cm   Diastology LVIDs:         2.20 cm   LV e' medial:    5.87 cm/s LV PW:         1.10 cm   LV E/e' medial:  16.9 LV IVS:  1.00 cm   LV e' lateral:   9.90 cm/s LVOT diam:     2.00 cm   LV E/e' lateral: 10.0 LV SV:         83 LV SV Index:   43 LVOT Area:     3.14 cm  RIGHT VENTRICLE             IVC RV Basal diam:  3.30 cm     IVC diam: 1.50 cm RV S prime:     14.70 cm/s TAPSE (M-mode): 2.4 cm LEFT ATRIUM             Index        RIGHT  ATRIUM           Index LA diam:        3.60 cm 1.85 cm/m   RA Area:     15.00 cm LA Vol (A2C):   54.0 ml 27.78 ml/m  RA Volume:   37.10 ml  19.08 ml/m LA Vol (A4C):   47.2 ml 24.28 ml/m LA Biplane Vol: 51.2 ml 26.34 ml/m  AORTIC VALVE AV Area (Vmax):    2.83 cm AV Area (Vmean):   2.74 cm AV Area (VTI):     2.87 cm AV Vmax:           141.00 cm/s AV Vmean:          96.400 cm/s AV VTI:            0.290 m AV Peak Grad:      8.0 mmHg AV Mean Grad:      4.0 mmHg LVOT Vmax:         127.00 cm/s LVOT Vmean:        84.000 cm/s LVOT VTI:          0.265 m LVOT/AV VTI ratio: 0.91  AORTA Ao Root diam: 2.60 cm Ao Asc diam:  2.90 cm MITRAL VALVE                TRICUSPID VALVE MV Area (PHT): 3.77 cm     TR Peak grad:   19.9 mmHg MV Mean grad:  2.5 mmHg     TR Vmax:        223.00 cm/s MV Decel Time: 201 msec MV E velocity: 99.40 cm/s   SHUNTS MV A velocity: 129.00 cm/s  Systemic VTI:  0.26 m MV E/A ratio:  0.77         Systemic Diam: 2.00 cm Soyla Merck MD Electronically signed by Soyla Merck MD Signature Date/Time: 08/05/2023/2:02:51 PM    Final    MR BRAIN WO CONTRAST Result Date: 08/05/2023 CLINICAL DATA:  Follow-up examination for stroke. EXAM: MRI HEAD WITHOUT CONTRAST TECHNIQUE: Multiplanar, multiecho pulse sequences of the brain and surrounding structures were obtained without intravenous contrast. COMPARISON:  Prior CT from earlier the same day. FINDINGS: Brain: Cerebral volume within normal limits. Patchy T2/FLAIR hyperintensity involving the periventricular, deep, and subcortical white matter both cerebral hemispheres, most like related chronic microvascular ischemic disease, mild for age. No evidence for acute or subacute ischemia. Gray-white matter differentiation maintained. No acute or chronic intracranial blood products. No mass lesion, midline shift or mass effect. No hydrocephalus or extra-axial fluid collection. Pituitary gland and suprasellar region within normal limits. Vascular: Major  intracranial vascular flow voids are maintained. Skull and upper cervical spine: Craniocervical junctional limits. Bone marrow signal intensity normal. No scalp soft tissue abnormality. Sinuses/Orbits: Globes and orbital soft tissues within normal limits. Mild mucosal thickening  present about the ethmoidal air cells. Paranasal sinuses are otherwise clear. No mastoid effusion. Other: None. IMPRESSION: 1. No acute intracranial abnormality. 2. Mild chronic microvascular ischemic disease for age. Electronically Signed   By: Morene Hoard M.D.   On: 08/05/2023 01:11   DG CHEST PORT 1 VIEW Result Date: 08/04/2023 CLINICAL DATA:  Chest pain, dizziness, and nausea EXAM: PORTABLE CHEST 1 VIEW COMPARISON:  None Available. FINDINGS: Single frontal view of the chest demonstrates an unremarkable cardiac silhouette. Lung volumes are diminished, with crowding the central vasculature. No acute airspace disease, effusion, or pneumothorax. No acute bony abnormality. IMPRESSION: 1. Low lung volumes.  No acute airspace disease. Electronically Signed   By: Ozell Daring M.D.   On: 08/04/2023 19:23   CT Angio Head Neck W WO CM Result Date: 08/04/2023 CLINICAL DATA:  Neuro deficit, stroke suspected. Dizziness. Patient feels like rim is pending. Nausea. EXAM: CT ANGIOGRAPHY HEAD AND NECK WITH AND WITHOUT CONTRAST TECHNIQUE: Multidetector CT imaging of the head and neck was performed using the standard protocol during bolus administration of intravenous contrast. Multiplanar CT image reconstructions and MIPs were obtained to evaluate the vascular anatomy. Carotid stenosis measurements (when applicable) are obtained utilizing NASCET criteria, using the distal internal carotid diameter as the denominator. RADIATION DOSE REDUCTION: This exam was performed according to the departmental dose-optimization program which includes automated exposure control, adjustment of the mA and/or kV according to patient size and/or use of  iterative reconstruction technique. CONTRAST:  80mL OMNIPAQUE  IOHEXOL  350 MG/ML SOLN COMPARISON:  None Available. FINDINGS: CTA NECK FINDINGS Aortic arch: Atherosclerotic calcifications are present in the distal aortic arch beyond the great vessel origins. No aneurysm or stenosis is present. Right carotid system: The right common carotid artery is within normal limits. Atherosclerotic changes are present at the proximal right ICA without significant stenosis relative to the more distal vessel. Cervical right ICA is otherwise normal. Left carotid system: The left common carotid artery is within normal limits. Irregular noncalcified plaque is present proximal left ICA without a significant stenosis relative to the more distal vessel. The more distal cervical left ICA is normal. Vertebral arteries: The vertebral arteries are codominant. Both vertebral arteries originate from the subclavian arteries. No significant stenosis is present in either vertebral artery in the neck. The vertebral body heights and alignment are normal. Skeleton: Vertebral body heights and alignment are normal. No focal osseous lesions are present. Other neck: Soft tissues the neck are otherwise unremarkable. Salivary glands are within normal limits. Thyroid  is normal. No significant adenopathy is present. No focal mucosal or submucosal lesions are present. Upper chest: A subpleural lipoma is noted at the right lung apex. Review of the MIP images confirms the above findings CTA HEAD FINDINGS Anterior circulation: The internal carotid arteries are within normal limits from the skull base to the ICA termini. The A1 and M1 segments are normal. MCA bifurcations are within normal limits. The ACA and MCA branch vessels are normal. No aneurysm is present. Posterior circulation: The vertebral arteries are codominant. Vertebrobasilar junction and basilar artery normal. The superior cerebellar arteries are patent bilaterally. The right posterior cerebral  artery originates from basilar tip. The left posterior cerebral artery is of fetal type. PCA branch vessels are within normal limits bilaterally. No aneurysm is present. Venous sinuses: The dural sinuses are patent. The straight sinus and deep cerebral veins are intact. Cortical veins are within normal limits. No significant vascular malformation is evident. Anatomic variants: Fetal type left posterior cerebral artery. Review of  the MIP images confirms the above findings IMPRESSION: 1. No emergent large vessel occlusion. 2. Atherosclerotic changes at the proximal internal carotid arteries bilaterally without significant stenosis relative to the more distal vessels. 3. Normal variant CTA Circle of Willis without significant proximal stenosis, aneurysm, or branch vessel occlusion. 4.  Aortic Atherosclerosis (ICD10-I70.0). Electronically Signed   By: Lonni Necessary M.D.   On: 08/04/2023 19:18   CT HEAD CODE STROKE WO CONTRAST Result Date: 08/04/2023 CLINICAL DATA:  Code stroke. Neuro deficit, acute, stroke suspected. Patient feels the room is spinning. She is nauseated. EXAM: CT HEAD WITHOUT CONTRAST TECHNIQUE: Contiguous axial images were obtained from the base of the skull through the vertex without intravenous contrast. RADIATION DOSE REDUCTION: This exam was performed according to the departmental dose-optimization program which includes automated exposure control, adjustment of the mA and/or kV according to patient size and/or use of iterative reconstruction technique. COMPARISON:  None Available. FINDINGS: Brain: No acute infarct, hemorrhage, or mass lesion is present. No significant white matter lesions are present. Deep brain nuclei are within normal limits. The ventricles are of normal size. No significant extraaxial fluid collection is present. The brainstem and cerebellum are within normal limits. Midline structures are within normal limits. Vascular: No hyperdense vessel or unexpected  calcification. Skull: No significant extracranial soft tissue lesion is present. Calvarium is intact. No focal lytic or blastic lesions are present. Hyperostosis is noted. Sinuses/Orbits: The paranasal sinuses and mastoid air cells are clear. The globes and orbits are within normal limits. ASPECTS [facility] Stroke Program Early CT Score) - Ganglionic level infarction (caudate, lentiform nuclei, internal capsule, insula, M1-M3 cortex): 7/7 - Supraganglionic infarction (M4-M6 cortex): 3/3 Total score (0-10 with 10 being normal): 10/10 IMPRESSION: 1. Normal head CT. 2. Aspects is 10/10. The above was relayed via text pager to Dr. Jerri on 08/04/2023 at 16:50 . Electronically Signed   By: Lonni Necessary M.D.   On: 08/04/2023 16:52    Assessment & Plan:   Problem List Items Addressed This Visit     B12 deficiency   On B12      Dyslipidemia   Coronary calcium  score of 0. This is a low risk study. On Krill oil      HTN (hypertension)   We discussed compliance, wt loss On [place]  HCT      Hyperglycemia   Relevant Orders   Hemoglobin A1c   Microalbumin / creatinine urine ratio   Hypothyroidism   Chronic - not taking Rx Monitor TSH      Polycythemia - Primary   Check CBC      Relevant Orders   CBC with Differential/Platelet   Vitamin D  deficiency   Vit D3 and K2 Double the amount for now.  Improve compliance      Well adult exam   We discussed age appropriate health related issues, including available/recomended screening tests and vaccinations. We discussed a need for adhering to healthy diet and exercise. Labs/EKG were reviewed/ordered. All questions were answered. Labs      Relevant Orders   Comprehensive metabolic panel with GFR   Hemoglobin A1c   TSH   Microalbumin / creatinine urine ratio   Lipid panel      Meds ordered this encounter  Medications  . cephALEXin  (KEFLEX ) 500 MG capsule    Sig: Use one QID x 5 days prn bladder infection    Dispense:  40 capsule     Refill:  1      Follow-up: Return in about 3  months (around 09/16/2024) for a follow-up visit.  Marolyn Noel, MD

## 2024-06-17 NOTE — Assessment & Plan Note (Signed)
 Vit D3 and K2 Double the amount for now.  Improve compliance

## 2024-06-17 NOTE — Assessment & Plan Note (Signed)
We discussed compliance, wt loss On Exforge HCT

## 2024-06-17 NOTE — Assessment & Plan Note (Signed)
 On B12

## 2024-06-17 NOTE — Assessment & Plan Note (Signed)
 Check CBC

## 2024-07-06 ENCOUNTER — Other Ambulatory Visit: Payer: Self-pay | Admitting: Internal Medicine

## 2024-09-16 ENCOUNTER — Other Ambulatory Visit

## 2024-09-16 DIAGNOSIS — R739 Hyperglycemia, unspecified: Secondary | ICD-10-CM

## 2024-09-16 DIAGNOSIS — D751 Secondary polycythemia: Secondary | ICD-10-CM | POA: Diagnosis not present

## 2024-09-16 DIAGNOSIS — Z0000 Encounter for general adult medical examination without abnormal findings: Secondary | ICD-10-CM

## 2024-09-16 LAB — CBC WITH DIFFERENTIAL/PLATELET
Basophils Absolute: 0 K/uL (ref 0.0–0.1)
Basophils Relative: 0.8 % (ref 0.0–3.0)
Eosinophils Absolute: 0.2 K/uL (ref 0.0–0.7)
Eosinophils Relative: 2.8 % (ref 0.0–5.0)
HCT: 43.8 % (ref 36.0–46.0)
Hemoglobin: 14.8 g/dL (ref 12.0–15.0)
Lymphocytes Relative: 31.5 % (ref 12.0–46.0)
Lymphs Abs: 1.9 K/uL (ref 0.7–4.0)
MCHC: 33.8 g/dL (ref 30.0–36.0)
MCV: 83.5 fl (ref 78.0–100.0)
Monocytes Absolute: 0.5 K/uL (ref 0.1–1.0)
Monocytes Relative: 8.4 % (ref 3.0–12.0)
Neutro Abs: 3.5 K/uL (ref 1.4–7.7)
Neutrophils Relative %: 56.5 % (ref 43.0–77.0)
Platelets: 223 K/uL (ref 150.0–400.0)
RBC: 5.25 Mil/uL — ABNORMAL HIGH (ref 3.87–5.11)
RDW: 13.7 % (ref 11.5–15.5)
WBC: 6.2 K/uL (ref 4.0–10.5)

## 2024-09-16 LAB — COMPREHENSIVE METABOLIC PANEL WITH GFR
ALT: 44 U/L — ABNORMAL HIGH (ref 3–35)
AST: 34 U/L (ref 5–37)
Albumin: 4 g/dL (ref 3.5–5.2)
Alkaline Phosphatase: 82 U/L (ref 39–117)
BUN: 9 mg/dL (ref 6–23)
CO2: 26 meq/L (ref 19–32)
Calcium: 9 mg/dL (ref 8.4–10.5)
Chloride: 104 meq/L (ref 96–112)
Creatinine, Ser: 0.8 mg/dL (ref 0.40–1.20)
GFR: 73.55 mL/min
Glucose, Bld: 143 mg/dL — ABNORMAL HIGH (ref 70–99)
Potassium: 4.1 meq/L (ref 3.5–5.1)
Sodium: 139 meq/L (ref 135–145)
Total Bilirubin: 1.1 mg/dL (ref 0.2–1.2)
Total Protein: 7 g/dL (ref 6.0–8.3)

## 2024-09-16 LAB — MICROALBUMIN / CREATININE URINE RATIO
Creatinine,U: 146.4 mg/dL
Microalb Creat Ratio: 10.7 mg/g (ref 0.0–30.0)
Microalb, Ur: 1.6 mg/dL (ref 0.7–1.9)

## 2024-09-16 LAB — LIPID PANEL
Cholesterol: 246 mg/dL — ABNORMAL HIGH (ref 28–200)
HDL: 50 mg/dL
LDL Cholesterol: 152 mg/dL — ABNORMAL HIGH (ref 10–99)
NonHDL: 196.27
Total CHOL/HDL Ratio: 5
Triglycerides: 223 mg/dL — ABNORMAL HIGH (ref 10.0–149.0)
VLDL: 44.6 mg/dL — ABNORMAL HIGH (ref 0.0–40.0)

## 2024-09-16 LAB — HEMOGLOBIN A1C: Hgb A1c MFr Bld: 6.4 % (ref 4.6–6.5)

## 2024-09-16 LAB — TSH: TSH: 5.49 u[IU]/mL (ref 0.35–5.50)

## 2024-09-17 ENCOUNTER — Encounter: Payer: Self-pay | Admitting: Internal Medicine

## 2024-09-17 ENCOUNTER — Other Ambulatory Visit: Payer: Self-pay | Admitting: Internal Medicine

## 2024-09-17 ENCOUNTER — Ambulatory Visit: Admitting: Internal Medicine

## 2024-09-17 VITALS — BP 132/82 | HR 76 | Temp 97.6°F | Ht 65.0 in | Wt 189.0 lb

## 2024-09-17 DIAGNOSIS — E039 Hypothyroidism, unspecified: Secondary | ICD-10-CM | POA: Diagnosis not present

## 2024-09-17 DIAGNOSIS — E559 Vitamin D deficiency, unspecified: Secondary | ICD-10-CM | POA: Diagnosis not present

## 2024-09-17 DIAGNOSIS — D751 Secondary polycythemia: Secondary | ICD-10-CM

## 2024-09-17 DIAGNOSIS — Z91199 Patient's noncompliance with other medical treatment and regimen due to unspecified reason: Secondary | ICD-10-CM | POA: Diagnosis not present

## 2024-09-17 DIAGNOSIS — I10 Essential (primary) hypertension: Secondary | ICD-10-CM | POA: Diagnosis not present

## 2024-09-17 DIAGNOSIS — E538 Deficiency of other specified B group vitamins: Secondary | ICD-10-CM | POA: Diagnosis not present

## 2024-09-17 DIAGNOSIS — R739 Hyperglycemia, unspecified: Secondary | ICD-10-CM | POA: Diagnosis not present

## 2024-09-17 MED ORDER — CEPHALEXIN 500 MG PO CAPS: ORAL_CAPSULE | ORAL | 1 refills | Status: AC

## 2024-09-17 NOTE — Assessment & Plan Note (Addendum)
 Vit D3 and K2 Double the amount for now.  Improve complianceVit D3 and K2

## 2024-09-17 NOTE — Assessment & Plan Note (Signed)
We discussed compliance, wt loss On Exforge HCT

## 2024-09-17 NOTE — Assessment & Plan Note (Signed)
Chronic - not taking Rx Monitor TSH

## 2024-09-17 NOTE — Assessment & Plan Note (Signed)
 Better

## 2024-09-17 NOTE — Progress Notes (Signed)
 "  Subjective:  Patient ID: Kayla Aguirre, female    DOB: December 20, 1950  Age: 73 y.o. MRN: [id number]  CC: Medical Management of Chronic Issues (3 month f/u)   HPI Kayla Aguirre presents for HTN, DM, hypothyroidism  Pt had food poisoning 2 mo ago - had LOC  Outpatient Medications Prior to Visit  Medication Sig Dispense Refill   amLODIPine -Valsartan -HCTZ 10-160-12.5 MG TABS TAKE 1 TABLET BY MOUTH EVERY DAY 90 tablet 3   famotidine  (PEPCID ) 40 MG tablet TAKE 1 TABLET BY MOUTH EVERY DAY 90 tablet 3   triamcinolone  cream (KENALOG ) 0.1 % Apply 1 Application topically 2 (two) times daily as needed. 80 g 3   Vitamin D , Ergocalciferol , (DRISDOL ) 1.25 MG (50000 UNIT) CAPS capsule TAKE 1 CAPSULE (50,000 UNITS TOTAL) BY MOUTH EVERY 7 (SEVEN) DAYS 8 capsule 0   cephALEXin  (KEFLEX ) 500 MG capsule Use one QID x 5 days prn bladder infection 40 capsule 1   Cholecalciferol (VITAMIN D3) 50 MCG (2000 UT) capsule Take 1 capsule (2,000 Units total) by mouth daily. (Patient not taking: Reported on 09/17/2024) 100 capsule 3   Cyanocobalamin  (VITAMIN B-12) 1000 MCG SUBL Place 1 tablet (1,000 mcg total) under the tongue daily. (Patient not taking: Reported on 09/17/2024) 100 tablet 3   diazepam  (VALIUM ) 5 MG tablet Take 1 tablet (5 mg total) by mouth every 8 (eight) hours as needed (severe vertigo). (Patient not taking: Reported on 09/17/2024) 30 tablet 1   meclizine  (ANTIVERT ) 25 MG tablet Take 1 tablet (25 mg total) by mouth 3 (three) times daily as needed for dizziness. (Patient not taking: Reported on 09/17/2024) 30 tablet 0   MegaRed Omega-3 Krill Oil 500 MG CAPS Take 1 capsule by mouth every morning. (Patient not taking: Reported on 09/17/2024) 100 capsule 3   meloxicam  (MOBIC ) 15 MG tablet TAKE 1 TABLET (15 MG TOTAL) BY MOUTH DAILY. 30 tablet 1   ondansetron  (ZOFRAN -ODT) 4 MG disintegrating tablet Take 1 tablet (4 mg total) by mouth every 8 (eight) hours as needed for nausea or vomiting. (Patient not  taking: Reported on 09/17/2024) 20 tablet 1   No facility-administered medications prior to visit.    ROS: Review of Systems  Constitutional:  Negative for activity change, appetite change, chills, fatigue and unexpected weight change.  HENT:  Negative for congestion, mouth sores and sinus pressure.   Eyes:  Negative for visual disturbance.  Respiratory:  Negative for cough and chest tightness.   Gastrointestinal:  Negative for abdominal pain and nausea.  Genitourinary:  Negative for difficulty urinating, frequency and vaginal pain.  Musculoskeletal:  Positive for arthralgias. Negative for back pain and gait problem.  Skin:  Negative for pallor and rash.  Neurological:  Negative for dizziness, tremors, weakness, numbness and headaches.  Psychiatric/Behavioral:  Negative for confusion and sleep disturbance.     Objective:  BP 132/82   Pulse 76   Temp 97.6 F (36.4 C) (Temporal)   Ht 5' 5 (1.651 m)   Wt 189 lb (85.7 kg)   SpO2 96%   BMI 31.45 kg/m   BP Readings from Last 3 Encounters:  09/17/24 132/82  06/17/24 (!) 136/90  03/14/24 120/70    Wt Readings from Last 3 Encounters:  09/17/24 189 lb (85.7 kg)  06/17/24 196 lb 9.6 oz (89.2 kg)  03/14/24 193 lb (87.5 kg)    Physical Exam Constitutional:      General: She is not in acute distress.    Appearance: She is well-developed. She is obese.  HENT:     Head: Normocephalic.     Right Ear: External ear normal.     Left Ear: External ear normal.     Nose: Nose normal.  Eyes:     General:        Right eye: No discharge.        Left eye: No discharge.     Conjunctiva/sclera: Conjunctivae normal.     Pupils: Pupils are equal, round, and reactive to light.  Neck:     Thyroid : No thyromegaly.     Vascular: No JVD.     Trachea: No tracheal deviation.  Cardiovascular:     Rate and Rhythm: Normal rate and regular rhythm.     Heart sounds: Normal heart sounds.  Pulmonary:     Effort: No respiratory distress.      Breath sounds: No stridor. No wheezing.  Abdominal:     General: Bowel sounds are normal. There is no distension.     Palpations: Abdomen is soft. There is no mass.     Tenderness: There is no abdominal tenderness. There is no guarding or rebound.  Musculoskeletal:        General: No tenderness.     Cervical back: Normal range of motion and neck supple. No rigidity.  Lymphadenopathy:     Cervical: No cervical adenopathy.  Skin:    Findings: No erythema or rash.  Neurological:     Cranial Nerves: No cranial nerve deficit.     Motor: No abnormal muscle tone.     Coordination: Coordination normal.     Deep Tendon Reflexes: Reflexes normal.  Psychiatric:        Behavior: Behavior normal.        Thought Content: Thought content normal.        Judgment: Judgment normal.   The patient lost weight  Lab Results  Component Value Date   WBC 6.2 09/16/2024   HGB 14.8 09/16/2024   HCT 43.8 09/16/2024   PLT 223.0 09/16/2024   GLUCOSE 143 (H) 09/16/2024   CHOL 246 (H) 09/16/2024   TRIG 223.0 (H) 09/16/2024   HDL 50.00 09/16/2024   LDLDIRECT 169.0 03/17/2023   LDLCALC 152 (H) 09/16/2024   ALT 44 (H) 09/16/2024   AST 34 09/16/2024   NA 139 09/16/2024   K 4.1 09/16/2024   CL 104 09/16/2024   CREATININE 0.80 09/16/2024   BUN 9 09/16/2024   CO2 26 09/16/2024   TSH 5.49 09/16/2024   INR 1.0 08/05/2023   HGBA1C 6.4 09/16/2024   MICROALBUR 1.6 09/16/2024    ECHOCARDIOGRAM COMPLETE Result Date: 08/05/2023    ECHOCARDIOGRAM REPORT   Patient Name:   Kayla Aguirre [facility] Date of Exam: 08/05/2023 Medical Rec #:  [id number]         Height:       65.0 in Accession #:    [id number]        Weight:       192.0 lb Date of Birth:  27-Dec-1950         BSA:          1.944 m Patient Age:    72 years          BP:           150/82 mmHg Patient Gender: F                 HR:           81 bpm. Exam Location:  Inpatient Procedure: 2D  Echo, Cardiac Doppler, Color Doppler and Strain Analysis Indications:    TIA   History:        Patient has no prior history of Echocardiogram examinations.                 Risk Factors:Hypertension.  Sonographer:    Ozell Free Referring Phys: [id number] [facility] GOEL  Sonographer Comments: Global longitudinal strain was attempted. IMPRESSIONS  1. Left ventricular ejection fraction, by estimation, is 70 to 75%. The left ventricle has hyperdynamic function. The left ventricle has no regional wall motion abnormalities. There is mild left ventricular hypertrophy. Left ventricular diastolic parameters are consistent with Grade I diastolic dysfunction (impaired relaxation).  2. Right ventricular systolic function is normal. The right ventricular size is normal. There is normal pulmonary artery systolic pressure. The estimated right ventricular systolic pressure is 22.9 mmHg.  3. The mitral valve is normal in structure. Trivial mitral valve regurgitation. No evidence of mitral stenosis.  4. The aortic valve is grossly normal. Aortic valve regurgitation is not visualized. No aortic stenosis is present.  5. The inferior vena cava is normal in size with greater than 50% respiratory variability, suggesting right atrial pressure of 3 mmHg. FINDINGS  Left Ventricle: Left ventricular ejection fraction, by estimation, is 70 to 75%. The left ventricle has hyperdynamic function. The left ventricle has no regional wall motion abnormalities. Global longitudinal strain performed but not reported based on interpreter judgement due to suboptimal tracking. The left ventricular internal cavity size was normal in size. There is mild left ventricular hypertrophy. Left ventricular diastolic parameters are consistent with Grade I diastolic dysfunction (impaired relaxation). Right Ventricle: The right ventricular size is normal. No increase in right ventricular wall thickness. Right ventricular systolic function is normal. There is normal pulmonary artery systolic pressure. The tricuspid regurgitant velocity is 2.23 m/s, and   with an assumed right atrial pressure of 3 mmHg, the estimated right ventricular systolic pressure is 22.9 mmHg. Left Atrium: Left atrial size was normal in size. Right Atrium: Right atrial size was normal in size. Pericardium: There is no evidence of pericardial effusion. Presence of epicardial fat layer. Mitral Valve: The mitral valve is normal in structure. Trivial mitral valve regurgitation. No evidence of mitral valve stenosis. The mean mitral valve gradient is 2.5 mmHg. Tricuspid Valve: The tricuspid valve is normal in structure. Tricuspid valve regurgitation is trivial. No evidence of tricuspid stenosis. Aortic Valve: The aortic valve is grossly normal. Aortic valve regurgitation is not visualized. No aortic stenosis is present. Aortic valve mean gradient measures 4.0 mmHg. Aortic valve peak gradient measures 8.0 mmHg. Aortic valve area, by VTI measures 2.87 cm. Pulmonic Valve: The pulmonic valve was normal in structure. Pulmonic valve regurgitation is not visualized. No evidence of pulmonic stenosis. Aorta: The aortic root is normal in size and structure. Venous: The inferior vena cava is normal in size with greater than 50% respiratory variability, suggesting right atrial pressure of 3 mmHg. IAS/Shunts: No atrial level shunt detected by color flow Doppler.  LEFT VENTRICLE PLAX 2D LVIDd:         4.30 cm   Diastology LVIDs:         2.20 cm   LV e' medial:    5.87 cm/s LV PW:         1.10 cm   LV E/e' medial:  16.9 LV IVS:        1.00 cm   LV e' lateral:   9.90 cm/s LVOT diam:  2.00 cm   LV E/e' lateral: 10.0 LV SV:         83 LV SV Index:   43 LVOT Area:     3.14 cm  RIGHT VENTRICLE             IVC RV Basal diam:  3.30 cm     IVC diam: 1.50 cm RV S prime:     14.70 cm/s TAPSE (M-mode): 2.4 cm LEFT ATRIUM             Index        RIGHT ATRIUM           Index LA diam:        3.60 cm 1.85 cm/m   RA Area:     15.00 cm LA Vol (A2C):   54.0 ml 27.78 ml/m  RA Volume:   37.10 ml  19.08 ml/m LA Vol (A4C):    47.2 ml 24.28 ml/m LA Biplane Vol: 51.2 ml 26.34 ml/m  AORTIC VALVE AV Area (Vmax):    2.83 cm AV Area (Vmean):   2.74 cm AV Area (VTI):     2.87 cm AV Vmax:           141.00 cm/s AV Vmean:          96.400 cm/s AV VTI:            0.290 m AV Peak Grad:      8.0 mmHg AV Mean Grad:      4.0 mmHg LVOT Vmax:         127.00 cm/s LVOT Vmean:        84.000 cm/s LVOT VTI:          0.265 m LVOT/AV VTI ratio: 0.91  AORTA Ao Root diam: 2.60 cm Ao Asc diam:  2.90 cm MITRAL VALVE                TRICUSPID VALVE MV Area (PHT): 3.77 cm     TR Peak grad:   19.9 mmHg MV Mean grad:  2.5 mmHg     TR Vmax:        223.00 cm/s MV Decel Time: 201 msec MV E velocity: 99.40 cm/s   SHUNTS MV A velocity: 129.00 cm/s  Systemic VTI:  0.26 m MV E/A ratio:  0.77         Systemic Diam: 2.00 cm Soyla Merck MD Electronically signed by Soyla Merck MD Signature Date/Time: 08/05/2023/2:02:51 PM    Final    MR BRAIN WO CONTRAST Result Date: 08/05/2023 CLINICAL DATA:  Follow-up examination for stroke. EXAM: MRI HEAD WITHOUT CONTRAST TECHNIQUE: Multiplanar, multiecho pulse sequences of the brain and surrounding structures were obtained without intravenous contrast. COMPARISON:  Prior CT from earlier the same day. FINDINGS: Brain: Cerebral volume within normal limits. Patchy T2/FLAIR hyperintensity involving the periventricular, deep, and subcortical white matter both cerebral hemispheres, most like related chronic microvascular ischemic disease, mild for age. No evidence for acute or subacute ischemia. Gray-white matter differentiation maintained. No acute or chronic intracranial blood products. No mass lesion, midline shift or mass effect. No hydrocephalus or extra-axial fluid collection. Pituitary gland and suprasellar region within normal limits. Vascular: Major intracranial vascular flow voids are maintained. Skull and upper cervical spine: Craniocervical junctional limits. Bone marrow signal intensity normal. No scalp soft tissue  abnormality. Sinuses/Orbits: Globes and orbital soft tissues within normal limits. Mild mucosal thickening present about the ethmoidal air cells. Paranasal sinuses are otherwise clear. No mastoid effusion. Other: None. IMPRESSION:  1. No acute intracranial abnormality. 2. Mild chronic microvascular ischemic disease for age. Electronically Signed   By: Morene Hoard M.D.   On: 08/05/2023 01:11   DG CHEST PORT 1 VIEW Result Date: 08/04/2023 CLINICAL DATA:  Chest pain, dizziness, and nausea EXAM: PORTABLE CHEST 1 VIEW COMPARISON:  None Available. FINDINGS: Single frontal view of the chest demonstrates an unremarkable cardiac silhouette. Lung volumes are diminished, with crowding the central vasculature. No acute airspace disease, effusion, or pneumothorax. No acute bony abnormality. IMPRESSION: 1. Low lung volumes.  No acute airspace disease. Electronically Signed   By: Ozell Daring M.D.   On: 08/04/2023 19:23   CT Angio Head Neck W WO CM Result Date: 08/04/2023 CLINICAL DATA:  Neuro deficit, stroke suspected. Dizziness. Patient feels like rim is pending. Nausea. EXAM: CT ANGIOGRAPHY HEAD AND NECK WITH AND WITHOUT CONTRAST TECHNIQUE: Multidetector CT imaging of the head and neck was performed using the standard protocol during bolus administration of intravenous contrast. Multiplanar CT image reconstructions and MIPs were obtained to evaluate the vascular anatomy. Carotid stenosis measurements (when applicable) are obtained utilizing NASCET criteria, using the distal internal carotid diameter as the denominator. RADIATION DOSE REDUCTION: This exam was performed according to the departmental dose-optimization program which includes automated exposure control, adjustment of the mA and/or kV according to patient size and/or use of iterative reconstruction technique. CONTRAST:  80mL OMNIPAQUE  IOHEXOL  350 MG/ML SOLN COMPARISON:  None Available. FINDINGS: CTA NECK FINDINGS Aortic arch: Atherosclerotic  calcifications are present in the distal aortic arch beyond the great vessel origins. No aneurysm or stenosis is present. Right carotid system: The right common carotid artery is within normal limits. Atherosclerotic changes are present at the proximal right ICA without significant stenosis relative to the more distal vessel. Cervical right ICA is otherwise normal. Left carotid system: The left common carotid artery is within normal limits. Irregular noncalcified plaque is present proximal left ICA without a significant stenosis relative to the more distal vessel. The more distal cervical left ICA is normal. Vertebral arteries: The vertebral arteries are codominant. Both vertebral arteries originate from the subclavian arteries. No significant stenosis is present in either vertebral artery in the neck. The vertebral body heights and alignment are normal. Skeleton: Vertebral body heights and alignment are normal. No focal osseous lesions are present. Other neck: Soft tissues the neck are otherwise unremarkable. Salivary glands are within normal limits. Thyroid  is normal. No significant adenopathy is present. No focal mucosal or submucosal lesions are present. Upper chest: A subpleural lipoma is noted at the right lung apex. Review of the MIP images confirms the above findings CTA HEAD FINDINGS Anterior circulation: The internal carotid arteries are within normal limits from the skull base to the ICA termini. The A1 and M1 segments are normal. MCA bifurcations are within normal limits. The ACA and MCA branch vessels are normal. No aneurysm is present. Posterior circulation: The vertebral arteries are codominant. Vertebrobasilar junction and basilar artery normal. The superior cerebellar arteries are patent bilaterally. The right posterior cerebral artery originates from basilar tip. The left posterior cerebral artery is of fetal type. PCA branch vessels are within normal limits bilaterally. No aneurysm is present.  Venous sinuses: The dural sinuses are patent. The straight sinus and deep cerebral veins are intact. Cortical veins are within normal limits. No significant vascular malformation is evident. Anatomic variants: Fetal type left posterior cerebral artery. Review of the MIP images confirms the above findings IMPRESSION: 1. No emergent large vessel occlusion. 2. Atherosclerotic changes  at the proximal internal carotid arteries bilaterally without significant stenosis relative to the more distal vessels. 3. Normal variant CTA Circle of Willis without significant proximal stenosis, aneurysm, or branch vessel occlusion. 4.  Aortic Atherosclerosis (ICD10-I70.0). Electronically Signed   By: Lonni Necessary M.D.   On: 08/04/2023 19:18   CT HEAD CODE STROKE WO CONTRAST Result Date: 08/04/2023 CLINICAL DATA:  Code stroke. Neuro deficit, acute, stroke suspected. Patient feels the room is spinning. She is nauseated. EXAM: CT HEAD WITHOUT CONTRAST TECHNIQUE: Contiguous axial images were obtained from the base of the skull through the vertex without intravenous contrast. RADIATION DOSE REDUCTION: This exam was performed according to the departmental dose-optimization program which includes automated exposure control, adjustment of the mA and/or kV according to patient size and/or use of iterative reconstruction technique. COMPARISON:  None Available. FINDINGS: Brain: No acute infarct, hemorrhage, or mass lesion is present. No significant white matter lesions are present. Deep brain nuclei are within normal limits. The ventricles are of normal size. No significant extraaxial fluid collection is present. The brainstem and cerebellum are within normal limits. Midline structures are within normal limits. Vascular: No hyperdense vessel or unexpected calcification. Skull: No significant extracranial soft tissue lesion is present. Calvarium is intact. No focal lytic or blastic lesions are present. Hyperostosis is noted.  Sinuses/Orbits: The paranasal sinuses and mastoid air cells are clear. The globes and orbits are within normal limits. ASPECTS [facility] Stroke Program Early CT Score) - Ganglionic level infarction (caudate, lentiform nuclei, internal capsule, insula, M1-M3 cortex): 7/7 - Supraganglionic infarction (M4-M6 cortex): 3/3 Total score (0-10 with 10 being normal): 10/10 IMPRESSION: 1. Normal head CT. 2. Aspects is 10/10. The above was relayed via text pager to Dr. Jerri on 08/04/2023 at 16:50 . Electronically Signed   By: Lonni Necessary M.D.   On: 08/04/2023 16:52    Assessment & Plan:   Problem List Items Addressed This Visit     Hypothyroidism - Primary   Chronic - not taking Rx Monitor TSH      HTN (hypertension)   We discussed compliance, wt loss On [place]forge  HCT      Relevant Orders   Comprehensive metabolic panel with GFR   Hemoglobin A1c   Lipid panel   CBC with Differential/Platelet   B12 deficiency   On B12      Relevant Orders   Comprehensive metabolic panel with GFR   Hemoglobin A1c   Lipid panel   CBC with Differential/Platelet   Vitamin D  deficiency   Vit D3 and K2 Double the amount for now.  Improve complianceVit D3 and K2       Poor compliance   Doing better      Polycythemia   Better      Relevant Orders   Comprehensive metabolic panel with GFR   Hemoglobin A1c   Lipid panel   CBC with Differential/Platelet   Hyperglycemia   Obtain hemoglobin A1c Continue with weight loss effort      Relevant Orders   Comprehensive metabolic panel with GFR   Hemoglobin A1c   Lipid panel   CBC with Differential/Platelet      Meds ordered this encounter  Medications   cephALEXin  (KEFLEX ) 500 MG capsule    Sig: Use one QID x 5 days prn bladder infection    Dispense:  40 capsule    Refill:  1      Follow-up: Return in about 3 months (around 12/16/2024) for a follow-up visit.  Marolyn Noel, MD "

## 2024-09-17 NOTE — Assessment & Plan Note (Addendum)
 Obtain hemoglobin A1c Continue with weight loss effort

## 2024-09-17 NOTE — Assessment & Plan Note (Signed)
 Doing better.

## 2024-09-17 NOTE — Assessment & Plan Note (Signed)
 On B12

## 2024-09-17 NOTE — Patient Instructions (Addendum)
 [facility] - pharmacy  Recent large-scale observational studies provide strong evidence that the shingles vaccine is associated with a reduced risk of developing dementia. It may also help slow cognitive decline in individuals who already have dementia.  Key Findings from Recent Research Multiple natural experiment studies, which leveraged unique vaccination policies to minimize bias, have consistently found a protective link:  Reduced Risk: One large study, published in [place], analyzed health records from over 280,000 older adults in [place] and found that those who received the live-attenuated shingles vaccine (Zostavax, now discontinued in the [place] ) were approximately 20% less likely to develop dementia over a seven-year follow-up period. Slower Progression: A follow-up study published in Cell indicated that for those already living with dementia, the vaccine appeared to slow the progression of the disease and reduced dementia-related deaths by nearly half over nine years. Vascular Dementia Protection: Other research presented at [facility] 2025 indicated that the vaccine lowered the risk of vascular dementia by 50%. Newer Vaccine: A separate study published in [facility] suggested that the newer, more effective recombinant vaccine (Shingrix, currently used in the [place] ) also offers significant protection against dementia, with an association of lower risk than the older vaccine type. Stronger Effect in Women: Several studies noted that the protective effect against dementia was more pronounced in women than in men, possibly due to differences in immune responses or dementia pathogenesis.  Why Might the Vaccine Help? Researchers are still working to determine the exact mechanism, but current theories center on the idea that preventing the shingles virus (varicella-zoster) from reactivating helps protect the brain:  Reduced Inflammation: The most likely explanation is that the vaccine prevents  shingles infections and subsequent inflammation in the nervous system, which is a known risk factor for neurodegenerative diseases like dementia. Immune System Modulation: It's also possible that the vaccine boosts the immune system more broadly, helping the body combat other processes related to cognitive decline.  Important Considerations Observational Data: While the evidence is strong, these findings primarily come from observational studies, which show an association, not a definitive cause-and-effect relationship. A large-scale randomized controlled trial is the gold standard for conclusive proof, but such trials are difficult to conduct for dementia due to the time and cost involved. Public Health Recommendation: The [place]. Centers for Disease Control and Prevention (CDC) already recommends the two-dose Shingrix vaccine for all healthy adults aged 68 and older primarily to prevent shingles and its painful complications. The potential added benefit for dementia prevention provides another compelling reason to get vaccinated.

## 2024-09-20 ENCOUNTER — Ambulatory Visit: Payer: Self-pay | Admitting: Internal Medicine

## 2024-10-15 NOTE — Progress Notes (Signed)
 Kayla Aguirre                                          MRN: [id number]   10/15/2024   The [facility] Quality Team Specialist reviewed this patient medical record for the purposes of chart review for care gap closure. The following were reviewed: abstraction for care gap closure-controlling blood pressure.    [facility] Quality Team

## 2024-10-23 ENCOUNTER — Other Ambulatory Visit: Payer: Self-pay | Admitting: Internal Medicine

## 2024-12-17 ENCOUNTER — Ambulatory Visit: Admitting: Internal Medicine
